# Patient Record
Sex: Male | Born: 1965
Health system: Southern US, Community
[De-identification: ages and names within clinical notes are randomized; demographics above are authoritative.]

## PROBLEM LIST (undated history)

## (undated) DIAGNOSIS — I5021 Acute systolic (congestive) heart failure: Secondary | ICD-10-CM

## (undated) DIAGNOSIS — D1779 Benign lipomatous neoplasm of other sites: Secondary | ICD-10-CM

## (undated) DIAGNOSIS — F419 Anxiety disorder, unspecified: Secondary | ICD-10-CM

## (undated) DIAGNOSIS — K259 Gastric ulcer, unspecified as acute or chronic, without hemorrhage or perforation: Secondary | ICD-10-CM

## (undated) DIAGNOSIS — M5430 Sciatica, unspecified side: Secondary | ICD-10-CM

## (undated) DIAGNOSIS — G4700 Insomnia, unspecified: Secondary | ICD-10-CM

## (undated) DIAGNOSIS — R809 Proteinuria, unspecified: Secondary | ICD-10-CM

## (undated) DIAGNOSIS — R0602 Shortness of breath: Secondary | ICD-10-CM

## (undated) DIAGNOSIS — I509 Heart failure, unspecified: Secondary | ICD-10-CM

## (undated) DIAGNOSIS — R42 Dizziness and giddiness: Secondary | ICD-10-CM

## (undated) DIAGNOSIS — I10 Essential (primary) hypertension: Secondary | ICD-10-CM

## (undated) DIAGNOSIS — K219 Gastro-esophageal reflux disease without esophagitis: Secondary | ICD-10-CM

## (undated) DIAGNOSIS — R2681 Unsteadiness on feet: Secondary | ICD-10-CM

## (undated) DIAGNOSIS — D72819 Decreased white blood cell count, unspecified: Secondary | ICD-10-CM

## (undated) DIAGNOSIS — I429 Cardiomyopathy, unspecified: Secondary | ICD-10-CM

## (undated) DIAGNOSIS — I639 Cerebral infarction, unspecified: Secondary | ICD-10-CM

## (undated) HISTORY — PX: OTHER SURGICAL HISTORY: SHX169

---

## 2003-07-26 ENCOUNTER — Emergency Department (HOSPITAL_COMMUNITY): Admission: EM | Admit: 2003-07-26 | Discharge: 2003-07-26 | Payer: Self-pay | Admitting: *Deleted

## 2003-08-03 ENCOUNTER — Encounter (HOSPITAL_COMMUNITY): Admission: RE | Admit: 2003-08-03 | Discharge: 2003-09-02 | Payer: Self-pay | Admitting: Preventative Medicine

## 2006-07-23 ENCOUNTER — Ambulatory Visit: Payer: Self-pay | Admitting: Family Medicine

## 2006-07-23 DIAGNOSIS — K219 Gastro-esophageal reflux disease without esophagitis: Secondary | ICD-10-CM

## 2006-07-23 DIAGNOSIS — I10 Essential (primary) hypertension: Secondary | ICD-10-CM | POA: Insufficient documentation

## 2006-08-20 ENCOUNTER — Ambulatory Visit: Payer: Self-pay | Admitting: Family Medicine

## 2006-08-20 DIAGNOSIS — R809 Proteinuria, unspecified: Secondary | ICD-10-CM | POA: Insufficient documentation

## 2006-08-20 LAB — CONVERTED CEMR LAB
Bilirubin Urine: NEGATIVE
Blood in Urine, dipstick: NEGATIVE
Glucose, Urine, Semiquant: NEGATIVE
Nitrite: NEGATIVE
WBC Urine, dipstick: NEGATIVE

## 2006-08-31 ENCOUNTER — Encounter (INDEPENDENT_AMBULATORY_CARE_PROVIDER_SITE_OTHER): Payer: Self-pay | Admitting: Family Medicine

## 2006-09-01 ENCOUNTER — Telehealth (INDEPENDENT_AMBULATORY_CARE_PROVIDER_SITE_OTHER): Payer: Self-pay | Admitting: *Deleted

## 2006-09-01 LAB — CONVERTED CEMR LAB
Albumin: 4.4 g/dL (ref 3.5–5.2)
Alkaline Phosphatase: 41 units/L (ref 39–117)
Basophils Relative: 0 % (ref 0–1)
CO2: 26 meq/L (ref 19–32)
Calcium: 9.5 mg/dL (ref 8.4–10.5)
Creatinine, Ser: 1.14 mg/dL (ref 0.40–1.50)
Eosinophils Absolute: 0.1 10*3/uL (ref 0.0–0.7)
Glucose, Bld: 86 mg/dL (ref 70–99)
Hemoglobin: 14.9 g/dL (ref 13.0–17.0)
MCHC: 34 g/dL (ref 30.0–36.0)
MCV: 92.2 fL (ref 78.0–100.0)
Monocytes Relative: 10 % (ref 3–11)
Neutro Abs: 1.8 10*3/uL (ref 1.7–7.7)
Neutrophils Relative %: 44 % (ref 43–77)
PSA: 0.72 ng/mL (ref 0.10–4.00)
Platelets: 225 10*3/uL (ref 150–400)
RDW: 12.8 % (ref 11.5–14.0)
TSH: 2.124 microintl units/mL (ref 0.350–5.50)
Total Bilirubin: 0.9 mg/dL (ref 0.3–1.2)
Triglycerides: 80 mg/dL (ref <150)
VLDL: 16 mg/dL (ref 0–40)
WBC: 4.2 10*3/uL (ref 4.0–10.5)

## 2006-09-02 ENCOUNTER — Encounter (INDEPENDENT_AMBULATORY_CARE_PROVIDER_SITE_OTHER): Payer: Self-pay | Admitting: Family Medicine

## 2006-09-11 ENCOUNTER — Ambulatory Visit: Payer: Self-pay | Admitting: Family Medicine

## 2006-09-11 LAB — CONVERTED CEMR LAB
HDL goal, serum: 40 mg/dL
LDL Goal: 130 mg/dL

## 2006-09-14 ENCOUNTER — Encounter (INDEPENDENT_AMBULATORY_CARE_PROVIDER_SITE_OTHER): Payer: Self-pay | Admitting: Family Medicine

## 2006-12-11 ENCOUNTER — Ambulatory Visit: Payer: Self-pay | Admitting: Family Medicine

## 2008-02-04 DIAGNOSIS — D1779 Benign lipomatous neoplasm of other sites: Secondary | ICD-10-CM

## 2008-02-04 HISTORY — DX: Benign lipomatous neoplasm of other sites: D17.79

## 2008-02-22 ENCOUNTER — Telehealth (INDEPENDENT_AMBULATORY_CARE_PROVIDER_SITE_OTHER): Payer: Self-pay | Admitting: *Deleted

## 2008-02-25 ENCOUNTER — Ambulatory Visit: Payer: Self-pay | Admitting: Family Medicine

## 2008-03-22 ENCOUNTER — Encounter (INDEPENDENT_AMBULATORY_CARE_PROVIDER_SITE_OTHER): Payer: Self-pay | Admitting: Family Medicine

## 2008-03-27 LAB — CONVERTED CEMR LAB
Alkaline Phosphatase: 50 units/L (ref 39–117)
BUN: 15 mg/dL (ref 6–23)
Basophils Relative: 0 % (ref 0–1)
Creatinine, Ser: 0.99 mg/dL (ref 0.40–1.50)
Glucose, Bld: 92 mg/dL (ref 70–99)
HDL: 47 mg/dL (ref >39)
Hemoglobin, Urine: NEGATIVE
LDL Cholesterol: 99 mg/dL (ref 0–99)
Leukocytes, UA: NEGATIVE
Lymphocytes Relative: 31 % (ref 12–46)
Lymphs Abs: 1.2 10*3/uL (ref 0.7–4.0)
MCHC: 33.2 g/dL (ref 30.0–36.0)
MCV: 95.6 fL (ref 78.0–100.0)
Monocytes Absolute: 0.4 10*3/uL (ref 0.1–1.0)
Monocytes Relative: 9 % (ref 3–12)
Nitrite: NEGATIVE
PSA: 1.37 ng/mL (ref 0.10–4.00)
Platelets: 263 10*3/uL (ref 150–400)
Protein, ur: NEGATIVE mg/dL
RDW: 13.4 % (ref 11.5–15.5)
Sodium: 139 meq/L (ref 135–145)
TSH: 0.817 microintl units/mL (ref 0.350–4.50)
Total Bilirubin: 0.9 mg/dL (ref 0.3–1.2)
Total CHOL/HDL Ratio: 3.3
Total Protein: 8.4 g/dL — ABNORMAL HIGH (ref 6.0–8.3)
Urobilinogen, UA: 0.2 (ref 0.0–1.0)
VLDL: 11 mg/dL (ref 0–40)

## 2008-03-28 ENCOUNTER — Ambulatory Visit: Payer: Self-pay | Admitting: Family Medicine

## 2008-03-28 LAB — CONVERTED CEMR LAB: LDL Goal: 160 mg/dL

## 2008-03-31 ENCOUNTER — Ambulatory Visit (HOSPITAL_COMMUNITY): Admission: RE | Admit: 2008-03-31 | Discharge: 2008-03-31 | Payer: Self-pay | Admitting: Family Medicine

## 2008-04-13 ENCOUNTER — Encounter (INDEPENDENT_AMBULATORY_CARE_PROVIDER_SITE_OTHER): Payer: Self-pay | Admitting: Family Medicine

## 2008-04-20 ENCOUNTER — Encounter: Payer: Self-pay | Admitting: Neurosurgery

## 2008-04-25 ENCOUNTER — Ambulatory Visit: Payer: Self-pay | Admitting: Family Medicine

## 2008-06-08 ENCOUNTER — Ambulatory Visit (HOSPITAL_COMMUNITY): Admission: RE | Admit: 2008-06-08 | Discharge: 2008-06-08 | Payer: Self-pay | Admitting: Interventional Radiology

## 2008-10-03 ENCOUNTER — Ambulatory Visit: Payer: Self-pay | Admitting: Family Medicine

## 2008-10-03 DIAGNOSIS — R7989 Other specified abnormal findings of blood chemistry: Secondary | ICD-10-CM | POA: Insufficient documentation

## 2008-10-03 DIAGNOSIS — D72819 Decreased white blood cell count, unspecified: Secondary | ICD-10-CM | POA: Insufficient documentation

## 2008-10-04 LAB — CONVERTED CEMR LAB
AST: 13 units/L (ref 0–37)
Albumin: 4.3 g/dL (ref 3.5–5.2)
Alkaline Phosphatase: 39 units/L (ref 39–117)
Basophils Relative: 0 % (ref 0–1)
Calcium: 9 mg/dL (ref 8.4–10.5)
Eosinophils Absolute: 0.1 10*3/uL (ref 0.0–0.7)
Eosinophils Relative: 2 % (ref 0–5)
Lymphocytes Relative: 43 % (ref 12–46)
Monocytes Absolute: 0.5 10*3/uL (ref 0.1–1.0)
Neutro Abs: 1.7 10*3/uL (ref 1.7–7.7)
Neutrophils Relative %: 42 % — ABNORMAL LOW (ref 43–77)
Platelets: 256 10*3/uL (ref 150–400)
Potassium: 4.1 meq/L (ref 3.5–5.3)
RDW: 12.6 % (ref 11.5–15.5)
Sodium: 139 meq/L (ref 135–145)
Total Bilirubin: 0.7 mg/dL (ref 0.3–1.2)
Total Protein: 7.7 g/dL (ref 6.0–8.3)
WBC: 4 10*3/uL (ref 4.0–10.5)

## 2008-10-13 ENCOUNTER — Encounter (INDEPENDENT_AMBULATORY_CARE_PROVIDER_SITE_OTHER): Payer: Self-pay | Admitting: Family Medicine

## 2009-03-18 ENCOUNTER — Emergency Department (HOSPITAL_COMMUNITY): Admission: EM | Admit: 2009-03-18 | Discharge: 2009-03-18 | Payer: Self-pay | Admitting: Emergency Medicine

## 2010-02-24 ENCOUNTER — Encounter: Payer: Self-pay | Admitting: Interventional Radiology

## 2010-04-24 LAB — BASIC METABOLIC PANEL
BUN: 13 mg/dL (ref 6–23)
Calcium: 10.3 mg/dL (ref 8.4–10.5)
GFR calc Af Amer: >60 mL/min (ref >60)
GFR calc non Af Amer: >60 mL/min (ref >60)
Glucose, Bld: 137 mg/dL — ABNORMAL HIGH (ref 70–99)
Potassium: 3.7 mEq/L (ref 3.5–5.1)

## 2010-04-24 LAB — CBC
MCHC: 34.3 g/dL (ref 30.0–36.0)
MCV: 93.5 fL (ref 78.0–100.0)
RBC: 4.97 MIL/uL (ref 4.22–5.81)
WBC: 10.7 10*3/uL — ABNORMAL HIGH (ref 4.0–10.5)

## 2010-04-24 LAB — DIFFERENTIAL
Basophils Absolute: 0 10*3/uL (ref 0.0–0.1)
Basophils Relative: 0 % (ref 0–1)
Eosinophils Absolute: 0 10*3/uL (ref 0.0–0.7)
Eosinophils Relative: 0 % (ref 0–5)
Lymphocytes Relative: 5 % — ABNORMAL LOW (ref 12–46)
Monocytes Absolute: 0.4 10*3/uL (ref 0.1–1.0)
Neutro Abs: 9.7 10*3/uL — ABNORMAL HIGH (ref 1.7–7.7)

## 2010-04-24 LAB — URINALYSIS, ROUTINE W REFLEX MICROSCOPIC
Bilirubin Urine: NEGATIVE
Ketones, ur: 15 mg/dL — AB
Nitrite: NEGATIVE
Protein, ur: 100 mg/dL — AB
Specific Gravity, Urine: >1.03 — ABNORMAL HIGH (ref 1.005–1.030)

## 2010-04-24 LAB — URINE MICROSCOPIC-ADD ON

## 2010-04-24 LAB — URINE CULTURE
Colony Count: NO GROWTH
Culture: NO GROWTH

## 2010-05-15 LAB — DIFFERENTIAL
Basophils Relative: 1 % (ref 0–1)
Eosinophils Absolute: 0 10*3/uL (ref 0.0–0.7)
Eosinophils Relative: 1 % (ref 0–5)
Neutro Abs: 1.9 10*3/uL (ref 1.7–7.7)
Neutrophils Relative %: 50 % (ref 43–77)

## 2010-05-15 LAB — BASIC METABOLIC PANEL
BUN: 12 mg/dL (ref 6–23)
Calcium: 10 mg/dL (ref 8.4–10.5)
Creatinine, Ser: 1.12 mg/dL (ref 0.4–1.5)
GFR calc Af Amer: >60 mL/min (ref >60)
GFR calc non Af Amer: >60 mL/min (ref >60)

## 2010-05-15 LAB — PROTIME-INR
INR: 1 (ref 0.00–1.49)
Prothrombin Time: 13.4 seconds (ref 11.6–15.2)

## 2010-05-15 LAB — CBC
HCT: 43.5 % (ref 39.0–52.0)
Platelets: 199 10*3/uL (ref 150–400)
RBC: 4.73 MIL/uL (ref 4.22–5.81)
RDW: 13.1 % (ref 11.5–15.5)
WBC: 3.8 10*3/uL — ABNORMAL LOW (ref 4.0–10.5)

## 2010-05-15 LAB — APTT: aPTT: 29 seconds (ref 24–37)

## 2010-06-18 NOTE — Consult Note (Signed)
Isaac Johnson                  ACCOUNT NO.:  [id number]   MEDICAL RECORD NO.:  [id number]          PATIENT TYPE:  OUT   LOCATION:  XRAY                         FACILITY:  [facility]   PHYSICIAN:  Sanjeev K. Deveshwar, M.D.DATE OF BIRTH:  11-02-1965   DATE OF CONSULTATION:  04/20/2008  DATE OF DISCHARGE:                                 CONSULTATION   CHIEF COMPLAINT:  Cerebral aneurysm.   HISTORY OF PRESENT ILLNESS:  This is a very pleasant 45 year old male  who was referred to Dr. Corliss Skains through the courtesy of Dr. Jordan Likes for  further evaluation of a cerebral aneurysm.  The patient has a history of  fatigue and gait instability times approximately 2 weeks.  He denies any  falls, but states that his balance has been poor although this does seem  to be improving.  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  The patient was referred to Dr. Jordan Likes  from Dr. Erby Pian.  Dr. Jordan Likes evaluated the patient and referred him to  Dr. Corliss Skains to discuss treatment options.   PAST MEDICAL HISTORY:  The patient has been very healthy.  He does have  hypertension which is treated with lisinopril and hydrochlorothiazide.  He has frequent indigestion and reports having a sensitive stomach,  otherwise he has been for the most part healthy.   PAST SURGICAL HISTORY:  The patient has had no major surgeries.  He has  never had general anesthesia.   ALLERGIES:  No known drug allergies.   CURRENT MEDICATIONS:  Lisinopril and hydrochlorothiazide 20/25 one  daily.   SOCIAL HISTORY:  The patient is married.  He has one daughter.  The  patient and his wife live in [place].  The patient has never smoked.  He  does not use alcohol.  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  There is some lifting involved but it is not  extremely strenuous.   FAMILY HISTORY:  His  mother is alive at age 23.  She has moyamoya  disease.  His father is alive at age 66.  He has no significant medical  illnesses.  The patient has an uncle on his mother's side of the family  who died from a ruptured aneurysm in his 42s.   IMPRESSION AND PLAN:  As noted, the patient presents today for further  discussions regarding a recently diagnosed cerebral aneurysm.  Aneurysms  were explained in detail along with the risks involved.  We also  discussed treatment options including continued monitoring versus open  craniotomy and clipping as well as endovascular treatment with coiling  and/or stenting.  The procedures were described in detail along with the  risks and benefits of each.   Dr. Corliss Skains reviewed the patient's recent MRI/MRA with the patient and  his wife.  He pointed out the areas of concern in the vertebral arteries  as well as the possible aneurysm.  Dr. Corliss Skains could not be certain  that this  did actually represent an aneurysm due to the limitations of  the study.  He also felt that the narrowing in the vertebral arteries  could actually represent artifact as well.  A cerebral angiogram has  been recommended for further evaluation.  The patient would like to  proceed with endovascular treatment if it is felt to be safe and  indicated at the time of the angiogram.  We will make these arrangements  and the patient will be scheduled sometime in April.  The patient and  his wife were given some written materials to study at home.  They were  encouraged to call if they had any further questions.  He was given a  prescription for Plavix which he will start 3 days prior to the  intervention.  He will also start aspirin 325 mg daily 3 days prior to  the intervention.  They were told to call in the interim with any  questions.   TIME SPENT:  Greater than 1 hour was spent on this consult.      Delton See, P.A.    ______________________________  Grandville Silos.  Corliss Skains, M.D.    DR/MEDQ  D:  04/20/2008  T:  04/21/2008  Job:  [id number]   cc:   Franchot Heidelberg, M.D.

## 2011-07-08 ENCOUNTER — Encounter (HOSPITAL_COMMUNITY): Payer: Self-pay | Admitting: Emergency Medicine

## 2011-07-08 ENCOUNTER — Emergency Department (HOSPITAL_COMMUNITY)
Admission: EM | Admit: 2011-07-08 | Discharge: 2011-07-09 | Disposition: A | Discharge status: Home / Self Care | Payer: BC Managed Care – PPO | Attending: Emergency Medicine | Admitting: Emergency Medicine

## 2011-07-08 DIAGNOSIS — M79609 Pain in unspecified limb: Secondary | ICD-10-CM | POA: Insufficient documentation

## 2011-07-08 DIAGNOSIS — M549 Dorsalgia, unspecified: Secondary | ICD-10-CM | POA: Insufficient documentation

## 2011-07-08 DIAGNOSIS — M5430 Sciatica, unspecified side: Secondary | ICD-10-CM

## 2011-07-08 DIAGNOSIS — I10 Essential (primary) hypertension: Secondary | ICD-10-CM | POA: Insufficient documentation

## 2011-07-08 HISTORY — DX: Essential (primary) hypertension: I10

## 2011-07-08 HISTORY — DX: Gastric ulcer, unspecified as acute or chronic, without hemorrhage or perforation: K25.9

## 2011-07-08 LAB — URINALYSIS, ROUTINE W REFLEX MICROSCOPIC
Bilirubin Urine: NEGATIVE
Ketones, ur: NEGATIVE mg/dL
Nitrite: NEGATIVE
Protein, ur: NEGATIVE mg/dL
Specific Gravity, Urine: <1.005 — ABNORMAL LOW (ref 1.005–1.030)
Urobilinogen, UA: 0.2 mg/dL (ref 0.0–1.0)
pH: 7 (ref 5.0–8.0)

## 2011-07-08 MED ORDER — PREDNISONE 20 MG PO TABS
60.0000 mg | ORAL_TABLET | Freq: Once | ORAL | Status: AC
  Administered 2011-07-08: 60 mg via ORAL
  Filled 2011-07-08: qty 3

## 2011-07-08 MED ORDER — IBUPROFEN 800 MG PO TABS
800.0000 mg | ORAL_TABLET | Freq: Once | ORAL | Status: AC
  Administered 2011-07-08: 800 mg via ORAL
  Filled 2011-07-08: qty 1

## 2011-07-08 MED ORDER — DIAZEPAM 5 MG PO TABS
5.0000 mg | ORAL_TABLET | Freq: Once | ORAL | Status: AC
  Administered 2011-07-08: 5 mg via ORAL
  Filled 2011-07-08: qty 1

## 2011-07-08 MED ORDER — IBUPROFEN 800 MG PO TABS: 800.0000 mg | ORAL_TABLET | Freq: Three times a day (TID) | ORAL | Status: AC

## 2011-07-08 MED ORDER — DIAZEPAM 5 MG PO TABS: 5.0000 mg | ORAL_TABLET | Freq: Four times a day (QID) | ORAL | Status: AC | PRN

## 2011-07-08 MED ORDER — OXYCODONE-ACETAMINOPHEN 5-325 MG PO TABS
2.0000 | ORAL_TABLET | Freq: Once | ORAL | Status: AC
  Administered 2011-07-08: 2 via ORAL
  Filled 2011-07-08: qty 2

## 2011-07-08 MED ORDER — OXYCODONE-ACETAMINOPHEN 5-325 MG PO TABS: 2.0000 | ORAL_TABLET | ORAL | Status: AC | PRN

## 2011-07-08 MED ORDER — METHYLPREDNISOLONE 4 MG PO KIT: PACK | ORAL | Status: AC

## 2011-07-08 NOTE — ED Provider Notes (Signed)
History  This chart was scribed for Isaac Octave, MD by Bennett Scrape. This patient was seen in room APA03/APA03 and the patient's care was started at 9:31PM.  CSN: [id number]  Arrival date & time 07/08/11  2103   First MD Initiated Contact with Patient 07/08/11 2131      Chief Complaint  Patient presents with  . Leg Pain  . Back Pain    The history is provided by the patient. No language interpreter was used.    Isaac Johnson is a 46 y.o. male who presents to the Emergency Department complaining of 2 days of gradual onset, gradually worsening, constant lower back pain that radiates sharp shooting pain down the left knee after lifting "something heavy". The pain is worse with movement and bending. He has tried warm showers with no improvement in his symptoms. He reports taking Aleve with mild improvement in his symptoms. He also states that he experienced two episodes of falling as he was trying to get out of bed earlier. He states that the left leg "just gave out". He reports that he experienced one prior episode last year and was diagnosed with sciatica by his PCP. He states that the pain is similar but not as severe due to the fact that last time, the pain radiated all the way down his left leg to his toes. He denies fever, emesis, loss of bowels or bladder and difficulty urinating as associated symptoms. He has a h/o HTN and stomach ulcers. He denies smoking and alcohol use.  Dr. Bradly Bienenstock is PCP.   Past Medical History  Diagnosis Date  . Hypertension   . Stomach ulcer     History reviewed. No pertinent past surgical history.  History reviewed. No pertinent family history.  History  Substance Use Topics  . Smoking status: Never Smoker   . Smokeless tobacco: Not on file  . Alcohol Use: No      Review of Systems  A complete 10 system review of systems was obtained and all systems are negative except as noted in the HPI and PMH.   Allergies  Review of patient's  allergies indicates no known allergies.  Home Medications     Triage Vitals: BP 174/98  Pulse 70  Temp(Src) 98.5 F (36.9 C) (Oral)  Resp 14  Ht 6' (1.829 m)  Wt 185 lb (83.915 kg)  BMI 25.09 kg/m2  SpO2 100%  Physical Exam  Nursing note and vitals reviewed. Constitutional: He is oriented to person, place, and time. He appears well-developed and well-nourished. No distress.  HENT:  Head: Normocephalic and atraumatic.  Eyes: Conjunctivae and EOM are normal.  Neck: Neck supple. No tracheal deviation present.  Cardiovascular: Normal rate.   Pulmonary/Chest: Effort normal. No respiratory distress.  Abdominal: Soft. He exhibits no distension.  Musculoskeletal: Normal range of motion.       Good DP and PT pulses, good dorsi and plantar flexion, good great toe flexion, +1 patellar reflex on the left, +2 on the right, left paraspinal lumbar tenderness  Neurological: He is alert and oriented to person, place, and time.  Skin: Skin is warm and dry.  Psychiatric: He has a normal mood and affect. His behavior is normal.    ED Course  Procedures (including critical care time)  DIAGNOSTIC STUDIES: Oxygen Saturation is 100% on room air, normal by my interpretation.    COORDINATION OF CARE: 9:39PM-Discussed discharge plan of antiinflammatories and steroids with pt and pt agreed to plan.   Labs Reviewed  URINALYSIS, ROUTINE W REFLEX MICROSCOPIC - Abnormal; Notable for the following:    Specific Gravity, Urine <1.005 (*)    All other components within normal limits   No results found.   No diagnosis found.    MDM  3 days of lower back pain radiating to the left leg. Previous history of sciatica. No weakness, numbness, tingling, bowel or bladder incontinence, fever, chills or vomiting.pain radiates down left lateral leg to the knee. Previous episodes have extended all the way down the leg.  5 out of 5 strength throughout, great toe dorisflexion intact, ankle plantar and  dorsiflexion intact.  +2 DP and PT pulses. +2 patellar reflex on R, +1 on L.  Normal gait. Negative post void residual.  No neuro deficits or other red flags.  Will treat for sciatica.    I personally performed the services described in this documentation, which was scribed in my presence.  The recorded information has been reviewed and considered.    Isaac Octave, MD 07/08/11 [phone]

## 2011-07-08 NOTE — ED Notes (Signed)
Patient stated he "lifted something" on Sunday and his back started hurting. Left leg pain started yesterday.

## 2011-07-08 NOTE — Discharge Instructions (Signed)

## 2011-07-08 NOTE — ED Notes (Signed)
Post void 0ml recheck by RN same

## 2012-06-23 ENCOUNTER — Encounter (HOSPITAL_COMMUNITY): Payer: Self-pay

## 2012-06-23 ENCOUNTER — Emergency Department (HOSPITAL_COMMUNITY)
Admission: EM | Admit: 2012-06-23 | Discharge: 2012-06-23 | Disposition: A | Discharge status: Home / Self Care | Payer: BC Managed Care – PPO | Attending: Emergency Medicine | Admitting: Emergency Medicine

## 2012-06-23 ENCOUNTER — Emergency Department (HOSPITAL_COMMUNITY): Payer: BC Managed Care – PPO

## 2012-06-23 DIAGNOSIS — Z862 Personal history of diseases of the blood and blood-forming organs and certain disorders involving the immune mechanism: Secondary | ICD-10-CM | POA: Insufficient documentation

## 2012-06-23 DIAGNOSIS — R109 Unspecified abdominal pain: Secondary | ICD-10-CM

## 2012-06-23 DIAGNOSIS — Z79899 Other long term (current) drug therapy: Secondary | ICD-10-CM | POA: Insufficient documentation

## 2012-06-23 DIAGNOSIS — R1084 Generalized abdominal pain: Secondary | ICD-10-CM | POA: Insufficient documentation

## 2012-06-23 DIAGNOSIS — I10 Essential (primary) hypertension: Secondary | ICD-10-CM | POA: Insufficient documentation

## 2012-06-23 DIAGNOSIS — Z8719 Personal history of other diseases of the digestive system: Secondary | ICD-10-CM | POA: Insufficient documentation

## 2012-06-23 DIAGNOSIS — Z8669 Personal history of other diseases of the nervous system and sense organs: Secondary | ICD-10-CM | POA: Insufficient documentation

## 2012-06-23 DIAGNOSIS — Z8739 Personal history of other diseases of the musculoskeletal system and connective tissue: Secondary | ICD-10-CM | POA: Insufficient documentation

## 2012-06-23 DIAGNOSIS — K219 Gastro-esophageal reflux disease without esophagitis: Secondary | ICD-10-CM | POA: Insufficient documentation

## 2012-06-23 DIAGNOSIS — Z8639 Personal history of other endocrine, nutritional and metabolic disease: Secondary | ICD-10-CM | POA: Insufficient documentation

## 2012-06-23 DIAGNOSIS — R112 Nausea with vomiting, unspecified: Secondary | ICD-10-CM | POA: Insufficient documentation

## 2012-06-23 DIAGNOSIS — Z86011 Personal history of benign neoplasm of the brain: Secondary | ICD-10-CM | POA: Insufficient documentation

## 2012-06-23 HISTORY — DX: Sciatica, unspecified side: M54.30

## 2012-06-23 HISTORY — DX: Decreased white blood cell count, unspecified: D72.819

## 2012-06-23 HISTORY — DX: Insomnia, unspecified: G47.00

## 2012-06-23 HISTORY — DX: Proteinuria, unspecified: R80.9

## 2012-06-23 HISTORY — DX: Benign lipomatous neoplasm of other sites: D17.79

## 2012-06-23 HISTORY — DX: Unsteadiness on feet: R26.81

## 2012-06-23 HISTORY — DX: Gastro-esophageal reflux disease without esophagitis: K21.9

## 2012-06-23 HISTORY — DX: Dizziness and giddiness: R42

## 2012-06-23 LAB — LIPASE, BLOOD: Lipase: 48 U/L (ref 11–59)

## 2012-06-23 LAB — CBC WITH DIFFERENTIAL/PLATELET
Basophils Absolute: 0 10*3/uL (ref 0.0–0.1)
Basophils Relative: 0 % (ref 0–1)
Eosinophils Absolute: 0 10*3/uL (ref 0.0–0.7)
HCT: 41.2 % (ref 39.0–52.0)
Hemoglobin: 14.3 g/dL (ref 13.0–17.0)
MCH: 31.4 pg (ref 26.0–34.0)
MCHC: 34.7 g/dL (ref 30.0–36.0)
Monocytes Absolute: 0.4 10*3/uL (ref 0.1–1.0)
Monocytes Relative: 10 % (ref 3–12)
RDW: 12.3 % (ref 11.5–15.5)

## 2012-06-23 LAB — COMPREHENSIVE METABOLIC PANEL
Albumin: 3.8 g/dL (ref 3.5–5.2)
BUN: 11 mg/dL (ref 6–23)
Calcium: 8.5 mg/dL (ref 8.4–10.5)
Creatinine, Ser: 1.02 mg/dL (ref 0.50–1.35)
Total Protein: 7.3 g/dL (ref 6.0–8.3)

## 2012-06-23 LAB — URINALYSIS, ROUTINE W REFLEX MICROSCOPIC
Ketones, ur: NEGATIVE mg/dL
Leukocytes, UA: NEGATIVE
Nitrite: NEGATIVE
Specific Gravity, Urine: 1.015 (ref 1.005–1.030)
pH: 7 (ref 5.0–8.0)

## 2012-06-23 LAB — URINE MICROSCOPIC-ADD ON

## 2012-06-23 MED ORDER — MORPHINE SULFATE 4 MG/ML IJ SOLN
4.0000 mg | INTRAMUSCULAR | Status: DC | PRN
  Administered 2012-06-23: 4 mg via INTRAVENOUS
  Filled 2012-06-23: qty 1

## 2012-06-23 MED ORDER — OMEPRAZOLE 20 MG PO CPDR: 20.0000 mg | DELAYED_RELEASE_CAPSULE | Freq: Every day | ORAL | Status: DC

## 2012-06-23 MED ORDER — IOHEXOL 300 MG/ML  SOLN
100.0000 mL | Freq: Once | INTRAMUSCULAR | Status: AC | PRN
  Administered 2012-06-23: 100 mL via INTRAVENOUS

## 2012-06-23 MED ORDER — PANTOPRAZOLE SODIUM 40 MG IV SOLR
40.0000 mg | Freq: Once | INTRAVENOUS | Status: AC
  Administered 2012-06-23: 40 mg via INTRAVENOUS
  Filled 2012-06-23: qty 40

## 2012-06-23 MED ORDER — SODIUM CHLORIDE 0.9 % IV SOLN
INTRAVENOUS | Status: DC
Start: 2012-06-23 — End: 2012-06-24
  Administered 2012-06-23: 19:00:00 via INTRAVENOUS

## 2012-06-23 MED ORDER — ONDANSETRON HCL 4 MG/2ML IJ SOLN
4.0000 mg | INTRAMUSCULAR | Status: DC | PRN
  Administered 2012-06-23: 4 mg via INTRAVENOUS
  Filled 2012-06-23: qty 2

## 2012-06-23 MED ORDER — FAMOTIDINE IN NACL 20-0.9 MG/50ML-% IV SOLN
20.0000 mg | Freq: Once | INTRAVENOUS | Status: AC
  Administered 2012-06-23: 20 mg via INTRAVENOUS
  Filled 2012-06-23: qty 50

## 2012-06-23 MED ORDER — IOHEXOL 300 MG/ML  SOLN
50.0000 mL | Freq: Once | INTRAMUSCULAR | Status: AC | PRN
  Administered 2012-06-23: 50 mL via ORAL

## 2012-06-23 NOTE — ED Notes (Signed)
Clint Guy RN providing patient's care at this time.

## 2012-06-23 NOTE — ED Provider Notes (Signed)
History     CSN: [id number]  Arrival date & time 06/23/12  1702   First MD Initiated Contact with Patient 06/23/12 1715      Chief Complaint  Patient presents with  . Abdominal Pain     HPI Pt was seen at 1725.   Per pt, c/o gradual onset and persistence of constant generalized abd "pain" for the past 5 days.  Has been associated with several intermittent episodes of N/V.  Describes the abd pain as "cramping." States the pain began after he "ate some pizza."  States he has not taken his Prilosec in the past 2 days because he ran out. Denies diarrhea, no fevers, no back pain, no rash, no CP/SOB, no black or blood in stools or emesis.       Past Medical History  Diagnosis Date  . Hypertension   . Stomach ulcer   . Gait instability   . Proteinuria   . GERD (gastroesophageal reflux disease)   . Insomnia   . Dizziness   . Leukopenia   . Sciatica     left  . Brain lipoma 2010    Dr. Dutch Quint    History reviewed. No pertinent past surgical history.    History  Substance Use Topics  . Smoking status: Never Smoker   . Smokeless tobacco: Not on file  . Alcohol Use: No      Review of Systems ROS: Statement: All systems negative except as marked or noted in the HPI; Constitutional: Negative for fever and chills. ; ; Eyes: Negative for eye pain, redness and discharge. ; ; ENMT: Negative for ear pain, hoarseness, nasal congestion, sinus pressure and sore throat. ; ; Cardiovascular: Negative for chest pain, palpitations, diaphoresis, dyspnea and peripheral edema. ; ; Respiratory: Negative for cough, wheezing and stridor. ; ; Gastrointestinal: +abd pain, N/V. Negative for diarrhea, blood in stool, hematemesis, jaundice and rectal bleeding. . ; ; Genitourinary: Negative for dysuria, flank pain and hematuria. ; ; Musculoskeletal: Negative for back pain and neck pain. Negative for swelling and trauma.; ; Skin: Negative for pruritus, rash, abrasions, blisters, bruising and skin lesion.; ;  Neuro: Negative for headache, lightheadedness and neck stiffness. Negative for weakness, altered level of consciousness , altered mental status, extremity weakness, paresthesias, involuntary movement, seizure and syncope.       Allergies  Review of patient's allergies indicates no known allergies.  Home Medications   Current Outpatient Rx  Name  Route  Sig  Dispense  Refill  . hydrochlorothiazide (HYDRODIURIL) 25 MG tablet   Oral   Take 25 mg by mouth daily.         [name] losartan (COZAAR) 50 MG tablet   Oral   Take 50 mg by mouth daily.         . naproxen sodium (ALEVE) 220 MG tablet   Oral   Take 220 mg by mouth 2 (two) times daily as needed. pain         . omeprazole (PRILOSEC) 20 MG capsule   Oral   Take 20 mg by mouth daily.         [name] omeprazole (PRILOSEC) 20 MG capsule   Oral   Take 1 capsule (20 mg total) by mouth daily.   15 capsule   0     BP 167/96  Pulse 65  Temp(Src) 98.5 F (36.9 C) (Oral)  Resp 18  Ht 6' (1.829 m)  Wt 168 lb 7 oz (76.403 kg)  BMI 22.84 kg/m2  SpO2  99%  Physical Exam 1730: Physical examination:  Nursing notes reviewed; Vital signs and O2 SAT reviewed;  Constitutional: Well developed, Well nourished, Well hydrated, In no acute distress; Head:  Normocephalic, atraumatic; Eyes: EOMI, PERRL, No scleral icterus; ENMT: Mouth and pharynx normal, Mucous membranes moist; Neck: Supple, Full range of motion, No lymphadenopathy; Cardiovascular: Regular rate and rhythm, No murmur, rub, or gallop; Respiratory: Breath sounds clear & equal bilaterally, No rales, rhonchi, wheezes.  Speaking full sentences with ease, Normal respiratory effort/excursion; Chest: Nontender, Movement normal; Abdomen: Soft, +mild diffuse tenderness to palp. No rebound or guarding. Nondistended, Normal bowel sounds; Genitourinary: No CVA tenderness; Extremities: Pulses normal, No tenderness, No edema, No calf edema or asymmetry.; Neuro: AA&Ox3, Major CN grossly intact.   Speech clear. No gross focal motor or sensory deficits in extremities.; Skin: Color normal, Warm, Dry.   ED Course  Procedures     MDM  MDM Reviewed: previous chart, nursing note and vitals Reviewed previous: labs Interpretation: labs, x-ray and CT scan   Results for orders placed during the hospital encounter of 06/23/12  CBC WITH DIFFERENTIAL      Result Value Range   WBC 3.7 (*) 4.0 - 10.5 K/uL   RBC 4.55  4.22 - 5.81 MIL/uL   Hemoglobin 14.3  13.0 - 17.0 g/dL   HCT 16.1  09.6 - 04.5 %   MCV 90.5  78.0 - 100.0 fL   MCH 31.4  26.0 - 34.0 pg   MCHC 34.7  30.0 - 36.0 g/dL   RDW 40.9  81.1 - 91.4 %   Platelets 196  150 - 400 K/uL   Neutrophils Relative % 54  43 - 77 %   Neutro Abs 2.0  1.7 - 7.7 K/uL   Lymphocytes Relative 36  12 - 46 %   Lymphs Abs 1.3  0.7 - 4.0 K/uL   Monocytes Relative 10  3 - 12 %   Monocytes Absolute 0.4  0.1 - 1.0 K/uL   Eosinophils Relative 0  0 - 5 %   Eosinophils Absolute 0.0  0.0 - 0.7 K/uL   Basophils Relative 0  0 - 1 %   Basophils Absolute 0.0  0.0 - 0.1 K/uL  COMPREHENSIVE METABOLIC PANEL      Result Value Range   Sodium 136  135 - 145 mEq/L   Potassium 3.5  3.5 - 5.1 mEq/L   Chloride 100  96 - 112 mEq/L   CO2 28  19 - 32 mEq/L   Glucose, Bld 95  70 - 99 mg/dL   BUN 11  6 - 23 mg/dL   Creatinine, Ser 7.82  0.50 - 1.35 mg/dL   Calcium 8.5  8.4 - 95.6 mg/dL   Total Protein 7.3  6.0 - 8.3 g/dL   Albumin 3.8  3.5 - 5.2 g/dL   AST 17  0 - 37 U/L   ALT 12  0 - 53 U/L   Alkaline Phosphatase 43  39 - 117 U/L   Total Bilirubin 1.3 (*) 0.3 - 1.2 mg/dL   GFR calc non Af Amer 85 (*) >90 mL/min   GFR calc Af Amer >90  >90 mL/min  LIPASE, BLOOD      Result Value Range   Lipase 48  11 - 59 U/L  URINALYSIS, ROUTINE W REFLEX MICROSCOPIC      Result Value Range   Color, Urine YELLOW  YELLOW   APPearance CLEAR  CLEAR   Specific Gravity, Urine 1.015  1.005 - 1.030  pH 7.0  5.0 - 8.0   Glucose, UA NEGATIVE  NEGATIVE mg/dL   Hgb urine  dipstick NEGATIVE  NEGATIVE   Bilirubin Urine NEGATIVE  NEGATIVE   Ketones, ur NEGATIVE  NEGATIVE mg/dL   Protein, ur 30 (*) NEGATIVE mg/dL   Urobilinogen, UA 0.2  0.0 - 1.0 mg/dL   Nitrite NEGATIVE  NEGATIVE   Leukocytes, UA NEGATIVE  NEGATIVE  URINE MICROSCOPIC-ADD ON      Result Value Range   WBC, UA 0-2  <3 WBC/hpf   Bacteria, UA RARE  RARE   Dg Chest 2 View 06/23/2012   *RADIOLOGY REPORT*  Clinical Data: Chest/abdominal pain, weakness  CHEST - 2 VIEW  Comparison: 06/02/2008  Findings: Lungs are essentially clear.  No focal consolidation.  No pleural effusion or pneumothorax.  Nodular opacities overlying the bilateral lower lungs likely reflect nipple shadows.  The heart is normal in size.  Mild degenerative changes of the visualized thoracolumbar spine.  IMPRESSION: No evidence of acute cardiopulmonary disease.   Original Report Authenticated By: Charline Bills, M.D.   Ct Abdomen Pelvis W Contrast 06/23/2012   *RADIOLOGY REPORT*  Clinical Data: Abdominal pain, nausea, vomiting, history hypertension, ulcer disease  CT ABDOMEN AND PELVIS WITH CONTRAST  Technique:  Multidetector CT imaging of the abdomen and pelvis was performed following the standard protocol during bolus administration of intravenous contrast. Sagittal and coronal MPR images reconstructed from axial data set.  Contrast: OMNIPAQUE IOHEXOL 300 MG/ML  SOLN Dilute oral contrast.  Comparison: 03/18/2009  Findings: Lung bases clear. Liver, spleen, pancreas, kidneys, and adrenal glands normal appearance. Tiny umbilical hernia containing fat. Normal appendix. Normal-appearing bladder ureters with mild prostatic enlargement 5.4 x 4.4 cm. Stomach and bowel loops normal appearance. No mass, adenopathy, free fluid inflammatory process. Bones unremarkable.  IMPRESSION: No acute intra-abdominal or intrapelvic abnormalities. Tiny umbilical hernia. Mild prostatic enlargement.   Original Report Authenticated By: Ulyses Southward, M.D.     Results for JEOFFREY, ELEAZER (MRN [id number]) as of 06/23/2012 21:30  Ref. Range 03/22/2008 20:48 06/02/2008 11:00 10/03/2008 23:51 03/18/2009 14:09 06/23/2012 19:52  WBC Latest Range: 4.0-10.5 K/uL 3.8 (L) 3.8 (L) 4.0 10.7 (H) 3.7 (L)    2120:  Pt has tol PO well while in the ED without N/V.  No stooling while in the ED.  Abd benign, VSS. Wants to go home now. Dx and testing d/w pt and family.  Questions answered.  Verb understanding, agreeable to d/c home with outpt f/u.          Laray Anger, DO 06/25/12 1827

## 2012-06-23 NOTE — ED Notes (Signed)
Patient able to retain po fluids 

## 2012-06-23 NOTE — ED Notes (Signed)
Complain of abdominal pain. Nausea and vomiting. States he ate pizza over the weekend and that is when the pain started

## 2012-06-24 LAB — URINE CULTURE

## 2013-05-05 ENCOUNTER — Encounter (HOSPITAL_COMMUNITY): Payer: Self-pay | Admitting: Emergency Medicine

## 2013-05-05 ENCOUNTER — Inpatient Hospital Stay (HOSPITAL_COMMUNITY)
Admission: EM | Admit: 2013-05-05 | Discharge: 2013-05-12 | DRG: 064 | Disposition: A | Discharge status: Rehabilitation Facility | Payer: BC Managed Care – PPO | Attending: Neurology | Admitting: Neurology

## 2013-05-05 ENCOUNTER — Emergency Department (HOSPITAL_COMMUNITY): Payer: BC Managed Care – PPO

## 2013-05-05 DIAGNOSIS — M6281 Muscle weakness (generalized): Secondary | ICD-10-CM | POA: Diagnosis present

## 2013-05-05 DIAGNOSIS — I639 Cerebral infarction, unspecified: Secondary | ICD-10-CM

## 2013-05-05 DIAGNOSIS — K219 Gastro-esophageal reflux disease without esophagitis: Secondary | ICD-10-CM

## 2013-05-05 DIAGNOSIS — R29810 Facial weakness: Secondary | ICD-10-CM | POA: Diagnosis present

## 2013-05-05 DIAGNOSIS — I63519 Cerebral infarction due to unspecified occlusion or stenosis of unspecified middle cerebral artery: Secondary | ICD-10-CM | POA: Diagnosis present

## 2013-05-05 DIAGNOSIS — J9600 Acute respiratory failure, unspecified whether with hypoxia or hypercapnia: Secondary | ICD-10-CM | POA: Diagnosis not present

## 2013-05-05 DIAGNOSIS — I6359 Cerebral infarction due to unspecified occlusion or stenosis of other cerebral artery: Principal | ICD-10-CM | POA: Diagnosis present

## 2013-05-05 DIAGNOSIS — E876 Hypokalemia: Secondary | ICD-10-CM | POA: Diagnosis present

## 2013-05-05 DIAGNOSIS — I10 Essential (primary) hypertension: Secondary | ICD-10-CM

## 2013-05-05 DIAGNOSIS — R471 Dysarthria and anarthria: Secondary | ICD-10-CM | POA: Diagnosis present

## 2013-05-05 DIAGNOSIS — G8190 Hemiplegia, unspecified affecting unspecified side: Secondary | ICD-10-CM | POA: Diagnosis present

## 2013-05-05 DIAGNOSIS — Z9282 Status post administration of tPA (rtPA) in a different facility within the last 24 hours prior to admission to current facility: Secondary | ICD-10-CM

## 2013-05-05 DIAGNOSIS — I428 Other cardiomyopathies: Secondary | ICD-10-CM | POA: Diagnosis present

## 2013-05-05 DIAGNOSIS — I509 Heart failure, unspecified: Secondary | ICD-10-CM

## 2013-05-05 DIAGNOSIS — I672 Cerebral atherosclerosis: Secondary | ICD-10-CM | POA: Diagnosis present

## 2013-05-05 DIAGNOSIS — I498 Other specified cardiac arrhythmias: Secondary | ICD-10-CM | POA: Diagnosis not present

## 2013-05-05 DIAGNOSIS — Z823 Family history of stroke: Secondary | ICD-10-CM

## 2013-05-05 DIAGNOSIS — I6329 Cerebral infarction due to unspecified occlusion or stenosis of other precerebral arteries: Principal | ICD-10-CM | POA: Diagnosis present

## 2013-05-05 DIAGNOSIS — F411 Generalized anxiety disorder: Secondary | ICD-10-CM | POA: Diagnosis present

## 2013-05-05 DIAGNOSIS — I519 Heart disease, unspecified: Secondary | ICD-10-CM

## 2013-05-05 DIAGNOSIS — Y849 Medical procedure, unspecified as the cause of abnormal reaction of the patient, or of later complication, without mention of misadventure at the time of the procedure: Secondary | ICD-10-CM | POA: Diagnosis not present

## 2013-05-05 DIAGNOSIS — S301XXA Contusion of abdominal wall, initial encounter: Secondary | ICD-10-CM | POA: Diagnosis not present

## 2013-05-05 DIAGNOSIS — R259 Unspecified abnormal involuntary movements: Secondary | ICD-10-CM | POA: Diagnosis present

## 2013-05-05 DIAGNOSIS — I5022 Chronic systolic (congestive) heart failure: Secondary | ICD-10-CM

## 2013-05-05 HISTORY — DX: Heart failure, unspecified: I50.9

## 2013-05-05 NOTE — ED Notes (Signed)
Pt had gotten off of work at 2230, was driving home when had onset of right arm weakness and facial droop.  Pt is unable to use right arm and has slurred speech.

## 2013-05-05 NOTE — ED Provider Notes (Signed)
CSN: [id number]     Arrival date & time 05/05/13  2346 History  This chart was scribed for Isaac Speak, MD by Elby Beck, ED Scribe. This patient was seen in room APA16A/APA16A and the patient's care was started at 11:46 PM.   Chief Complaint  Patient presents with  . Code Stroke    The history is provided by the patient. No language interpreter was used.    HPI Comments: Isaac Johnson is a 48 y.o. Male with a history of HTN brought by EMS to the Emergency Department complaining of a sudden onset of right-sided facial droop onset about 1 hour ago, around 10:30 PM, after pt got off work. Pt reports associated slurred speech and right sided weakness. He states that he is not on any anticoagulants.   Past Medical History  Diagnosis Date  . Hypertension   . Stomach ulcer   . Gait instability   . Proteinuria   . GERD (gastroesophageal reflux disease)   . Insomnia   . Dizziness   . Leukopenia   . Sciatica     left  . Brain lipoma 2010    Dr. Trenton Gammon  . CHF (congestive heart failure)    History reviewed. No pertinent past surgical history. No family history on file. History  Substance Use Topics  . Smoking status: Never Smoker   . Smokeless tobacco: Not on file  . Alcohol Use: No    Review of Systems A complete 10 system review of systems was obtained and all systems are negative except as noted in the HPI and PMH.   Allergies  Review of patient's allergies indicates no known allergies.  Home Medications   Current Outpatient Rx  Name  Route  Sig  Dispense  Refill  . hydrochlorothiazide (HYDRODIURIL) 25 MG tablet   Oral   Take 25 mg by mouth daily.         [name] losartan (COZAAR) 50 MG tablet   Oral   Take 50 mg by mouth daily.         . naproxen sodium (ALEVE) 220 MG tablet   Oral   Take 220 mg by mouth 2 (two) times daily as needed. pain         . omeprazole (PRILOSEC) 20 MG capsule   Oral   Take 20 mg by mouth daily.         [name] omeprazole (PRILOSEC) 20  MG capsule   Oral   Take 1 capsule (20 mg total) by mouth daily.   15 capsule   0    Triage Vitals: BP 176/122  Pulse 111  Temp(Src) 97.6 F (36.4 C) (Oral)  Resp 20  Ht 6' (1.829 m)  Wt 175 lb (79.379 kg)  BMI 23.73 kg/m2  SpO2 100%  Physical Exam  Nursing note and vitals reviewed. Constitutional: He is oriented to person, place, and time. He appears well-developed and well-nourished. No distress.  HENT:  Head: Normocephalic and atraumatic.  Eyes: EOM are normal.  Neck: Neck supple. No tracheal deviation present.  Cardiovascular: Normal rate.   Pulmonary/Chest: Effort normal. No respiratory distress.  Musculoskeletal: Normal range of motion.  Neurological: He is alert and oriented to person, place, and time.  There is a right-sided facial droop noted along with slurred speech. There is a right-sided hemi-plegia present.  Skin: Skin is warm and dry.  Psychiatric: He has a normal mood and affect. His behavior is normal.    ED Course  Procedures (including critical care time)  DIAGNOSTIC STUDIES: Oxygen Saturation is 100% on RA, normal by my interpretation.    COORDINATION OF CARE: 11:55 AM- Pt advised of plan for treatment and pt agrees.  Medications  alteplase (ACTIVASE) 1 mg/mL injection (not administered)  labetalol (NORMODYNE,TRANDATE) injection 10 mg (not administered)   Labs Review Labs Reviewed  COMPREHENSIVE METABOLIC PANEL - Abnormal; Notable for the following:    Potassium 3.6 (*)    Glucose, Bld 133 (*)    Creatinine, Ser 1.64 (*)    Total Bilirubin 1.8 (*)    GFR calc non Af Amer 48 (*)    GFR calc Af Amer 55 (*)    All other components within normal limits  PROTIME-INR - Abnormal; Notable for the following:    Prothrombin Time 15.3 (*)    All other components within normal limits  CBC WITH DIFFERENTIAL  APTT   Imaging Review Ct Head Wo Contrast  05/06/2013   CLINICAL DATA:  Code stroke, right arm weakness.  EXAM: CT HEAD WITHOUT CONTRAST   TECHNIQUE: Contiguous axial images were obtained from the base of the skull through the vertex without intravenous contrast.  COMPARISON:  SP ANGIO/CAR/CERV BI dated 06/08/2008; MR MRA NECK WO/W CM dated 03/31/2008; MR HEAD WO/W CM dated 03/31/2008  FINDINGS: No acute large vascular territory infarct. No hemorrhage. No midline shift or mass effect.  Colpocephaly, with partial agenesis of the corpus callosum. No hydrocephalus, apparent absent ventricular septum.  Fatty mass within the supra cerebellar cistern again seen consistent with lipoma. Cerebellar tonsils are at but not below the foramen magnum. No abnormal extra-axial fluid collections.  Paranasal sinuses and mastoid air cells appear well-aerated. No skull fracture. Ocular globes and orbital contents are unremarkable.  IMPRESSION: No acute large vascular territory infarct or hemorrhage. Please note, for evaluation of acute ischemia, MRI with diffusion-weighted sequences would be more sensitive.  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  Findings discussed with and reconfirmed by Dr.Lydiah Pong on 05/06/2013 12:00 AM.   Electronically Signed   By: Elon Alas   On: 05/06/2013 00:09     EKG Interpretation None      MDM   Final diagnoses:  None    Patient is a 48 year old male brought for evaluation of strokelike symptoms. He got off work at 10:30 this evening and was driving home when he experienced difficulty speaking, facial numbness, and weakness of the right arm and leg. He was able to call 911 who brought him here for evaluation. He states that he was normal today at work. He arrived here well within the window for thrombolytics as a code stroke.  He was immediately evaluated by myself upon arrival. Labs were drawn and he was sent immediately for a CT scan of the head. This was unremarkable and showed no contraindications for TPA. I discussed the case with Dr. Leonel Ramsay from neurology at [facility]. His  recommendations were to initiate TPA once his blood pressure was at an acceptable level. He did require 10 mg of IV labetalol to obtain this and TPA was initiated. He was then transferred to [facility] for further evaluation by neurology. An attempt was made for teleneurology, however I was unable to get in touch with them and the decision for TPA was made between myself and Dr. Leonel Ramsay.  CRITICAL CARE Performed by: Isaac Johnson Total critical care time: 60 minutes Critical care time was exclusive of separately billable procedures and treating other patients. Critical care was necessary to treat  or prevent imminent or life-threatening deterioration. Critical care was time spent personally by me on the following activities: development of treatment plan with patient and/or surrogate as well as nursing, discussions with consultants, evaluation of patient's response to treatment, examination of patient, obtaining history from patient or surrogate, ordering and performing treatments and interventions, ordering and review of laboratory studies, ordering and review of radiographic studies, pulse oximetry and re-evaluation of patient's condition.   I personally performed the services described in this documentation, which was scribed in my presence. The recorded information has been reviewed and is accurate.     Isaac Speak, MD 05/06/13 [phone]

## 2013-05-06 ENCOUNTER — Encounter (HOSPITAL_COMMUNITY): Payer: Self-pay | Admitting: Certified Registered"

## 2013-05-06 ENCOUNTER — Inpatient Hospital Stay (HOSPITAL_COMMUNITY): Payer: BC Managed Care – PPO

## 2013-05-06 DIAGNOSIS — I63519 Cerebral infarction due to unspecified occlusion or stenosis of unspecified middle cerebral artery: Secondary | ICD-10-CM | POA: Diagnosis present

## 2013-05-06 DIAGNOSIS — I6350 Cerebral infarction due to unspecified occlusion or stenosis of unspecified cerebral artery: Secondary | ICD-10-CM

## 2013-05-06 DIAGNOSIS — I517 Cardiomegaly: Secondary | ICD-10-CM

## 2013-05-06 LAB — COMPREHENSIVE METABOLIC PANEL
ALT: 43 U/L (ref 0–53)
AST: 33 U/L (ref 0–37)
Albumin: 3.7 g/dL (ref 3.5–5.2)
Alkaline Phosphatase: 46 U/L (ref 39–117)
BUN: 20 mg/dL (ref 6–23)
CALCIUM: 9.4 mg/dL (ref 8.4–10.5)
CO2: 22 meq/L (ref 19–32)
CREATININE: 1.64 mg/dL — AB (ref 0.50–1.35)
Chloride: 102 mEq/L (ref 96–112)
GFR, EST AFRICAN AMERICAN: 55 mL/min — AB (ref >90)
GFR, EST NON AFRICAN AMERICAN: 48 mL/min — AB (ref >90)
Glucose, Bld: 133 mg/dL — ABNORMAL HIGH (ref 70–99)
Potassium: 3.6 mEq/L — ABNORMAL LOW (ref 3.7–5.3)
Sodium: 140 mEq/L (ref 137–147)
Total Bilirubin: 1.8 mg/dL — ABNORMAL HIGH (ref 0.3–1.2)
Total Protein: 7.1 g/dL (ref 6.0–8.3)

## 2013-05-06 LAB — CBC WITH DIFFERENTIAL/PLATELET
BASOS ABS: 0 10*3/uL (ref 0.0–0.1)
Basophils Relative: 0 % (ref 0–1)
EOS PCT: 0 % (ref 0–5)
Eosinophils Absolute: 0 10*3/uL (ref 0.0–0.7)
HCT: 43.9 % (ref 39.0–52.0)
Hemoglobin: 15.2 g/dL (ref 13.0–17.0)
Lymphocytes Relative: 33 % (ref 12–46)
Lymphs Abs: 1.8 10*3/uL (ref 0.7–4.0)
MCH: 32.2 pg (ref 26.0–34.0)
MCHC: 34.6 g/dL (ref 30.0–36.0)
MCV: 93 fL (ref 78.0–100.0)
MONO ABS: 0.4 10*3/uL (ref 0.1–1.0)
Monocytes Relative: 8 % (ref 3–12)
Neutro Abs: 3.2 10*3/uL (ref 1.7–7.7)
Neutrophils Relative %: 59 % (ref 43–77)
Platelets: 197 10*3/uL (ref 150–400)
RBC: 4.72 MIL/uL (ref 4.22–5.81)
RDW: 13.2 % (ref 11.5–15.5)
WBC: 5.5 10*3/uL (ref 4.0–10.5)

## 2013-05-06 LAB — MRSA PCR SCREENING: MRSA by PCR: NEGATIVE

## 2013-05-06 LAB — HEMOGLOBIN A1C
Hgb A1c MFr Bld: 5.8 % — ABNORMAL HIGH (ref <5.7)
MEAN PLASMA GLUCOSE: 120 mg/dL — AB (ref <117)

## 2013-05-06 LAB — LIPID PANEL
Cholesterol: 124 mg/dL (ref 0–200)
HDL: 37 mg/dL — AB (ref >39)
LDL Cholesterol: 74 mg/dL (ref 0–99)
Total CHOL/HDL Ratio: 3.4 RATIO
Triglycerides: 63 mg/dL (ref <150)
VLDL: 13 mg/dL (ref 0–40)

## 2013-05-06 LAB — PROTIME-INR
INR: 1.24 (ref 0.00–1.49)
PROTHROMBIN TIME: 15.3 s — AB (ref 11.6–15.2)

## 2013-05-06 LAB — BASIC METABOLIC PANEL
BUN: 18 mg/dL (ref 6–23)
CO2: 19 meq/L (ref 19–32)
Calcium: 8.6 mg/dL (ref 8.4–10.5)
Chloride: 106 mEq/L (ref 96–112)
Creatinine, Ser: 1.29 mg/dL (ref 0.50–1.35)
GFR calc Af Amer: 74 mL/min — ABNORMAL LOW (ref >90)
GFR calc non Af Amer: 64 mL/min — ABNORMAL LOW (ref >90)
Glucose, Bld: 77 mg/dL (ref 70–99)
POTASSIUM: 3.9 meq/L (ref 3.7–5.3)
SODIUM: 142 meq/L (ref 137–147)

## 2013-05-06 LAB — APTT: aPTT: 27 seconds (ref 24–37)

## 2013-05-06 MED ORDER — PANTOPRAZOLE SODIUM 40 MG PO TBEC: 40.0000 mg | DELAYED_RELEASE_TABLET | Freq: Every day | ORAL | Status: DC

## 2013-05-06 MED ORDER — STROKE: EARLY STAGES OF RECOVERY BOOK
Freq: Once | Status: AC
  Administered 2013-05-06: 03:00:00
  Filled 2013-05-06: qty 1

## 2013-05-06 MED ORDER — LABETALOL HCL 5 MG/ML IV SOLN
10.0000 mg | INTRAVENOUS | Status: DC | PRN
  Administered 2013-05-06 – 2013-05-10 (×11): 10 mg via INTRAVENOUS
  Filled 2013-05-06 (×11): qty 4

## 2013-05-06 MED ORDER — FENTANYL CITRATE 0.05 MG/ML IJ SOLN
INTRAMUSCULAR | Status: AC
  Filled 2013-05-06: qty 2

## 2013-05-06 MED ORDER — ONDANSETRON HCL 4 MG/2ML IJ SOLN
INTRAMUSCULAR | Status: AC
  Filled 2013-05-06: qty 2

## 2013-05-06 MED ORDER — NITROGLYCERIN 1 MG/10 ML FOR IR/CATH LAB
INTRA_ARTERIAL | Status: AC
  Administered 2013-05-07 (×6): 25 ug
  Filled 2013-05-06: qty 10

## 2013-05-06 MED ORDER — LABETALOL HCL 5 MG/ML IV SOLN
INTRAVENOUS | Status: AC
  Filled 2013-05-06: qty 4

## 2013-05-06 MED ORDER — IOHEXOL 300 MG/ML  SOLN
150.0000 mL | Freq: Once | INTRAMUSCULAR | Status: AC | PRN
  Administered 2013-05-06: 180 mL via INTRA_ARTERIAL

## 2013-05-06 MED ORDER — ONDANSETRON HCL 4 MG/2ML IJ SOLN
4.0000 mg | Freq: Four times a day (QID) | INTRAMUSCULAR | Status: DC | PRN
Start: 2013-05-06 — End: 2013-05-12
  Administered 2013-05-06 – 2013-05-10 (×3): 4 mg via INTRAVENOUS
  Filled 2013-05-06 (×3): qty 2

## 2013-05-06 MED ORDER — ACETAMINOPHEN 325 MG PO TABS: 650.0000 mg | ORAL_TABLET | ORAL | Status: DC | PRN

## 2013-05-06 MED ORDER — ACETAMINOPHEN 650 MG RE SUPP: 650.0000 mg | RECTAL | Status: DC | PRN

## 2013-05-06 MED ORDER — ENSURE COMPLETE PO LIQD
237.0000 mL | Freq: Two times a day (BID) | ORAL | Status: DC
  Administered 2013-05-08 – 2013-05-12 (×7): 237 mL via ORAL

## 2013-05-06 MED ORDER — PANTOPRAZOLE SODIUM 40 MG IV SOLR
40.0000 mg | Freq: Every day | INTRAVENOUS | Status: DC
  Filled 2013-05-06: qty 40

## 2013-05-06 MED ORDER — ALTEPLASE 100 MG IV SOLR
INTRAVENOUS | Status: AC
  Administered 2013-05-06: 72 mg
  Filled 2013-05-06: qty 1

## 2013-05-06 MED ORDER — HYDRALAZINE HCL 20 MG/ML IJ SOLN
INTRAMUSCULAR | Status: AC
  Filled 2013-05-06: qty 1

## 2013-05-06 MED ORDER — LABETALOL HCL 5 MG/ML IV SOLN
10.0000 mg | Freq: Once | INTRAVENOUS | Status: AC
  Administered 2013-05-06: 10 mg via INTRAVENOUS
  Filled 2013-05-06: qty 4

## 2013-05-06 MED ORDER — MIDAZOLAM HCL 2 MG/2ML IJ SOLN
INTRAMUSCULAR | Status: AC
Start: 2013-05-06 — End: 2013-05-07
  Filled 2013-05-06: qty 2

## 2013-05-06 MED ORDER — FENTANYL CITRATE 0.05 MG/ML IJ SOLN
INTRAMUSCULAR | Status: AC | PRN
  Administered 2013-05-06: 25 ug via INTRAVENOUS

## 2013-05-06 MED ORDER — SODIUM CHLORIDE 0.9 % IV SOLN
INTRAVENOUS | Status: DC
  Administered 2013-05-06: 1000 mL via INTRAVENOUS

## 2013-05-06 MED ORDER — MIDAZOLAM HCL 2 MG/2ML IJ SOLN
INTRAMUSCULAR | Status: AC | PRN
  Administered 2013-05-06: 1 mg via INTRAVENOUS

## 2013-05-06 MED ORDER — SODIUM CHLORIDE 0.9 % IV BOLUS (SEPSIS)
500.0000 mL | Freq: Once | INTRAVENOUS | Status: AC
  Administered 2013-05-06: 500 mL via INTRAVENOUS

## 2013-05-06 MED ORDER — LORAZEPAM 1 MG PO TABS
1.0000 mg | ORAL_TABLET | Freq: Four times a day (QID) | ORAL | Status: DC | PRN
  Administered 2013-05-08 – 2013-05-12 (×8): 1 mg via ORAL
  Filled 2013-05-06 (×8): qty 1

## 2013-05-06 MED ORDER — LABETALOL HCL 5 MG/ML IV SOLN
5.0000 mg | Freq: Once | INTRAVENOUS | Status: AC
  Administered 2013-05-06: 5 mg via INTRAVENOUS

## 2013-05-06 MED FILL — Alteplase For Inj 100 MG: INTRAVENOUS | Qty: 72 | Status: AC

## 2013-05-06 NOTE — Progress Notes (Signed)
Spoke with Burnetta Sabin, NP and Dr. Leonie Man about pt getting ASA by midnight tonight d/t admission at [facility] being at 1146 on 4/2.  Tpa given at 0104 this am.  Dr. Leonie Man stated to have imaging done tonight and call for aspirin before midnight tonight.  Will pass along to night shift RN.  MRI scheduled at 2030.  Will continue to monitor pt.

## 2013-05-06 NOTE — Progress Notes (Signed)
VASCULAR LAB PRELIMINARY  PRELIMINARY  PRELIMINARY  PRELIMINARY  Carotid duplex  completed.    Preliminary report:  Bilateral:  1-39% ICA stenosis.  Vertebral artery flow is antegrade.      Yvett Rossel, RVT 05/06/2013, 12:53 PM
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
## 2013-05-06 NOTE — Progress Notes (Signed)
TPA finished at 0204. Line flushed with 250cc NS. Will continue to monitor patient closely.

## 2013-05-06 NOTE — Progress Notes (Signed)
  Echocardiogram 2D Echocardiogram has been performed.  [place], [facility] 05/06/2013, 10:54 AM

## 2013-05-06 NOTE — Progress Notes (Signed)
PT Cancellation Note  Patient Details Name: Isaac Johnson MRN: [id number] DOB: [birth year]   Cancelled Treatment:    Reason Eval/Treat Not Completed: Patient not medically ready.  Order for start 4/4.  Will f/u tomorrow.     Marifer Hurd, Thornton Papas 05/06/2013, 7:02 AM
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
## 2013-05-06 NOTE — Progress Notes (Signed)
Patient improved to the point that he had no residual deficits other than mild dysarthria earlier today. Then approximately 22:10 shortly after he had sudden onset return of a dense right hemiparesis. At this point, he is not a candidate for intravenous tpa due to symptoms yesterday, but with an imaging proven large vessel occlusion and sudden return of symptoms, I do think that an attempt at intra-arterial therapy would be warrented.   I have discussed this with Dr. Estanislado Pandy and he will be taken for angiogram.   Roland Rack, MD [facility] [phone]  If 7pm- 7am, please page neurology on call as listed in [place].

## 2013-05-06 NOTE — Progress Notes (Signed)
Utilization review completed. Letroy Vazguez, RN, BSN. 

## 2013-05-06 NOTE — Progress Notes (Signed)
INITIAL NUTRITION ASSESSMENT  DOCUMENTATION CODES Per approved criteria  -Not Applicable   INTERVENTION: Ensure Complete po BID, each supplement provides 350 kcal and 13 grams of protein  NUTRITION DIAGNOSIS: Unintentional weight loss related to decreased appetite as evidenced by per pt report of 3% weight loss x 1 month.   Goal: Pt to meet >/= 90% of their estimated nutrition needs   Monitor:  Diet tolerance, supplement acceptance, PO intake, weight trend  Reason for Assessment: Pt identified as at nutrition risk on the Malnutrition Screen Tool  48 y.o. male  Admitting Dx: <principal problem not specified>  ASSESSMENT: Admitted to [facility] d/t right-sided weakness, given tPA and transferred to [facility].  Pt feeling better and reports that he had started to lose weight over the last month due to decreased appetite. Pt states that instead of taking a meal to work (sandwich, chips, and drink) pt has only been eating a snack at work (chips and a drink). Pt unsure why is appetite is decreased. Pt's diet advanced this am but has not had a meal yet.  Pt has lost a small amount of weight PTA and does have some mild muscle wasting present in clavicles and temples, however this does not currently meet criteria for malnutrition.   Nutrition Focused Physical Exam:  Subcutaneous Fat:  Orbital Region: WNL Upper Arm Region: WNL Thoracic and Lumbar Region: WNL  Muscle:  Temple Region: mild wasting Clavicle Bone Region: mild wasting Clavicle and Acromion Bone Region: WNL Scapular Bone Region: WNL Dorsal Hand: WNL Patellar Region: WNL Anterior Thigh Region: WNL Posterior Calf Region: WNL  Edema: not present   Height: Ht Readings from Last 1 Encounters:  05/06/13 6' (1.829 m)    Weight: Wt Readings from Last 1 Encounters:  05/06/13 175 lb (79.379 kg)    Ideal Body Weight: 80.9 kg   % Ideal Body Weight: 98%  Wt Readings from Last 10 Encounters:  05/06/13 175 lb (79.379 kg)   06/23/12 168 lb 7 oz (76.403 kg)  07/08/11 185 lb (83.915 kg)  10/03/08 183 lb (83.008 kg)  04/25/08 183 lb (83.008 kg)  03/28/08 185 lb (83.915 kg)  02/25/08 185 lb (83.915 kg)  12/11/06 176 lb (79.833 kg)  09/11/06 183 lb (83.008 kg)  08/20/06 183 lb (83.008 kg)    Usual Body Weight: 180 lb   % Usual Body Weight: 97%  BMI:  Body mass index is 23.73 kg/(m^2).  Estimated Nutritional Needs: Kcal: 2000-2200 Protein: 90-100 grams Fluid: > 2 L/day  Skin: WNL  Diet Order: Dysphagia 3 with Thin Liquids  EDUCATION NEEDS: -No education needs identified at this time   Intake/Output Summary (Last 24 hours) at 05/06/13 1118 Last data filed at 05/06/13 0900  Gross per 24 hour  Intake    700 ml  Output    925 ml  Net   -225 ml    Last BM: PTA   Labs:   Recent Labs Lab 05/05/13 2352 05/06/13 0520  NA 140 142  K 3.6* 3.9  CL 102 106  CO2 22 19  BUN 20 18  CREATININE 1.64* 1.29  CALCIUM 9.4 8.6  GLUCOSE 133* 77    CBG (last 3)  No results found for this basename: GLUCAP,  in the last 72 hours  Scheduled Meds: . pantoprazole (PROTONIX) IV  40 mg Intravenous QHS    Continuous Infusions: . sodium chloride 100 mL/hr at 05/06/13 0900    Past Medical History  Diagnosis Date  . Hypertension   .  Stomach ulcer   . Gait instability   . Proteinuria   . GERD (gastroesophageal reflux disease)   . Insomnia   . Dizziness   . Leukopenia   . Sciatica     left  . Brain lipoma 2010    Dr. Trenton Gammon  . CHF (congestive heart failure)     History reviewed. No pertinent past surgical history.  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

## 2013-05-06 NOTE — Sedation Documentation (Signed)
Access obtained.

## 2013-05-06 NOTE — Progress Notes (Signed)
Pt started feeling nauseous and coughing after sitting up to eat dinner.  Pt stated he does this at home and it is d/t his reflux.  Called Dr. Nicole Kindred to make him aware.  4mg  Zofran ordered q6hr prn.  1mg  Ativan ordered q6hr prn.  Will continue to monitor pt.  Neurologically the same.

## 2013-05-07 ENCOUNTER — Inpatient Hospital Stay (HOSPITAL_COMMUNITY): Payer: BC Managed Care – PPO

## 2013-05-07 ENCOUNTER — Inpatient Hospital Stay: Admit: 2013-05-07 | Payer: Self-pay | Admitting: Interventional Radiology

## 2013-05-07 ENCOUNTER — Encounter (HOSPITAL_COMMUNITY)
Admission: EM | Disposition: A | Discharge status: Rehabilitation Facility | Payer: Self-pay | Source: Home / Self Care | Attending: Neurology

## 2013-05-07 ENCOUNTER — Encounter (HOSPITAL_COMMUNITY): Payer: BC Managed Care – PPO | Admitting: Certified Registered Nurse Anesthetist

## 2013-05-07 ENCOUNTER — Inpatient Hospital Stay (HOSPITAL_COMMUNITY): Payer: BC Managed Care – PPO | Admitting: Certified Registered Nurse Anesthetist

## 2013-05-07 DIAGNOSIS — Z48812 Encounter for surgical aftercare following surgery on the circulatory system: Secondary | ICD-10-CM

## 2013-05-07 HISTORY — PX: RADIOLOGY WITH ANESTHESIA: SHX6223

## 2013-05-07 LAB — POCT I-STAT 3, ART BLOOD GAS (G3+)
Acid-base deficit: 3 mmol/L — ABNORMAL HIGH (ref 0.0–2.0)
Bicarbonate: 20.3 mEq/L (ref 20.0–24.0)
O2 Saturation: 97 %
PH ART: 7.441 (ref 7.350–7.450)
TCO2: 21 mmol/L (ref 0–100)
pCO2 arterial: 29.7 mmHg — ABNORMAL LOW (ref 35.0–45.0)
pO2, Arterial: 87 mmHg (ref 80.0–100.0)

## 2013-05-07 LAB — CBC WITH DIFFERENTIAL/PLATELET
Basophils Absolute: 0 10*3/uL (ref 0.0–0.1)
Basophils Relative: 0 % (ref 0–1)
EOS PCT: 0 % (ref 0–5)
Eosinophils Absolute: 0 10*3/uL (ref 0.0–0.7)
HCT: 40.6 % (ref 39.0–52.0)
Hemoglobin: 13.5 g/dL (ref 13.0–17.0)
LYMPHS PCT: 29 % (ref 12–46)
Lymphs Abs: 1.3 10*3/uL (ref 0.7–4.0)
MCH: 31.8 pg (ref 26.0–34.0)
MCHC: 33.3 g/dL (ref 30.0–36.0)
MCV: 95.5 fL (ref 78.0–100.0)
Monocytes Absolute: 0.4 10*3/uL (ref 0.1–1.0)
Monocytes Relative: 10 % (ref 3–12)
NEUTROS ABS: 2.8 10*3/uL (ref 1.7–7.7)
NEUTROS PCT: 61 % (ref 43–77)
PLATELETS: 169 10*3/uL (ref 150–400)
RBC: 4.25 MIL/uL (ref 4.22–5.81)
RDW: 13.9 % (ref 11.5–15.5)
WBC: 4.6 10*3/uL (ref 4.0–10.5)

## 2013-05-07 LAB — BASIC METABOLIC PANEL
BUN: 14 mg/dL (ref 6–23)
CHLORIDE: 108 meq/L (ref 96–112)
CO2: 16 meq/L — AB (ref 19–32)
Calcium: 7.7 mg/dL — ABNORMAL LOW (ref 8.4–10.5)
Creatinine, Ser: 1.23 mg/dL (ref 0.50–1.35)
GFR calc Af Amer: 78 mL/min — ABNORMAL LOW (ref >90)
GFR calc non Af Amer: 67 mL/min — ABNORMAL LOW (ref >90)
GLUCOSE: 77 mg/dL (ref 70–99)
POTASSIUM: 4.6 meq/L (ref 3.7–5.3)
SODIUM: 144 meq/L (ref 137–147)

## 2013-05-07 LAB — POCT ACTIVATED CLOTTING TIME: ACTIVATED CLOTTING TIME: 149 s

## 2013-05-07 SURGERY — RADIOLOGY WITH ANESTHESIA
Anesthesia: General

## 2013-05-07 MED ORDER — ACETAMINOPHEN 500 MG PO TABS: 1000.0000 mg | ORAL_TABLET | Freq: Four times a day (QID) | ORAL | Status: DC | PRN

## 2013-05-07 MED ORDER — ONDANSETRON HCL 4 MG/2ML IJ SOLN: 4.0000 mg | Freq: Once | INTRAMUSCULAR | Status: DC | PRN

## 2013-05-07 MED ORDER — NITROGLYCERIN 1 MG/10 ML FOR IR/CATH LAB
INTRA_ARTERIAL | Status: AC
  Filled 2013-05-07: qty 10

## 2013-05-07 MED ORDER — DEXTROSE 5 % IV SOLN
10.0000 mg | INTRAVENOUS | Status: DC | PRN
  Administered 2013-05-07: 50 ug/min via INTRAVENOUS

## 2013-05-07 MED ORDER — ASPIRIN EC 325 MG PO TBEC
DELAYED_RELEASE_TABLET | ORAL | Status: AC
  Filled 2013-05-07: qty 2

## 2013-05-07 MED ORDER — ACETAMINOPHEN 650 MG RE SUPP: 650.0000 mg | Freq: Four times a day (QID) | RECTAL | Status: DC | PRN

## 2013-05-07 MED ORDER — ASPIRIN EC 325 MG PO TBEC: 650.0000 mg | DELAYED_RELEASE_TABLET | Freq: Once | ORAL | Status: DC

## 2013-05-07 MED ORDER — HEPARIN SODIUM (PORCINE) 1000 UNIT/ML IJ SOLN
INTRAMUSCULAR | Status: DC | PRN
  Administered 2013-05-07: 2000 [IU] via INTRAVENOUS

## 2013-05-07 MED ORDER — EPTIFIBATIDE 2 MG/ML IV SOLN
INTRAVENOUS | Status: AC
  Administered 2013-05-07: 1 mg
  Administered 2013-05-07: 3 mg
  Administered 2013-05-07: 1 mg
  Filled 2013-05-07: qty 10

## 2013-05-07 MED ORDER — CLOPIDOGREL BISULFATE 75 MG PO TABS
75.0000 mg | ORAL_TABLET | Freq: Every day | ORAL | Status: DC
  Administered 2013-05-07 – 2013-05-12 (×6): 75 mg via ORAL
  Filled 2013-05-07 (×7): qty 1

## 2013-05-07 MED ORDER — ONDANSETRON HCL 4 MG/2ML IJ SOLN
4.0000 mg | Freq: Four times a day (QID) | INTRAMUSCULAR | Status: DC | PRN
  Administered 2013-05-08: 4 mg via INTRAVENOUS

## 2013-05-07 MED ORDER — CLOPIDOGREL BISULFATE 75 MG PO TABS: 300.0000 mg | ORAL_TABLET | Freq: Once | ORAL | Status: DC

## 2013-05-07 MED ORDER — ROCURONIUM BROMIDE 100 MG/10ML IV SOLN
INTRAVENOUS | Status: DC | PRN
  Administered 2013-05-07 (×5): 20 mg via INTRAVENOUS
  Administered 2013-05-07: 50 mg via INTRAVENOUS

## 2013-05-07 MED ORDER — CLOPIDOGREL BISULFATE 75 MG PO TABS
ORAL_TABLET | ORAL | Status: AC
  Filled 2013-05-07: qty 4

## 2013-05-07 MED ORDER — CHLORHEXIDINE GLUCONATE 0.12 % MT SOLN
15.0000 mL | Freq: Two times a day (BID) | OROMUCOSAL | Status: DC
  Administered 2013-05-07 – 2013-05-08 (×3): 15 mL via OROMUCOSAL
  Filled 2013-05-07 (×3): qty 15

## 2013-05-07 MED ORDER — SODIUM CHLORIDE 0.9 % IV SOLN
INTRAVENOUS | Status: DC
  Administered 2013-05-07 – 2013-05-09 (×4): via INTRAVENOUS

## 2013-05-07 MED ORDER — ASPIRIN 325 MG PO TABS
325.0000 mg | ORAL_TABLET | Freq: Every day | ORAL | Status: DC
  Administered 2013-05-07 – 2013-05-12 (×5): 325 mg via ORAL
  Filled 2013-05-07 (×7): qty 1

## 2013-05-07 MED ORDER — NICARDIPINE HCL IN NACL 20-0.86 MG/200ML-% IV SOLN
5.0000 mg/h | INTRAVENOUS | Status: DC
Start: 2013-05-07 — End: 2013-05-09
  Administered 2013-05-07: 5 mg/h via INTRAVENOUS
  Filled 2013-05-07: qty 200

## 2013-05-07 MED ORDER — PROPOFOL 10 MG/ML IV EMUL
5.0000 ug/kg/min | INTRAVENOUS | Status: DC
  Administered 2013-05-07: 50 ug/kg/min via INTRAVENOUS
  Filled 2013-05-07: qty 100

## 2013-05-07 MED ORDER — SODIUM CHLORIDE 0.9 % IV BOLUS (SEPSIS)
200.0000 mL | Freq: Once | INTRAVENOUS | Status: AC
  Administered 2013-05-07: 200 mL via INTRAVENOUS

## 2013-05-07 MED ORDER — SUCCINYLCHOLINE CHLORIDE 20 MG/ML IJ SOLN
INTRAMUSCULAR | Status: DC | PRN
  Administered 2013-05-07: 120 mg via INTRAVENOUS

## 2013-05-07 MED ORDER — BIOTENE DRY MOUTH MT LIQD
15.0000 mL | Freq: Four times a day (QID) | OROMUCOSAL | Status: DC
Start: 2013-05-07 — End: 2013-05-08
  Administered 2013-05-07 – 2013-05-08 (×6): 15 mL via OROMUCOSAL

## 2013-05-07 MED ORDER — LIDOCAINE HCL (CARDIAC) 20 MG/ML IV SOLN
INTRAVENOUS | Status: DC | PRN
  Administered 2013-05-07: 40 mg via INTRAVENOUS

## 2013-05-07 MED ORDER — HYDROMORPHONE HCL PF 1 MG/ML IJ SOLN: 0.2500 mg | INTRAMUSCULAR | Status: DC | PRN

## 2013-05-07 MED ORDER — FENTANYL CITRATE 0.05 MG/ML IJ SOLN
INTRAMUSCULAR | Status: DC | PRN
  Administered 2013-05-07 (×2): 50 ug via INTRAVENOUS
  Administered 2013-05-07: 100 ug via INTRAVENOUS

## 2013-05-07 MED ORDER — ACETAMINOPHEN 650 MG RE SUPP
650.0000 mg | RECTAL | Status: DC | PRN
  Administered 2013-05-07: 650 mg via RECTAL
  Filled 2013-05-07: qty 1

## 2013-05-07 MED ORDER — PROPOFOL 10 MG/ML IV BOLUS
INTRAVENOUS | Status: DC | PRN
  Administered 2013-05-07: 50 mg via INTRAVENOUS
  Administered 2013-05-07: 150 mg via INTRAVENOUS
  Administered 2013-05-07: 50 mg via INTRAVENOUS

## 2013-05-07 MED ORDER — ARTIFICIAL TEARS OP OINT
TOPICAL_OINTMENT | OPHTHALMIC | Status: DC | PRN
  Administered 2013-05-07: 1 via OPHTHALMIC

## 2013-05-07 NOTE — Progress Notes (Signed)
Day of Surgery  Subjective: CVA L MCA revascularization with clot retrieval and stent placement in IR with Dr Estanislado Pandy On vent Moving Left  Objective: Vital signs in last 24 hours: Temp:  [96.8 F (36 C)-98.4 F (36.9 C)] 98.1 F (36.7 C) (04/04 0800) Pulse Rate:  [26-102] 89 (04/04 0900) Resp:  [0-34] 11 (04/04 0900) BP: (82-160)/(11-125) 126/75 mmHg (04/04 0900) SpO2:  [91 %-100 %] 100 % (04/04 0900) Arterial Line BP: (128-154)/(69-102) 128/69 mmHg (04/04 0900) FiO2 (%):  [40 %] 40 % (04/04 0845)    Intake/Output from previous day: 04/03 0701 - 04/04 0700 In: 2442.8 [I.V.:2442.8] Out: 2380 [Urine:2380] Intake/Output this shift: Total I/O In: 103.6 [I.V.:103.6] Out: 625 [Urine:625]  PE:  Afeb; vss Moving Left to command No movement on Rt Does nod yes/no appropriately Answers all questions with nods On vent For extubation today per RN Rt/Lt groin sheaths intact No bleeding/ no hematoma 2+ pulses B feet Labs stable  Lab Results:   Recent Labs  05/05/13 2352 05/07/13 0635  WBC 5.5 4.6  HGB 15.2 13.5  HCT 43.9 40.6  PLT 197 169   BMET  Recent Labs  05/06/13 0520 05/07/13 0635  NA 142 144  K 3.9 4.6  CL 106 108  CO2 19 16*  GLUCOSE 77 77  BUN 18 14  CREATININE 1.29 1.23  CALCIUM 8.6 7.7*   PT/INR  Recent Labs  05/05/13 2352  LABPROT 15.3*  INR 1.24   ABG  Recent Labs  05/07/13 0602  PHART 7.441  HCO3 20.3    Studies/Results: Dg Chest 2 View  05/07/2013   CLINICAL DATA:  Recent stroke  EXAM: CHEST  2 VIEW  COMPARISON:  01/20/2013  FINDINGS: Cardiac shadow is enlarged. Vascular congestion and mild pulmonary edema is noted bilaterally. No focal confluent infiltrate is seen. No sizable effusion is noted.  IMPRESSION: Vascular congestion with mild pulmonary edema.   Electronically Signed   By: Inez Catalina M.D.   On: 05/07/2013 07:10   Ct Head Wo Contrast  05/07/2013   CLINICAL DATA:  Post endovascular stent placement.  EXAM: CT HEAD  WITHOUT CONTRAST  TECHNIQUE: Contiguous axial images were obtained from the base of the skull through the vertex without intravenous contrast.  COMPARISON:  Prior catheter directed angiogram performed earlier on the same day and MRI head from 05/06/2013  FINDINGS: Metallic stent is now seen within the left M1 segment. The stent extends from the supra clinoid aspect of the left internal carotid artery to the level of the MCA bifurcation.  Previously identified ischemic infarcts involving the left basal ganglia and left MCA territory are grossly stable. No new acute large vessel territory infarct. There is no intracranial hemorrhage. No mass lesion or midline shift.  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  No extra-axial fluid collection. No hydrocephalus. Calvarium is intact. Orbital soft tissues are normal. Paranasal sinuses and mastoid air cells are clear.  IMPRESSION: 1. Interval placement of endovascular stent within the left M1 segment. 2. Stable size and distribution of scattered ischemic infarcts involving the left basal ganglia and left MCA territory, not significantly changed relative to prior MRI from 05/06/2012. 3. No acute intracranial hemorrhage or new large vessel territory infarct identified.   Electronically Signed   By: Jeannine Boga M.D.   On: 05/07/2013 06:18   Ct Head Wo Contrast  05/06/2013   CLINICAL DATA:  Code stroke, right arm weakness.  EXAM: CT HEAD WITHOUT CONTRAST  TECHNIQUE: Contiguous axial images were obtained from the base of the skull through the vertex without intravenous contrast.  COMPARISON:  SP ANGIO/CAR/CERV BI dated 06/08/2008; MR MRA NECK WO/W CM dated 03/31/2008; MR HEAD WO/W CM dated 03/31/2008  FINDINGS: No acute large vascular territory infarct. No hemorrhage. No midline shift or mass effect.  Colpocephaly, with partial agenesis of the corpus  callosum. No hydrocephalus, apparent absent ventricular septum.  Fatty mass within the supra cerebellar cistern again seen consistent with lipoma. Cerebellar tonsils are at but not below the foramen magnum. No abnormal extra-axial fluid collections.  Paranasal sinuses and mastoid air cells appear well-aerated. No skull fracture. Ocular globes and orbital contents are unremarkable.  IMPRESSION: No acute large vascular territory infarct or hemorrhage. Please note, for evaluation of acute ischemia, MRI with diffusion-weighted sequences would be more sensitive.  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  Findings discussed with and reconfirmed by [facility] DELO on 05/06/2013 12:00 AM.   Electronically Signed   By: Elon Alas   On: 05/06/2013 00:09   Mr Brain Wo Contrast  05/06/2013   CLINICAL DATA:  Stroke, 24 hr status post tPA. Right-sided weakness.  EXAM: MRI HEAD WITHOUT CONTRAST  MRA HEAD WITHOUT CONTRAST  TECHNIQUE: Multiplanar, multiecho pulse sequences of the brain and surrounding structures were obtained without intravenous contrast. Angiographic images of the head were obtained using MRA technique without contrast.  COMPARISON:  Prior CT from 05/05/2013 and MRI from 03/31/2008.  FINDINGS: MRI HEAD FINDINGS  The CSF containing spaces are within normal limits for patient age. No focal parenchymal signal abnormality is identified. 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 No other mass lesion identified. There is no midline shift, or extra-axial fluid collection. Ventricles are normal in size without evidence of hydrocephalus. The absence of the septum pellucidum noted.  There is abnormal restricted diffusion involving the left basal ganglia, most prevalent within the posterior aspect of the putamen. There is some involvement of the periventricular white matter anterior to the atrium of the left lateral  ventricle. Scattered foci of restricted diffusion also seen within the insular cortex on the left bowel as well as the left temporoparietal region (series 3, image 19, 20). Additional small focus of restricted diffusion seen within the left centrum semi ovale of the left frontoparietal region (series 3, image 25). No evidence of hemorrhagic conversion. Tiny foci of hypointense signal intensity seen within the left putamen on GRE sequence likely represent associated petechial hemorrhages.  The cervicomedullary junction is normal. Pituitary gland is within normal limits. 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 Pituitary stalk is midline. The globes and optic nerves demonstrate a normal appearance with normal signal intensity. The  The bone marrow signal intensity is normal. Calvarium is intact. Visualized upper cervical spine is within normal limits.  Scalp soft tissues are unremarkable.  Paranasal sinuses are clear.  No mastoid effusion.  MRA HEAD FINDINGS  The visualized distal cervical, petrous, cavernous, and supra clinoid segments of the internal carotid arteries are widely patent bilaterally with antegrade flow. The left A1 segment is widely patent. The right A1 segment is hypoplastic. Anterior communicating artery is normal. Anterior cerebral arteries are well opacified bilaterally.  There is focal proximal occlusion of the left M1 segment (series 6, image 71). No opacification of the left MCA territory artery branches seen distally.  The right M1 segment is well opacified with widely patent antegrade flow. Distal right MCA territory branches are within normal limits.  The left vertebral artery is dominant. Vertebral arteries are well opacified with widely patent antegrade flow. Posterior inferior cerebral arteries are within normal limits. Vertebrobasilar junction and basilar artery are normal. Posterior cerebral arteries are well opacified. Superior cerebellar and anterior inferior cerebellar  arteries are normal.  No intracranial aneurysm identified.  IMPRESSION: MRI BRAIN:  1. 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 No evidence of hemorrhagic conversion. 2. 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 MRA BRAIN:  1. Complete occlusion of the proximal left M1 segment. 2. No other proximal branch occlusion or high-grade flow-limiting stenosis identified within the intracranial circulation. 3. No intracranial aneurysm.  Critical Value/emergent results were called by telephone at the time of interpretation on 05/06/2013 at 11:00 PM to Dr. Roland Rack , who verbally acknowledged these results.   Electronically Signed   By: Jeannine Boga M.D.   On: 05/06/2013 23:13   Dg Chest Port 1 View  05/07/2013   CLINICAL DATA:  Check endotracheal tube placement  EXAM: PORTABLE CHEST - 1 VIEW  COMPARISON:  05/06/2013  FINDINGS: An endotracheal tube is now seen 5.1 cm above the carina. A nasogastric catheter is noted although the tip is coned off than the inferior aspect of the film. Proximal side hole lies within the distal esophagus. Patchy atelectatic changes are noted  IMPRESSION: Status post endotracheal tube and nasogastric catheter placement. Patchy atelectatic changes are noted bilaterally.   Electronically Signed   By: Inez Catalina M.D.   On: 05/07/2013 08:53   Dg Abd Portable 1v  05/07/2013   CLINICAL DATA:  Orogastric tube insertion  EXAM: PORTABLE ABDOMEN - 1 VIEW  COMPARISON:  None.  FINDINGS: OG tube tip is in the proximal stomach. Side port at the GE junction. Normal bowel gas pattern. Dense left lower lobe collapse/consolidation noted obscuring the left hemidiaphragm.  IMPRESSION: OG tube proximal stomach.  This could be advanced 7 cm.  Dense left base consolidation/collapse   Electronically Signed   By: Daryll Brod M.D.   On: 05/07/2013 08:51   Mr Jodene Nam Head/brain Wo Cm  05/06/2013   CLINICAL DATA:  Stroke, 24  hr status post tPA. Right-sided weakness.  EXAM: MRI HEAD WITHOUT CONTRAST  MRA HEAD WITHOUT CONTRAST  TECHNIQUE: Multiplanar, multiecho pulse sequences of the brain and surrounding structures were obtained without intravenous contrast. Angiographic images of the head were obtained using MRA technique without contrast.  COMPARISON:  Prior CT from 05/05/2013 and MRI from 03/31/2008.  FINDINGS: MRI HEAD FINDINGS  The CSF containing spaces are within normal limits for patient age. No focal parenchymal signal abnormality is identified. 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 No other mass lesion identified. There is no midline shift, or extra-axial fluid collection. Ventricles are normal in size without evidence of hydrocephalus. The absence of the septum pellucidum noted.  There is abnormal restricted diffusion involving the left basal ganglia, most prevalent within the posterior aspect of the putamen. There is some involvement of the periventricular white matter anterior to the atrium of the left lateral ventricle. Scattered foci of restricted diffusion also seen within the insular cortex on the left bowel as well as the left temporoparietal region (series 3, image 19, 20). Additional small focus of restricted diffusion seen within the left  centrum semi ovale of the left frontoparietal region (series 3, image 25). No evidence of hemorrhagic conversion. Tiny foci of hypointense signal intensity seen within the left putamen on GRE sequence likely represent associated petechial hemorrhages.  The cervicomedullary junction is normal. Pituitary gland is within normal limits. 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 Pituitary stalk is midline. The globes and optic nerves demonstrate a normal appearance with normal signal intensity. The  The bone marrow signal intensity is normal. Calvarium is intact. Visualized upper cervical spine is within  normal limits.  Scalp soft tissues are unremarkable.  Paranasal sinuses are clear.  No mastoid effusion.  MRA HEAD FINDINGS  The visualized distal cervical, petrous, cavernous, and supra clinoid segments of the internal carotid arteries are widely patent bilaterally with antegrade flow. The left A1 segment is widely patent. The right A1 segment is hypoplastic. Anterior communicating artery is normal. Anterior cerebral arteries are well opacified bilaterally.  There is focal proximal occlusion of the left M1 segment (series 6, image 71). No opacification of the left MCA territory artery branches seen distally.  The right M1 segment is well opacified with widely patent antegrade flow. Distal right MCA territory branches are within normal limits.  The left vertebral artery is dominant. Vertebral arteries are well opacified with widely patent antegrade flow. Posterior inferior cerebral arteries are within normal limits. Vertebrobasilar junction and basilar artery are normal. Posterior cerebral arteries are well opacified. Superior cerebellar and anterior inferior cerebellar arteries are normal.  No intracranial aneurysm identified.  IMPRESSION: MRI BRAIN:  1. 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 No evidence of hemorrhagic conversion. 2. 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 MRA BRAIN:  1. Complete occlusion of the proximal left M1 segment. 2. No other proximal branch occlusion or high-grade flow-limiting stenosis identified within the intracranial circulation. 3. No intracranial aneurysm.  Critical Value/emergent results were called by telephone at the time of interpretation on 05/06/2013 at 11:00 PM to Dr. Roland Rack , who verbally acknowledged these results.   Electronically Signed   By: Jeannine Boga M.D.   On: 05/06/2013 23:13    Anti-infectives: Anti-infectives   None      Assessment/Plan: s/p  Procedure(s): RADIOLOGY WITH ANESTHESIA (N/A)  CVA L MCA revascularization 4/4 am Clot retrieval and stent placement On vent; for extub today Report to Dr Estanislado Pandy   LOS: 2 days    Ranee Peasley A 05/07/2013

## 2013-05-07 NOTE — Anesthesia Preprocedure Evaluation (Addendum)
Anesthesia Evaluation  Patient identified by MRN, date of birth, ID band Patient confused    Reviewed: Allergy & Precautions, H&P , NPO status , Patient's Chart, lab work & pertinent test results  Airway Mallampati: II TM Distance: >3 FB   Mouth opening: Limited Mouth Opening  Dental  (+) Teeth Intact, Poor Dentition   Pulmonary  breath sounds clear to auscultation        Cardiovascular hypertension, Pt. on medications +CHF Rhythm:Regular Rate:Normal  Echo: EF 25%   Neuro/Psych Acute right sided weakness; CODE STROKE  Neuromuscular disease CVA    GI/Hepatic PUD, GERD-  ,  Endo/Other    Renal/GU      Musculoskeletal   Abdominal   Peds  Hematology   Anesthesia Other Findings   Reproductive/Obstetrics                         Anesthesia Physical Anesthesia Plan  ASA: IV and emergent  Anesthesia Plan: General   Post-op Pain Management:    Induction: Intravenous  Airway Management Planned: Oral ETT  Additional Equipment: Arterial line  Intra-op Plan:   Post-operative Plan:   Informed Consent: I have reviewed the patients History and Physical, chart, labs and discussed the procedure including the risks, benefits and alternatives for the proposed anesthesia with the patient or authorized representative who has indicated his/her understanding and acceptance.     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

## 2013-05-07 NOTE — Progress Notes (Signed)
Discussed MRI results and change in Neuro status with Dr. Leonel Ramsay. Advised to hold Protonix and that Aspirin would not be ordered prior to patient going to Interventional Radiology.

## 2013-05-07 NOTE — Progress Notes (Addendum)
Right 9 fr arterial sheath removed at 1140 using exoseal device by [facility]. Manual pressure applied for 10 minutes. Right groin site level 1. Sheath intact. 4 french left femoral sheath removed at 1145 using manual pressure by Melchor Amour. Left groin site level 0. Sheath intact. Pedal pulses 2+ bilaterally. Possible pseudoaneurysm in Rt groin.  Dr. Estanislado Pandy aware and has ordered an u/s of the site.  Lt groin with no hematoma.  Sites reviewed by Noah Delaine and Melchor Amour with Viona Gilmore, RN.

## 2013-05-07 NOTE — Progress Notes (Signed)
PT Cancellation Note  Patient Details Name: Isaac Johnson MRN: [id number] DOB: 1965-04-17   Cancelled Treatment:    Reason Eval/Treat Not Completed: Patient not medically ready.  Patient on bedrest following angiogram and revascularization.  MD:  Please write activity orders when appropriate for patient.  PT will be initiated at that time.  Thank you!   Despina Pole 05/07/2013, 8:32 AM Carita Pian. Sanjuana Kava, [place] Pager [phone]

## 2013-05-07 NOTE — Sedation Documentation (Signed)
Care turned over to anesthesia 

## 2013-05-07 NOTE — Progress Notes (Signed)
VASCULAR LAB PRELIMINARY  PRELIMINARY  PRELIMINARY  PRELIMINARY  Right lower extremity ultrasound to rule out pseudoaneurysm completed.    Preliminary report:  There is no obvious evidence of pseudoaneurysm or AV fistula noted in the right lower extremity.  Isaac Johnson, RVT 05/07/2013, 5:22 PM

## 2013-05-07 NOTE — Procedures (Signed)
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

## 2013-05-07 NOTE — Progress Notes (Signed)
Pt called out complaining of numbness in his left leg from the sand bag. Performed the q30 minute check early and a large hematoma was noted at the left groin site measuring ~3 cm. Pressure was applied immediately beginning at 1255. Pedal pulses remain unchanged at 2+. After 20 minutes, the site was notably softer and no definite borders were palpated for a hematoma.

## 2013-05-07 NOTE — Sedation Documentation (Signed)
Recannulization of MCA
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
## 2013-05-07 NOTE — Progress Notes (Signed)
OT Cancellation Note  Patient Details Name: WILLIA LAMPERT MRN: [id number] DOB: 1965/03/12   Cancelled Treatment:    Reason Eval/Treat Not Completed: Patient not medically ready - pt currently on bedrest.  Will initiate OT eval once activity level increased.   Darlina Rumpf [place], OTR/L [phone] 05/07/2013, 8:42 AM

## 2013-05-07 NOTE — Evaluation (Signed)
SLP Cancellation Note  Patient Details Name: Isaac Johnson MRN: [id number] DOB: 06/15/1965   Cancelled treatment:       Reason Eval/Treat Not Completed: Medical issues which prohibited therapy (pt currently on vent, will follow for readiness for evaluation)   Claudie Fisherman, [place] [facility] SLP [phone]

## 2013-05-07 NOTE — Procedures (Signed)
**Note De-Identified Teodoro Jeffreys Obfuscation** Extubation Procedure Note  Patient Details:   Name: JUANDAVID DALLMAN DOB: 01-12-1966 MRN: [id number]   Airway Documentation:     Evaluation  O2 sats: stable throughout Complications: No apparent complications Patient did tolerate procedure well. Bilateral Breath Sounds: Clear;Diminished Suctioning: Airway Yes +LEAK, NO STRIDOR NOTED Cecile Gillispie, Penni Bombard 05/07/2013, 4:50 PM

## 2013-05-07 NOTE — Sedation Documentation (Signed)
Family updated as to patient's status.per DR D

## 2013-05-07 NOTE — Transfer of Care (Signed)
Immediate Anesthesia Transfer of Care Note  Patient: Isaac Johnson  Procedure(s) Performed: Procedure(s): RADIOLOGY WITH ANESTHESIA (N/A)  Patient Location: PACU and NICU  Anesthesia Type:General  Level of Consciousness: sedated, unresponsive and Patient remains intubated per anesthesia plan  Airway & Oxygen Therapy: Patient remains intubated per anesthesia plan and Patient placed on Ventilator (see vital sign flow sheet for setting)  Post-op Assessment: Report given to PACU RN and Post -op Vital signs reviewed and stable  Post vital signs: Reviewed and stable  Complications: No apparent anesthesia complications

## 2013-05-07 NOTE — Anesthesia Procedure Notes (Signed)
Procedure Name: Intubation Date/Time: 05/07/2013 12:48 AM Performed by: Hollie Salk Z Pre-anesthesia Checklist: Patient identified, Patient being monitored, Emergency Drugs available, Timeout performed and Suction available Patient Re-evaluated:Patient Re-evaluated prior to inductionOxygen Delivery Method: Circle system utilized Preoxygenation: Pre-oxygenation with 100% oxygen Intubation Type: IV induction and Cricoid Pressure applied Ventilation: Mask ventilation without difficulty Laryngoscope Size: Mac and 3 Grade View: Grade II Tube type: Subglottic suction tube Tube size: 8.0 mm Number of attempts: 2 Airway Equipment and Method: Stylet Placement Confirmation: ETT inserted through vocal cords under direct vision,  breath sounds checked- equal and bilateral and positive ETCO2 Secured at: 8 cm Tube secured with: Tape Dental Injury: Teeth and Oropharynx as per pre-operative assessment  Comments: Poor dentition

## 2013-05-07 NOTE — Anesthesia Postprocedure Evaluation (Signed)
  Anesthesia Post-op Note  Patient: Isaac Johnson  Procedure(s) Performed: Procedure(s): RADIOLOGY WITH ANESTHESIA (N/A)  Patient Location: NICU  Anesthesia Type:General  Level of Consciousness: sedated and unresponsive  Airway and Oxygen Therapy: placed on ventilator per anesthesia plan  Post-op Pain: none  Post-op Assessment: Post-op Vital signs reviewed, Patient's Cardiovascular Status Stable, Respiratory Function Stable, Patent Airway and Pain level controlled  Post-op Vital Signs: stable  Complications: No apparent anesthesia complications

## 2013-05-07 NOTE — Progress Notes (Signed)
Chaplain gave support to pt and pt's wife and friend.  Pt stated that he has begun to have some feeling in his right arm and leg, but it is still challenging to move r arm and leg.  Chaplain offered empathic listening and prayed with all in the room.   05/07/13 1900  Clinical Encounter Type  Visited With Patient and family together  Visit Type Spiritual support  Referral From Nurse  Spiritual Encounters  Spiritual Needs Prayer;Emotional  Stress Factors  Patient Stress Factors Health changes  Family Stress Factors Health changes    Estelle June, chaplain [phone]

## 2013-05-07 NOTE — Sedation Documentation (Signed)
MD at bedside.with Pts family d/w poc

## 2013-05-08 ENCOUNTER — Inpatient Hospital Stay (HOSPITAL_COMMUNITY): Payer: BC Managed Care – PPO

## 2013-05-08 MED ORDER — CARVEDILOL 3.125 MG PO TABS
3.1250 mg | ORAL_TABLET | Freq: Two times a day (BID) | ORAL | Status: DC
  Administered 2013-05-08 – 2013-05-12 (×9): 3.125 mg via ORAL
  Filled 2013-05-08 (×11): qty 1

## 2013-05-08 MED ORDER — PANTOPRAZOLE SODIUM 40 MG PO TBEC
80.0000 mg | DELAYED_RELEASE_TABLET | Freq: Every day | ORAL | Status: DC
  Administered 2013-05-08 – 2013-05-11 (×4): 80 mg via ORAL
  Filled 2013-05-08 (×4): qty 2

## 2013-05-08 MED ORDER — ASPIRIN 300 MG RE SUPP
300.0000 mg | Freq: Once | RECTAL | Status: AC
  Administered 2013-05-08: 300 mg via RECTAL

## 2013-05-08 MED ORDER — ALUM & MAG HYDROXIDE-SIMETH 200-200-20 MG/5ML PO SUSP
15.0000 mL | Freq: Four times a day (QID) | ORAL | Status: DC | PRN
  Administered 2013-05-08 (×2): 15 mL via ORAL
  Filled 2013-05-08 (×2): qty 30
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
## 2013-05-08 NOTE — Progress Notes (Cosign Needed)
1 Day Post-Op  Subjective: CVA L MCA revascularization 4/4 early am Developed hematoma Lt groin yesterday- reduced with pressure Extubated yesterday Better today  Objective: Vital signs in last 24 hours: Temp:  [97.5 F (36.4 C)-98.8 F (37.1 C)] 98.5 F (36.9 C) (04/05 0412) Pulse Rate:  [41-100] 75 (04/05 0800) Resp:  [12-32] 14 (04/05 0800) BP: (115-160)/(62-117) 120/66 mmHg (04/05 0800) SpO2:  [97 %-100 %] 100 % (04/05 0800) Arterial Line BP: (118-124)/(58-67) 122/67 mmHg (04/04 1130)    Intake/Output from previous day: 04/04 0701 - 04/05 0700 In: 2112.2 [I.V.:2112.2] Out: 5525 [Urine:5525] Intake/Output this shift:    PE:  Afeb; vss A/O; speech clear appropiate- answers appropriate Rt face droop; Rt smile droop Left with good movement; good strength Rt hand no movement Rt leg - moves knee to command- moves leg but unable to distintly move foot B pulses 2+ Rt groin NT no hematoma; [place] neg for pseudoaneurysm Lt groin NT; no hematoma Labs stable  Lab Results:   Recent Labs  05/05/13 2352 05/07/13 0635  WBC 5.5 4.6  HGB 15.2 13.5  HCT 43.9 40.6  PLT 197 169   BMET  Recent Labs  05/06/13 0520 05/07/13 0635  NA 142 144  K 3.9 4.6  CL 106 108  CO2 19 16*  GLUCOSE 77 77  BUN 18 14  CREATININE 1.29 1.23  CALCIUM 8.6 7.7*   PT/INR  Recent Labs  05/05/13 2352  LABPROT 15.3*  INR 1.24   ABG  Recent Labs  05/07/13 0602  PHART 7.441  HCO3 20.3    Studies/Results: Dg Chest 2 View  05/07/2013   CLINICAL DATA:  Recent stroke  EXAM: CHEST  2 VIEW  COMPARISON:  01/20/2013  FINDINGS: Cardiac shadow is enlarged. Vascular congestion and mild pulmonary edema is noted bilaterally. No focal confluent infiltrate is seen. No sizable effusion is noted.  IMPRESSION: Vascular congestion with mild pulmonary edema.   Electronically Signed   By: Alcide Clever M.D.   On: 05/07/2013 07:10   Ct Head Wo Contrast  05/07/2013   CLINICAL DATA:  Post endovascular stent  placement.  EXAM: CT HEAD WITHOUT CONTRAST  TECHNIQUE: Contiguous axial images were obtained from the base of the skull through the vertex without intravenous contrast.  COMPARISON:  Prior catheter directed angiogram performed earlier on the same day and MRI head from 05/06/2013  FINDINGS: Metallic stent is now seen within the left M1 segment. The stent extends from the supra clinoid aspect of the left internal carotid artery to the level of the MCA bifurcation.  Previously identified ischemic infarcts involving the left basal ganglia and left MCA territory are grossly stable. No new acute large vessel territory infarct. There is no intracranial hemorrhage. No mass lesion or midline shift.  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  No extra-axial fluid collection. No hydrocephalus. Calvarium is intact. Orbital soft tissues are normal. Paranasal sinuses and mastoid air cells are clear.  IMPRESSION: 1. Interval placement of endovascular stent within the left M1 segment. 2. Stable size and distribution of scattered ischemic infarcts involving the left basal ganglia and left MCA territory, not significantly changed relative to prior MRI from 05/06/2012. 3. No acute intracranial hemorrhage or new large vessel territory infarct identified.   Electronically Signed   By: Rise Mu M.D.   On: 05/07/2013 06:18   Mr Brain Wo Contrast  05/06/2013  CLINICAL DATA:  Stroke, 24 hr status post tPA. Right-sided weakness.  EXAM: MRI HEAD WITHOUT CONTRAST  MRA HEAD WITHOUT CONTRAST  TECHNIQUE: Multiplanar, multiecho pulse sequences of the brain and surrounding structures were obtained without intravenous contrast. Angiographic images of the head were obtained using MRA technique without contrast.  COMPARISON:  Prior CT from 05/05/2013 and MRI from 03/31/2008.  FINDINGS: MRI HEAD FINDINGS  The CSF  containing spaces are within normal limits for patient age. No focal parenchymal signal abnormality is identified. 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 No other mass lesion identified. There is no midline shift, or extra-axial fluid collection. Ventricles are normal in size without evidence of hydrocephalus. The absence of the septum pellucidum noted.  There is abnormal restricted diffusion involving the left basal ganglia, most prevalent within the posterior aspect of the putamen. There is some involvement of the periventricular white matter anterior to the atrium of the left lateral ventricle. Scattered foci of restricted diffusion also seen within the insular cortex on the left bowel as well as the left temporoparietal region (series 3, image 19, 20). Additional small focus of restricted diffusion seen within the left centrum semi ovale of the left frontoparietal region (series 3, image 25). No evidence of hemorrhagic conversion. Tiny foci of hypointense signal intensity seen within the left putamen on GRE sequence likely represent associated petechial hemorrhages.  The cervicomedullary junction is normal. Pituitary gland is within normal limits. 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 Pituitary stalk is midline. The globes and optic nerves demonstrate a normal appearance with normal signal intensity. The  The bone marrow signal intensity is normal. Calvarium is intact. Visualized upper cervical spine is within normal limits.  Scalp soft tissues are unremarkable.  Paranasal sinuses are clear.  No mastoid effusion.  MRA HEAD FINDINGS  The visualized distal cervical, petrous, cavernous, and supra clinoid segments of the internal carotid arteries are widely patent bilaterally with antegrade flow. The left A1 segment is widely patent. The right A1 segment is hypoplastic. Anterior communicating artery is normal. Anterior cerebral  arteries are well opacified bilaterally.  There is focal proximal occlusion of the left M1 segment (series 6, image 71). No opacification of the left MCA territory artery branches seen distally.  The right M1 segment is well opacified with widely patent antegrade flow. Distal right MCA territory branches are within normal limits.  The left vertebral artery is dominant. Vertebral arteries are well opacified with widely patent antegrade flow. Posterior inferior cerebral arteries are within normal limits. Vertebrobasilar junction and basilar artery are normal. Posterior cerebral arteries are well opacified. Superior cerebellar and anterior inferior cerebellar arteries are normal.  No intracranial aneurysm identified.  IMPRESSION: MRI BRAIN:  1. 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 No evidence of hemorrhagic conversion. 2. 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 MRA BRAIN:  1. Complete occlusion of the proximal left M1 segment. 2. No other proximal branch occlusion or high-grade flow-limiting stenosis identified within the intracranial circulation. 3. No intracranial aneurysm.  Critical Value/emergent results were called by telephone at the time of interpretation on 05/06/2013 at 11:00 PM to Dr. Roland Rack , who verbally acknowledged these results.   Electronically Signed   By: Jeannine Boga M.D.   On: 05/06/2013 23:13   Dg Chest Port 1 View  05/07/2013   CLINICAL DATA:  Check endotracheal tube placement  EXAM:  PORTABLE CHEST - 1 VIEW  COMPARISON:  05/06/2013  FINDINGS: An endotracheal tube is now seen 5.1 cm above the carina. A nasogastric catheter is noted although the tip is coned off than the inferior aspect of the film. Proximal side hole lies within the distal esophagus. Patchy atelectatic changes are noted  IMPRESSION: Status post endotracheal tube and nasogastric catheter placement. Patchy atelectatic changes are  noted bilaterally.   Electronically Signed   By: Inez Catalina M.D.   On: 05/07/2013 08:53   Dg Abd Portable 1v  05/07/2013   CLINICAL DATA:  Orogastric tube insertion  EXAM: PORTABLE ABDOMEN - 1 VIEW  COMPARISON:  None.  FINDINGS: OG tube tip is in the proximal stomach. Side port at the GE junction. Normal bowel gas pattern. Dense left lower lobe collapse/consolidation noted obscuring the left hemidiaphragm.  IMPRESSION: OG tube proximal stomach.  This could be advanced 7 cm.  Dense left base consolidation/collapse   Electronically Signed   By: Daryll Brod M.D.   On: 05/07/2013 08:51   Mr Jodene Nam Head/brain Wo Cm  05/06/2013   CLINICAL DATA:  Stroke, 24 hr status post tPA. Right-sided weakness.  EXAM: MRI HEAD WITHOUT CONTRAST  MRA HEAD WITHOUT CONTRAST  TECHNIQUE: Multiplanar, multiecho pulse sequences of the brain and surrounding structures were obtained without intravenous contrast. Angiographic images of the head were obtained using MRA technique without contrast.  COMPARISON:  Prior CT from 05/05/2013 and MRI from 03/31/2008.  FINDINGS: MRI HEAD FINDINGS  The CSF containing spaces are within normal limits for patient age. No focal parenchymal signal abnormality is identified. 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 No other mass lesion identified. There is no midline shift, or extra-axial fluid collection. Ventricles are normal in size without evidence of hydrocephalus. The absence of the septum pellucidum noted.  There is abnormal restricted diffusion involving the left basal ganglia, most prevalent within the posterior aspect of the putamen. There is some involvement of the periventricular white matter anterior to the atrium of the left lateral ventricle. Scattered foci of restricted diffusion also seen within the insular cortex on the left bowel as well as the left temporoparietal region (series 3, image 19, 20). Additional small focus  of restricted diffusion seen within the left centrum semi ovale of the left frontoparietal region (series 3, image 25). No evidence of hemorrhagic conversion. Tiny foci of hypointense signal intensity seen within the left putamen on GRE sequence likely represent associated petechial hemorrhages.  The cervicomedullary junction is normal. Pituitary gland is within normal limits. 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 Pituitary stalk is midline. The globes and optic nerves demonstrate a normal appearance with normal signal intensity. The  The bone marrow signal intensity is normal. Calvarium is intact. Visualized upper cervical spine is within normal limits.  Scalp soft tissues are unremarkable.  Paranasal sinuses are clear.  No mastoid effusion.  MRA HEAD FINDINGS  The visualized distal cervical, petrous, cavernous, and supra clinoid segments of the internal carotid arteries are widely patent bilaterally with antegrade flow. The left A1 segment is widely patent. The right A1 segment is hypoplastic. Anterior communicating artery is normal. Anterior cerebral arteries are well opacified bilaterally.  There is focal proximal occlusion of the left M1 segment (series 6, image 71). No opacification of the left MCA territory artery branches seen distally.  The right M1 segment is well opacified with widely patent antegrade flow. Distal right MCA territory branches  are within normal limits.  The left vertebral artery is dominant. Vertebral arteries are well opacified with widely patent antegrade flow. Posterior inferior cerebral arteries are within normal limits. Vertebrobasilar junction and basilar artery are normal. Posterior cerebral arteries are well opacified. Superior cerebellar and anterior inferior cerebellar arteries are normal.  No intracranial aneurysm identified.  IMPRESSION: MRI BRAIN:  1. 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 No evidence  of hemorrhagic conversion. 2. 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 MRA BRAIN:  1. Complete occlusion of the proximal left M1 segment. 2. No other proximal branch occlusion or high-grade flow-limiting stenosis identified within the intracranial circulation. 3. No intracranial aneurysm.  Critical Value/emergent results were called by telephone at the time of interpretation on 05/06/2013 at 11:00 PM to Dr. Roland Rack , who verbally acknowledged these results.   Electronically Signed   By: Jeannine Boga M.D.   On: 05/06/2013 23:13    Anti-infectives: Anti-infectives   None      Assessment/Plan: s/p Procedure(s): RADIOLOGY WITH ANESTHESIA (N/A)  CVA L MCA revascularization in IR 4/4 Will follow Continue ASA/Plavix after swallow study Will report to Dr Estanislado Pandy   LOS: 3 days    Griffin Dewilde A 05/08/2013
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
## 2013-05-08 NOTE — Progress Notes (Signed)
OT Cancellation Note  Patient Details Name: Isaac Johnson MRN: [id number] DOB: Sep 27, 1965   Cancelled Treatment:    Reason Eval/Treat Not Completed: Patient not medically ready - pt remains on bedrest.  Will reattempt tomorrow.  Darlina Rumpf [place], OTR/L [phone] 05/08/2013, 9:55 AM

## 2013-05-08 NOTE — Progress Notes (Signed)
PT Cancellation Note  Patient Details Name: JANIS CUFFE MRN: [id number] DOB: 30-Jul-1965   Cancelled Treatment:    Reason Eval/Treat Not Completed: Patient not medically ready. Patient remains on bedrest per orders.  Will return at later time for PT eval as appropriate.  MD:  Please write activity orders when appropriate for patient.  Thank you.   Despina Pole 05/08/2013, 8:06 AM Carita Pian. Sanjuana Kava, [place] Pager [phone] \

## 2013-05-09 ENCOUNTER — Encounter (HOSPITAL_COMMUNITY): Payer: Self-pay | Admitting: Interventional Radiology

## 2013-05-09 DIAGNOSIS — I509 Heart failure, unspecified: Secondary | ICD-10-CM

## 2013-05-09 DIAGNOSIS — I5022 Chronic systolic (congestive) heart failure: Secondary | ICD-10-CM

## 2013-05-09 LAB — BASIC METABOLIC PANEL
BUN: 9 mg/dL (ref 6–23)
CHLORIDE: 105 meq/L (ref 96–112)
CO2: 22 meq/L (ref 19–32)
Calcium: 7.4 mg/dL — ABNORMAL LOW (ref 8.4–10.5)
Creatinine, Ser: 1.05 mg/dL (ref 0.50–1.35)
GFR calc Af Amer: >90 mL/min (ref >90)
GFR calc non Af Amer: 82 mL/min — ABNORMAL LOW (ref >90)
GLUCOSE: 85 mg/dL (ref 70–99)
POTASSIUM: 3.3 meq/L — AB (ref 3.7–5.3)
SODIUM: 141 meq/L (ref 137–147)

## 2013-05-09 MED ORDER — FUROSEMIDE 20 MG PO TABS
20.0000 mg | ORAL_TABLET | Freq: Every day | ORAL | Status: DC
  Administered 2013-05-09 – 2013-05-12 (×4): 20 mg via ORAL
  Filled 2013-05-09 (×4): qty 1

## 2013-05-09 MED ORDER — SPIRONOLACTONE 12.5 MG HALF TABLET
12.5000 mg | ORAL_TABLET | Freq: Every day | ORAL | Status: DC
  Administered 2013-05-09 – 2013-05-12 (×4): 12.5 mg via ORAL
  Filled 2013-05-09 (×4): qty 1

## 2013-05-09 MED ORDER — LOSARTAN POTASSIUM 25 MG PO TABS
25.0000 mg | ORAL_TABLET | Freq: Every day | ORAL | Status: DC
  Administered 2013-05-09 – 2013-05-12 (×4): 25 mg via ORAL
  Filled 2013-05-09 (×4): qty 1

## 2013-05-09 MED ORDER — POTASSIUM CHLORIDE 20 MEQ/15ML (10%) PO LIQD
40.0000 meq | Freq: Once | ORAL | Status: AC
  Administered 2013-05-09: 40 meq via ORAL
  Filled 2013-05-09: qty 30
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
## 2013-05-09 NOTE — Progress Notes (Signed)
Report called and met RN at bedside.  Patient transferred to 4N22 from 7Z83 without complication. His family and belongings traveled with him.

## 2013-05-09 NOTE — Progress Notes (Signed)
Patient arrived via wheelchair to 4N.  3MW Rn and this RN transferred patient to the bed from the wheelchair and he is a 2+ person assist.  Patient oriented to room and 4N policies, given something to drink and family was updated in room.  Will monitor patient. Kizzie Bane, RN

## 2013-05-09 NOTE — Progress Notes (Signed)
OT Cancellation Note  Patient Details Name: Isaac Johnson MRN: [id number] DOB: 11-26-1965   Cancelled Treatment:    Reason Eval/Treat Not Completed: Patient not medically ready - pt remains on bedrest.  Will initiate OT eval once activity levels increased by MD  Darlina Rumpf [place], OTR/L [phone] 05/09/2013, 10:11 AM

## 2013-05-09 NOTE — Evaluation (Signed)
Physical Therapy Evaluation Patient Details Name: Isaac Johnson MRN: [id number] DOB: 26-Apr-1965 Today's Date: 05/09/2013   History of Present Illness  WOODFIN KISS is a 48 y.o. male with a history of hypertension who presented with right sided weakness 05/05/2013. [name] He was taken to [facility] emergency department where he was seen to have severe right-sided weakness. TPA was initiated at [facility] and he was transferred to [facility] for further management. MRI/MRAI: Scattered acute ischemic infarcts involving the left basal ganglia and left MCA territory; Complete occlusion of the proximal left M1 segment. Underwent L MCA revascularization 4/4 early am  Clinical Impression  Patient presents with problems listed below.  Will benefit from acute PT to maximize functional mobility prior to discharge.  Patient was independent/working pta.  Currently has deficits with mobility, ADL's, cognition.  Recommend Inpatient Rehab consult for comprehensive therapies to facilitate return to highest functional level possible.    Follow Up Recommendations CIR    Equipment Recommendations  Wheelchair (measurements PT);Wheelchair cushion (measurements PT)    Recommendations for Other Services Rehab consult     Precautions / Restrictions Precautions Precautions: Fall Precaution Comments: Rt hemiparesis Restrictions Weight Bearing Restrictions: No      Mobility  Bed Mobility Overal bed mobility: Needs Assistance;+ 2 for safety/equipment Bed Mobility: Rolling;Sidelying to Sit Rolling: Min assist;Mod assist (Min A to right; Mod A to left) Sidelying to sit: Mod assist;+2 for safety/equipment       General bed mobility comments: Verbal and tactile cues for technique.  Patient able to roll to Rt side with use of rail with LUE.  Assist to bring hips over.  When rolling to left, verbal cues to use Lt hand to bring RUE across body to roll.  Assisted patient to flex RLE and use to assist with rolling.  Patient having  difficulty engaging Rt trunk muscles to assist with rolling - required mod assist to move to Lt side.  Transfers Overall transfer level: Needs assistance Equipment used: 2 person hand held assist Transfers: Sit to/from [facility] Sit to Stand: Mod assist;+2 physical assistance Stand pivot transfers: Max assist;+2 physical assistance       General transfer comment: Patient with decreased control of RLE in stance - provided assist at Rt knee.  Patient leaning to right with trunk rotated to right.  Cues to stand upright.  Required max assist to attempt to pivot to chair.  Difficulty pivoting with LLE - unable to support weight on RLE.  Ambulation/Gait                Stairs            Wheelchair Mobility    Modified Rankin (Stroke Patients Only) Modified Rankin (Stroke Patients Only) Pre-Morbid Rankin Score: No symptoms Modified Rankin: Severe disability     Balance Overall balance assessment: Needs assistance Sitting-balance support: Single extremity supported;Feet supported Sitting balance-Leahy Scale: Fair Sitting balance - Comments: sitting EOC while reaching with Lt. UE   Standing balance support: Single extremity supported Standing balance-Leahy Scale: Poor                               Pertinent Vitals/Pain     Home Living Family/patient expects to be discharged to:: Inpatient rehab Living Arrangements: Spouse/significant other;Children (66 yo daughter) Available Help at Discharge: Family;Available PRN/intermittently (Wife works second shift) Type of Home: House Home Access: Stairs to enter Entrance Stairs-Rails: None Entrance [employer]  of Steps: 3 Home Layout: One level Home Equipment: None      Prior Function Level of Independence: Independent         Comments: 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       Hand Dominance   Dominant Hand: Right    Extremity/Trunk Assessment   Upper Extremity  Assessment: Defer to OT evaluation RUE Deficits / Details: Pt with trace movement elbow flexion, shoulder extension.  0/5 all other movements   RUE Sensation: decreased light touch     Lower Extremity Assessment: RLE deficits/detail RLE Deficits / Details: Strength 2+/5 at hip and knee; trace PF; no DF    Cervical / Trunk Assessment: Other exceptions  Communication   Communication: Expressive difficulties (dysarthria)  Cognition Arousal/Alertness: Awake/alert Behavior During Therapy: Flat affect Overall Cognitive Status: Impaired/Different from baseline Area of Impairment: Problem solving   Current Attention Level: Selective         Problem Solving: Slow processing;Difficulty sequencing;Requires verbal cues      General Comments General comments (skin integrity, edema, etc.): Pt fatigued with OT states he did not sleep last pm    Exercises        Assessment/Plan    PT Assessment Patient needs continued PT services  PT Diagnosis Difficulty walking;Hemiplegia dominant side;Altered mental status   PT Problem List Decreased strength;Decreased activity tolerance;Decreased balance;Decreased mobility;Decreased cognition;Decreased coordination;Decreased knowledge of use of DME;Decreased safety awareness;Cardiopulmonary status limiting activity;Impaired tone;Impaired sensation  PT Treatment Interventions DME instruction;Gait training;Functional mobility training;Balance training;Neuromuscular re-education;Cognitive remediation;Patient/family education   PT Goals (Current goals can be found in the Care Plan section) Acute Rehab PT Goals Patient Stated Goal: To get better PT Goal Formulation: With patient Time For Goal Achievement: 05/23/13 Potential to Achieve Goals: Good    Frequency Min 4X/week   Barriers to discharge Decreased caregiver support Wife works 2nd shift    Co-evaluation               End of [place] During Treatment: Gait  belt Activity Tolerance: Patient limited by fatigue Patient left: in chair;with call bell/phone within reach Nurse Communication: Mobility status         Time: 1146-1202 PT Time Calculation (min): 16 min   Charges:   PT Evaluation $Initial PT Evaluation Tier I: 1 Procedure PT Treatments $Therapeutic Activity: 8-22 mins   PT G Codes:          Despina Pole 05/09/2013, 2:01 PM Carita Pian. Sanjuana Kava, [place] Pager [phone]

## 2013-05-09 NOTE — Consult Note (Signed)
Physical Medicine and Rehabilitation Consult  Reason for Consult: Right sided weakness Referring Physician: Dr. Pearlean Brownie   HPI: Isaac Johnson is a [name] with history of CHF, stomach ulcers, sciatica LLE,  brain lipoma; who was admitted to [facility] on 05/06/13 with right side weakness. He was treated with tPA and transferred to [facility] for further treatment. CT head negative for acute changes.   2D echo with EF 25% with diffuse hypokinesis and grade 2 diastolic dysfunction. Carotid dopplers without significant ICA stenosis. Patient with improvement after tPA .  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 Corliss Skains.  He was evaluated by Dr. Shirlee Latch who who recommends heart cath prior to discharge to rule out CAD as cause of CM and expressed concerns that CVA could be cardioembolic due to low EF.  Medications adjusted and . He was started on ASA and plavix for secondary stroke prevention given stent in place. He has had high levels of anxiety with SOB (hyperventilating?) requiring ativan. CTA of chest negative for PE but with evidence of nodal prominence of questionable etiology.  Therapy evaluations completed and CIR recommended by rehab team.    Review of Systems  HENT: Negative for hearing loss.   Eyes: Negative for blurred vision and double vision.  Respiratory: Positive for shortness of breath (with activity).   Cardiovascular: Negative for chest pain and palpitations.  Gastrointestinal: Positive for heartburn. Negative for nausea and vomiting.  Genitourinary: Negative for urgency and  frequency.  Musculoskeletal:       LLE pain with occasional instability.  Neurological: Positive for sensory change, speech change, focal weakness and weakness. Negative for headaches.  Psychiatric/Behavioral: Positive for depression. The patient is nervous/anxious.     Past Medical History  Diagnosis Date  . Hypertension   . Stomach ulcer   . Gait instability   . Proteinuria   . GERD (gastroesophageal reflux disease)   . Insomnia   . Dizziness   . Leukopenia   . Sciatica     left  . Brain lipoma 2010    Dr. Dutch Quint  . CHF (congestive heart failure)     Past Surgical History  Procedure Laterality Date  . Radiology with anesthesia N/A 05/07/2013    Procedure: RADIOLOGY WITH ANESTHESIA;  Surgeon: Oneal Grout, MD;  Location: [facility] OR;  Service: Radiology;  Laterality: N/A;    Family History  Problem Relation Age of Onset  . Stroke Mother 82  . Gout Father     Social History:   Married. Wife works. Has a 71 year old daughter at home. Works as a Designer, industrial/product,  He reports that he has never smoked. He does not have any smokeless tobacco history on file. He reports that he does not drink alcohol or use illicit drugs.  Allergies: No Known Allergies  Medications Prior to Admission  Medication Sig Dispense Refill  . carvedilol (COREG) 3.125 MG tablet Take 3.125 mg by mouth 2 (two) times daily with a meal.      . furosemide (LASIX) 20 MG tablet Take 20 mg by mouth daily.      [name]  ibuprofen (ADVIL,MOTRIN) 200 MG tablet Take 200 mg by mouth daily as needed (pain).      [name] losartan (COZAAR) 50 MG tablet Take 50 mg by mouth daily.      [name] omeprazole (PRILOSEC) 20 MG capsule Take 20 mg by mouth daily.        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   Functional Status:  Mobility: Bed Mobility Overal bed mobility: Needs Assistance;+ 2 for safety/equipment Bed Mobility: Rolling;Sidelying to Sit Rolling: Min assist;Mod assist (Min A to right; Mod A to left) Sidelying to sit: Mod assist;+2 for safety/equipment General bed mobility comments: Verbal and tactile cues for technique.  Patient able to roll to Rt side with use of rail with LUE.  Assist to bring hips over.  When rolling to left, verbal cues to use Lt hand to bring RUE across body to roll.  Assisted patient to flex RLE and use to assist with rolling.  Patient having difficulty engaging Rt trunk muscles to assist with rolling - required mod assist to move to Lt side. Transfers Overall transfer level: Needs assistance Equipment used: 2 person hand held assist Transfers: Sit to/from [facility] Sit to Stand: Mod assist;+2 physical assistance Stand pivot transfers: Max assist;+2 physical assistance General transfer comment: Patient with decreased control of RLE in stance - provided assist at Rt knee.  Patient leaning to right with trunk rotated to right.  Cues to stand upright.  Required max assist to attempt to pivot to chair.  Difficulty pivoting with LLE - unable to support weight on RLE.      ADL: ADL Overall ADL's : Needs assistance/impaired Eating/Feeding: Supervision/ safety;Set up;Sitting Grooming: Oral care;Wash/dry face;Wash/dry hands;Brushing hair;Supervision/safety;Sitting Grooming Details (indicate cue type and reason): using Lt. UE (is Rt. hand dominant) Upper Body Bathing: Moderate assistance;Sitting Lower Body Bathing: Moderate assistance;Sit to/from stand Upper Body  Dressing : Moderate assistance;Sitting Lower Body Dressing: Maximal assistance;Sit to/from stand Toilet Transfer: Maximal assistance;Stand-pivot;BSC Toileting- Clothing Manipulation and Hygiene: Total assistance;Sit to/from stand Functional mobility during ADLs: Moderate assistance (sit to stand) General ADL Comments: Pt able to recognize and use grooming objects appropriately.  Requires assist due to Rt. hemiplegia and impaired balance.   Cognition: Cognition Overall Cognitive Status: Impaired/Different from baseline Arousal/Alertness: Awake/alert Orientation Level: Oriented X4 Attention: Alternating Alternating Attention: Impaired Alternating Attention Impairment: Verbal complex;Functional basic;Functional complex Memory: Impaired Memory Impairment: Decreased recall of new information Awareness: Appears intact Problem Solving: Impaired Problem Solving Impairment: Verbal complex;Functional complex Executive Function: Writer: Impaired Organizing Impairment: Verbal complex Safety/Judgment: Appears intact Cognition Arousal/Alertness: Awake/alert Behavior During Therapy: Flat affect Overall Cognitive Status: Impaired/Different from baseline Area of Impairment: Problem solving Current Attention Level: Selective Problem Solving: Slow processing;Difficulty sequencing;Requires verbal cues  Blood pressure 149/100, pulse 100, temperature 97.9 F (36.6 C), temperature source Oral, resp. rate 18, height 6' (1.829 m), weight 77.3 kg (170 lb 6.7 oz), SpO2 100.00%. Physical Exam  Nursing note and vitals reviewed. Constitutional: He is oriented to person, place, and time. He appears well-developed and well-nourished.  HENT:  Head: Normocephalic and atraumatic.  Poor dentition.  Eyes: Conjunctivae are normal. Pupils are equal, round, and reactive to light.  Neck: Normal range of motion. Neck supple.  Cardiovascular: Normal rate and regular rhythm.   No murmur  heard. Respiratory: Effort normal and breath sounds normal. No respiratory distress. He has no wheezes.  GI: Soft. Bowel sounds are normal. He exhibits no distension. There is no tenderness.  Musculoskeletal: He exhibits no edema and no tenderness.  Neurological: He is alert and oriented to person, place, and time.  Poor awareness of posture with tendency to slump to the left. Speech soft but clear. Ocasionally hyperventilating due to anxiety. Able to follow basic commands without difficulty. Right hemiparesis UE>LE. Mild sensory deficits on right side. Pt with right central 7 and tongue deviation. Speech dysarthric. Sensation 1/2 right arm,leg but can sense pain. RUE with trace pecs but otherwise 0/5 RUE. RLE tr 1 at HF,KE and KE/KF, 0/5 at ankle. DTR's 1+. No resting tone.  Skin: Skin is warm and dry.  Psychiatric:  Flat, ?depressed    Results for orders placed during the hospital encounter of 05/05/13 (from the past 24 hour(s))  BASIC METABOLIC PANEL     Status: Abnormal   Collection Time    05/10/13  2:49 AM      Result Value Ref Range   Sodium 139  137 - 147 mEq/L   Potassium 3.2 (*) 3.7 - 5.3 mEq/L   Chloride 105  96 - 112 mEq/L   CO2 17 (*) 19 - 32 mEq/L   Glucose, Bld 167 (*) 70 - 99 mg/dL   BUN 12  6 - 23 mg/dL   Creatinine, Ser 1.09  0.50 - 1.35 mg/dL   Calcium 7.3 (*) 8.4 - 10.5 mg/dL   GFR calc non Af Amer 78 (*) >90 mL/min   GFR calc Af Amer >90  >90 mL/min  MAGNESIUM     Status: None   Collection Time    05/10/13  2:49 AM      Result Value Ref Range   Magnesium 1.8  1.5 - 2.5 mg/dL  BLOOD GAS, ARTERIAL     Status: Abnormal   Collection Time    05/10/13  3:01 AM      Result Value Ref Range   FIO2 0.32     O2 Content 3.0     Delivery systems NO CHARGE     pH, Arterial 7.416  7.350 - 7.450   pCO2 arterial 32.4 (*) 35.0 - 45.0 mmHg   pO2, Arterial 165.0 (*) 80.0 - 100.0 mmHg   Bicarbonate 20.4  20.0 - 24.0 mEq/L   TCO2 21.4  0 - 100 mmol/L   Acid-base deficit 3.4  (*) 0.0 - 2.0 mmol/L   O2 Saturation 99.2     Patient temperature 98.6     Collection site RIGHT RADIAL     Drawn by [id number]     Sample type ARTERIAL DRAW     Allens test (pass/fail) PASS  PASS   Ct Angio Chest Pe W/cm &/or Wo Cm  05/10/2013   CLINICAL DATA:  Decreased O2 saturation.  Tachycardia.  EXAM: CT ANGIOGRAPHY CHEST WITH CONTRAST  TECHNIQUE: Multidetector CT imaging of the chest was performed using the standard protocol during bolus administration of intravenous contrast. Multiplanar CT image reconstructions and MIPs were obtained to evaluate the vascular anatomy.  CONTRAST:  171mL OMNIPAQUE IOHEXOL 350 MG/ML SOLN  COMPARISON:  Chest radiograph performed 05/07/2013  FINDINGS: There is no evidence of pulmonary embolus.  Small bilateral pleural effusions are  noted. Mild interstitial prominence raises concern for mild interstitial edema. Mild left basilar atelectasis is seen. There is no evidence of pneumothorax. No masses are identified; no abnormal focal contrast enhancement is seen.  The heart is enlarged. Prominent bilateral hilar nodes are seen, measuring up to 1.7 cm on the right and 1.2 cm on the left. These are of uncertain significance. Subcarinal and azygoesophageal recess nodes measure up to 1.4 cm in short axis. The mediastinum is otherwise unremarkable in appearance. Trace pericardial fluid remains within normal limits. The great vessels are grossly unremarkable in appearance. No axillary lymphadenopathy is seen. The visualized portions of the thyroid gland are unremarkable in appearance.  The visualized portions of the liver and spleen are unremarkable.  No acute osseous abnormalities are seen.  Review of the MIP images confirms the above findings.  IMPRESSION: 1. No evidence of pulmonary embolus. 2. Small bilateral pleural effusions noted. Mild interstitial prominence raises concern for mild interstitial edema. 3. Mild left basilar atelectasis seen. 4. Cardiomegaly noted. 5. Prominent  mediastinal and bilateral hilar nodes, of uncertain significance. These measure up to 1.4 cm at the subcarinal region and 1.7 cm at the right hilum. Would correlate for evidence of underlying systemic process.   Electronically Signed   By: Garald Balding M.D.   On: 05/10/2013 03:31   Mr Jodene Nam Head Wo Contrast  05/08/2013   CLINICAL DATA:  Cerebral ischemia, status post endovascular treatment. Right-sided weakness.  EXAM: MRI HEAD WITHOUT CONTRAST  MRA HEAD WITHOUT CONTRAST  TECHNIQUE: Multiplanar, multiecho pulse sequences of the brain and surrounding structures were obtained without intravenous contrast. Angiographic images of the head were obtained using MRA technique without contrast.  COMPARISON:  CT HEAD W/O CM dated 05/07/2013; IR ANGIO INTRA EXTRACRAN SEL INTERNAL CAROTID UNI L MOD SED dated 05/06/2013; CT HEAD W/O CM dated 05/05/2013; MR HEAD W/O CM dated 05/06/2013; SP ANGIO/CAR/CERV BI dated 06/08/2008  FINDINGS: MRI HEAD FINDINGS  Slight progression of left MCA territory infarction, now involving more of the basal ganglia, including the caudate, putamen, and inferior globus pallidus as compared to the pretreatment MR 05/06/2013. Also new scattered areas of involvement of the left frontal cortex and subcortical white matter, left posterior temporal cortex, and left insular region. Gradient sequence demonstrates no significant hemorrhagic transformation. No midline shift.  Unchanged collicular plate lipoma. Negative orbits, sinuses, and mastoids.  MRA HEAD FINDINGS  There is gross patency of both internal carotid arteries. The left is dominant. Both anterior cerebrals fill from the left due to an atretic right A1 ACA. Fusiform prominence left ICA terminus appears similar to prior angiographic films. Basilar artery widely patent with both vertebrals contributing. No right MCA stenosis or occlusion. No intracranial aneurysm. Both posterior cerebral arteries are widely patent.  There is no visible flow related  enhancement of the left MCA proximally or distally. The left MCA proximally appears to have re-occluded.  IMPRESSION: Slight progression of intracranial multifocal left MCA territory infarcts, as described above. No hemorrhage or midline shift.  No appreciable flow related enhancement in the left MCA proximally or distally, suspect re-occlusion of the left MCA ; see discussion above.   Electronically Signed   By: Rolla Flatten M.D.   On: 05/08/2013 10:11   Mr Brain Wo Contrast  05/08/2013   CLINICAL DATA:  Cerebral ischemia, status post endovascular treatment. Right-sided weakness.  EXAM: MRI HEAD WITHOUT CONTRAST  MRA HEAD WITHOUT CONTRAST  TECHNIQUE: Multiplanar, multiecho pulse sequences of the brain and surrounding structures were obtained  without intravenous contrast. Angiographic images of the head were obtained using MRA technique without contrast.  COMPARISON:  CT HEAD W/O CM dated 05/07/2013; IR ANGIO INTRA EXTRACRAN SEL INTERNAL CAROTID UNI L MOD SED dated 05/06/2013; CT HEAD W/O CM dated 05/05/2013; MR HEAD W/O CM dated 05/06/2013; SP ANGIO/CAR/CERV BI dated 06/08/2008  FINDINGS: MRI HEAD FINDINGS  Slight progression of left MCA territory infarction, now involving more of the basal ganglia, including the caudate, putamen, and inferior globus pallidus as compared to the pretreatment MR 05/06/2013. Also new scattered areas of involvement of the left frontal cortex and subcortical white matter, left posterior temporal cortex, and left insular region. Gradient sequence demonstrates no significant hemorrhagic transformation. No midline shift.  Unchanged collicular plate lipoma. Negative orbits, sinuses, and mastoids.  MRA HEAD FINDINGS  There is gross patency of both internal carotid arteries. The left is dominant. Both anterior cerebrals fill from the left due to an atretic right A1 ACA. Fusiform prominence left ICA terminus appears similar to prior angiographic films. Basilar artery widely patent with both vertebrals  contributing. No right MCA stenosis or occlusion. No intracranial aneurysm. Both posterior cerebral arteries are widely patent.  There is no visible flow related enhancement of the left MCA proximally or distally. The left MCA proximally appears to have re-occluded.  IMPRESSION: Slight progression of intracranial multifocal left MCA territory infarcts, as described above. No hemorrhage or midline shift.  No appreciable flow related enhancement in the left MCA proximally or distally, suspect re-occlusion of the left MCA ; see discussion above.   Electronically Signed   By: Davonna Belling M.D.   On: 05/08/2013 10:11    Assessment/Plan: Diagnosis: multiple infarcts in the left MCA distribution 1. Does the need for close, 24 hr/day medical supervision in concert with the patient's rehab needs make it unreasonable for this patient to be served in a less intensive setting? Yes 2. Co-Morbidities requiring supervision/potential complications: htn, gerd 3. Due to bladder management, bowel management, safety, skin/wound care, disease management, medication administration, pain management and patient education, does the patient require 24 hr/day rehab nursing? Yes 4. Does the patient require coordinated care of a physician, rehab nurse, PT (1-2 hrs/day, 5 days/week), OT (1-2 hrs/day, 5 days/week) and SLP (1-2 hrs/day, 5 days/week) to address physical and functional deficits in the context of the above medical diagnosis(es)? Yes Addressing deficits in the following areas: balance, endurance, locomotion, strength, transferring, bowel/bladder control, bathing, dressing, feeding, grooming, toileting, cognition, speech, language, swallowing and psychosocial support 5. Can the patient actively participate in an intensive therapy program of at least 3 hrs of therapy per day at least 5 days per week? Yes 6. The potential for patient to make measurable gains while on inpatient rehab is excellent 7. Anticipated functional  outcomes upon discharge from inpatient rehab are supervision and min assist  with PT, supervision and min assist with OT, supervision with SLP. 8. Estimated rehab length of stay to reach the above functional goals is: 20-24 days 9. Does the patient have adequate social supports to accommodate these discharge functional goals? Yes 10. Anticipated D/C setting: Home 11. Anticipated post D/C treatments: HH therapy and Outpatient therapy 12. Overall Rehab/Functional Prognosis: excellent  RECOMMENDATIONS: This patient's condition is appropriate for continued rehabilitative care in the following setting: CIR Patient has agreed to participate in recommended program. Yes Note that insurance prior authorization may be required for reimbursement for recommended care.  Comment: Rehab Admissions Coordinator to follow up.  Thanks,  Ranelle Oyster, MD,  [facility]     05/10/2013

## 2013-05-09 NOTE — Progress Notes (Signed)
SLP Cancellation Note  Patient Details Name: Isaac Johnson MRN: [id number] DOB: 14-May-1965   Cancelled treatment:        Late entry.  Attempted to see pt. For follow-up assessment of diet tolerance and speech/language/cognitive tx.  Pt. Was being transferred to [facility].  Will return 4/7.   Quinn Axe T 05/09/2013, 5:04 PM

## 2013-05-09 NOTE — Evaluation (Signed)
Occupational Therapy Evaluation Patient Details Name: Isaac Johnson MRN: [id number] DOB: Mar 20, 1965 Today's Date: 05/09/2013    History of Present Illness Isaac Johnson is a 48 y.o. male with a history of hypertension who presented with right sided weakness 05/05/2013. [name] He was taken to [facility] emergency department where he was seen to have severe right-sided weakness. TPA was initiated at [facility] and he was transferred to [facility] for further management. MRI/MRAI: Scattered acute ischemic infarcts involving the left basal ganglia and left MCA territory; Complete occlusion of the proximal left M1 segment. Underwent L MCA revascularization 4/4 early am   Clinical Impression   Pt admitted with above. He demonstrates the below listed deficits and will benefit from continued OT to maximize safety and independence with BADLs.  Pt presents to OT with Rt hemiplegia, flat affect, slow processing - but pt fatigued during OT eval.  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  He needs extensive rehab, Recommend CIR to allow him to maximize independence with BADLs.     Follow Up Recommendations  CIR;Supervision/Assistance - 24 hour    Equipment Recommendations  3 in 1 bedside comode;Tub/shower bench    Recommendations for Other Services Rehab consult     Precautions / Restrictions Precautions Precautions: Fall Precaution Comments: Rt hemiparesis Restrictions Weight Bearing Restrictions: No      Mobility Bed Mobility Overal bed mobility: Needs Assistance;+ 2 for safety/equipment Bed Mobility: Rolling;Sidelying to Sit Rolling: Min assist;Mod assist (Min A to right; Mod A to left) Sidelying to sit: Mod assist;+2 for safety/equipment          Transfers Overall transfer level: Needs assistance   Transfers: Sit to/from Stand Sit to Stand: Mod assist         General transfer comment: Required  faciliation at Rt knee and hip for extension - pt pitches to Rt.      Balance Overall balance assessment: Needs assistance Sitting-balance support: Feet supported;No upper extremity supported Sitting balance-Leahy Scale: Good Sitting balance - Comments: sitting EOC while reaching with Lt. UE   Standing balance support: Single extremity supported Standing balance-Leahy Scale: Poor                              ADL Overall ADL's : Needs assistance/impaired Eating/Feeding: Supervision/ safety;Set up;Sitting   Grooming: Oral care;Wash/dry face;Wash/dry hands;Brushing hair;Supervision/safety;Sitting Grooming Details (indicate cue type and reason): using Lt. UE (is Rt. hand dominant) Upper Body Bathing: Moderate assistance;Sitting   Lower Body Bathing: Moderate assistance;Sit to/from stand   Upper Body Dressing : Moderate assistance;Sitting   Lower Body Dressing: Maximal assistance;Sit to/from stand   Toilet Transfer: Maximal assistance;Stand-pivot;BSC   Toileting- Clothing Manipulation and Hygiene: Total assistance;Sit to/from stand       Functional mobility during ADLs: Moderate assistance (sit to stand) General ADL Comments: Pt able to recognize and use grooming objects appropriately.  Requires assist due to Rt. hemiplegia and impaired balance.      Vision Eye Alignment: Within Functional Limits Alignment/Gaze Preference: Within Defined Limits Ocular Range of Motion: Within Functional Limits Tracking/Visual Pursuits: Decreased smoothness of vertical tracking (on Rt.)         Additional Comments: pt. consistently moved eyes off target as it moved into lower Rt quadrant and had to initiate a saccade to relocate it.    Perception Perception Perception Tested?: Yes  Praxis Praxis Praxis tested?: Within functional limits    Pertinent Vitals/Pain Pt denies pain.  BP 149/101     Hand Dominance Right   Extremity/Trunk Assessment Upper Extremity  Assessment Upper Extremity Assessment: Defer to OT evaluation RUE Deficits / Details: Pt with trace movement elbow flexion, shoulder extension.  0/5 all other movements RUE Sensation: decreased light touch RUE Coordination: decreased fine motor;decreased gross motor   Lower Extremity Assessment Lower Extremity Assessment: RLE deficits/detail RLE Deficits / Details: Strength 2+/5 at hip and knee; trace PF; no DF RLE Sensation: decreased light touch RLE Coordination: decreased gross motor   Cervical / Trunk Assessment Cervical / Trunk Assessment: Other exceptions Cervical / Trunk Exceptions: Decreased trunk strength Rt side    Communication Communication Communication: Expressive difficulties (dysarthria)   Cognition Arousal/Alertness: Awake/alert Behavior During Therapy: Flat affect Overall Cognitive Status: Impaired/Different from baseline Area of Impairment: Problem solving   Current Attention Level: Selective         Problem Solving: Slow processing;Difficulty sequencing;Requires verbal cues     General Comments       Exercises       Shoulder Instructions      Home Living Family/patient expects to be discharged to:: Inpatient rehab Living Arrangements: Spouse/significant other;Children (90 yo daughter) Available Help at Discharge: Family;Available PRN/intermittently (Wife works second shift) Type of Home: House Home Access: Stairs to enter Technical brewer of Steps: 3 Entrance Stairs-Rails: None Home Layout: One level     Bathroom Shower/Tub: Corporate investment banker: [place]: None      Lives With: Spouse;Daughter    Prior Functioning/Environment Level of Independence: Independent        Comments: 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      OT Diagnosis: Generalized weakness;Cognitive deficits;Disturbance of vision;Hemiplegia dominant side   OT Problem List: Decreased strength;Decreased range  of motion;Decreased activity tolerance;Impaired balance (sitting and/or standing);Impaired vision/perception;Decreased coordination;Decreased cognition;Decreased safety awareness;Decreased knowledge of use of DME or AE;Impaired UE functional use   OT Treatment/Interventions: Self-care/ADL training;Neuromuscular education;DME and/or AE instruction;Splinting;Therapeutic activities;Cognitive remediation/compensation;Visual/perceptual remediation/compensation;Patient/family education;Balance training    OT Goals(Current goals can be found in the care plan section) Acute Rehab OT Goals Patient Stated Goal: To get better OT Goal Formulation: With patient Time For Goal Achievement: 05/23/13 Potential to Achieve Goals: Good ADL Goals Pt Will Perform Upper Body Bathing: with min assist;sitting Pt Will Perform Lower Body Bathing: with min assist;sit to/from stand Pt Will Perform Upper Body Dressing: with min assist;sitting Pt Will Perform Lower Body Dressing: with min assist;sit to/from stand Pt Will Transfer to Toilet: with min assist;stand pivot transfer;bedside commode Pt Will Perform Toileting - Clothing Manipulation and hygiene: with min assist;sit to/from stand Pt/caregiver will Perform Home Exercise Program: Right Upper extremity;Independently (self ROM/PROM) Additional ADL Goal #1: Pt will use Rt. UE as a stabilizer/support Rt. UE 50% of time during ADL activity  OT Frequency: Min 3X/week   Barriers to D/C:    unsure how much assist family can provide at discharge.        Co-evaluation              End of Session Nurse Communication: Mobility status  Activity Tolerance: Patient limited by fatigue Patient left: in chair;with call bell/phone within reach   Time: 1242-1301 OT Time Calculation (min): 19 min Charges:  OT General Charges $OT Visit: 1 Procedure OT Evaluation $Initial OT Evaluation Tier I: 1 Procedure OT Treatments $Therapeutic Activity: 8-22  mins G-Codes:     Lucille Passy M 19-May-2013, 1:57 PM

## 2013-05-09 NOTE — Progress Notes (Signed)
2 Days Post-Op  Subjective: CVA L MCA revasc 4/4 Up in chair Just finished with PT  Objective: Vital signs in last 24 hours: Temp:  [98.3 F (36.8 C)-99.7 F (37.6 C)] 98.3 F (36.8 C) (04/06 0800) Pulse Rate:  [29-117] 78 (04/06 1100) Resp:  [3-31] 15 (04/06 1100) BP: (112-167)/(53-125) 112/53 mmHg (04/06 1100) SpO2:  [93 %-100 %] 96 % (04/06 1100)    Intake/Output from previous day: 04/05 0701 - 04/06 0700 In: 2260 [P.O.:340; I.V.:1920] Out: 1375 [Urine:1375] Intake/Output this shift: Total I/O In: 320 [I.V.:320] Out: 350 [Urine:350]  PE:  Afeb; vss Rt facial droop still apparent Rt arm/hand unable to move on own Rt leg he can bend knee; unable to push/pull Lt extrs working well with good strength and movement Pleasant   Lab Results:   Recent Labs  05/07/13 0635  WBC 4.6  HGB 13.5  HCT 40.6  PLT 169   BMET  Recent Labs  05/07/13 0635 05/09/13 0305  NA 144 141  K 4.6 3.3*  CL 108 105  CO2 16* 22  GLUCOSE 77 85  BUN 14 9  CREATININE 1.23 1.05  CALCIUM 7.7* 7.4*   PT/INR No results found for this basename: LABPROT, INR,  in the last 72 hours ABG  Recent Labs  05/07/13 0602  PHART 7.441  HCO3 20.3    Studies/Results: Mr [facility] Contrast  05/08/2013   CLINICAL DATA:  Cerebral ischemia, status post endovascular treatment. Right-sided weakness.  EXAM: MRI HEAD WITHOUT CONTRAST  MRA HEAD WITHOUT CONTRAST  TECHNIQUE: Multiplanar, multiecho pulse sequences of the brain and surrounding structures were obtained without intravenous contrast. Angiographic images of the head were obtained using MRA technique without contrast.  COMPARISON:  CT HEAD W/O CM dated 05/07/2013; IR ANGIO INTRA EXTRACRAN SEL INTERNAL CAROTID UNI L MOD SED dated 05/06/2013; CT HEAD W/O CM dated 05/05/2013; MR HEAD W/O CM dated 05/06/2013; SP ANGIO/CAR/CERV BI dated 06/08/2008  FINDINGS: MRI HEAD FINDINGS  Slight progression of left MCA territory infarction, now involving more of the basal  ganglia, including the caudate, putamen, and inferior globus pallidus as compared to the pretreatment MR 05/06/2013. Also new scattered areas of involvement of the left frontal cortex and subcortical white matter, left posterior temporal cortex, and left insular region. Gradient sequence demonstrates no significant hemorrhagic transformation. No midline shift.  Unchanged collicular plate lipoma. Negative orbits, sinuses, and mastoids.  MRA HEAD FINDINGS  There is gross patency of both internal carotid arteries. The left is dominant. Both anterior cerebrals fill from the left due to an atretic right A1 ACA. Fusiform prominence left ICA terminus appears similar to prior angiographic films. Basilar artery widely patent with both vertebrals contributing. No right MCA stenosis or occlusion. No intracranial aneurysm. Both posterior cerebral arteries are widely patent.  There is no visible flow related enhancement of the left MCA proximally or distally. The left MCA proximally appears to have re-occluded.  IMPRESSION: Slight progression of intracranial multifocal left MCA territory infarcts, as described above. No hemorrhage or midline shift.  No appreciable flow related enhancement in the left MCA proximally or distally, suspect re-occlusion of the left MCA ; see discussion above.   Electronically Signed   By: Rolla Flatten M.D.   On: 05/08/2013 10:11   Mr Brain Wo Contrast  05/08/2013   CLINICAL DATA:  Cerebral ischemia, status post endovascular treatment. Right-sided weakness.  EXAM: MRI HEAD WITHOUT CONTRAST  MRA HEAD WITHOUT CONTRAST  TECHNIQUE: Multiplanar, multiecho pulse sequences of the  brain and surrounding structures were obtained without intravenous contrast. Angiographic images of the head were obtained using MRA technique without contrast.  COMPARISON:  CT HEAD W/O CM dated 05/07/2013; IR ANGIO INTRA EXTRACRAN SEL INTERNAL CAROTID UNI L MOD SED dated 05/06/2013; CT HEAD W/O CM dated 05/05/2013; MR HEAD W/O CM  dated 05/06/2013; SP ANGIO/CAR/CERV BI dated 06/08/2008  FINDINGS: MRI HEAD FINDINGS  Slight progression of left MCA territory infarction, now involving more of the basal ganglia, including the caudate, putamen, and inferior globus pallidus as compared to the pretreatment MR 05/06/2013. Also new scattered areas of involvement of the left frontal cortex and subcortical white matter, left posterior temporal cortex, and left insular region. Gradient sequence demonstrates no significant hemorrhagic transformation. No midline shift.  Unchanged collicular plate lipoma. Negative orbits, sinuses, and mastoids.  MRA HEAD FINDINGS  There is gross patency of both internal carotid arteries. The left is dominant. Both anterior cerebrals fill from the left due to an atretic right A1 ACA. Fusiform prominence left ICA terminus appears similar to prior angiographic films. Basilar artery widely patent with both vertebrals contributing. No right MCA stenosis or occlusion. No intracranial aneurysm. Both posterior cerebral arteries are widely patent.  There is no visible flow related enhancement of the left MCA proximally or distally. The left MCA proximally appears to have re-occluded.  IMPRESSION: Slight progression of intracranial multifocal left MCA territory infarcts, as described above. No hemorrhage or midline shift.  No appreciable flow related enhancement in the left MCA proximally or distally, suspect re-occlusion of the left MCA ; see discussion above.   Electronically Signed   By: Rolla Flatten M.D.   On: 05/08/2013 10:11    Anti-infectives: Anti-infectives   None      Assessment/Plan: s/p Procedure(s): RADIOLOGY WITH ANESTHESIA (N/A)  CVA; L MCA revasc 4/4 Better daily For Rehab soon   LOS: 4 days    Madi Bonfiglio A 05/09/2013

## 2013-05-09 NOTE — Progress Notes (Signed)
Rehab Admissions Coordinator Note:  Patient was screened by Cleatrice Burke for appropriateness for an Inpatient Acute Rehab Consult per PT and OT recommendations.  At this time, we are recommending Inpatient Rehab consult. I will contact MD to obtain order.   Cleatrice Burke 05/09/2013, 2:25 PM  I can be reached at [phone].
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
## 2013-05-10 ENCOUNTER — Encounter (HOSPITAL_COMMUNITY): Payer: Self-pay | Admitting: Radiology

## 2013-05-10 ENCOUNTER — Inpatient Hospital Stay (HOSPITAL_COMMUNITY): Payer: BC Managed Care – PPO

## 2013-05-10 DIAGNOSIS — I6340 Cerebral infarction due to embolism of unspecified cerebral artery: Secondary | ICD-10-CM

## 2013-05-10 DIAGNOSIS — I10 Essential (primary) hypertension: Secondary | ICD-10-CM

## 2013-05-10 LAB — BLOOD GAS, ARTERIAL
Acid-base deficit: 3.4 mmol/L — ABNORMAL HIGH (ref 0.0–2.0)
BICARBONATE: 20.4 meq/L (ref 20.0–24.0)
Drawn by: 35135
FIO2: 0.32 %
O2 Content: 3 L/min
O2 Saturation: 99.2 %
PATIENT TEMPERATURE: 98.6
PCO2 ART: 32.4 mmHg — AB (ref 35.0–45.0)
PH ART: 7.416 (ref 7.350–7.450)
PO2 ART: 165 mmHg — AB (ref 80.0–100.0)
TCO2: 21.4 mmol/L (ref 0–100)

## 2013-05-10 LAB — BASIC METABOLIC PANEL
BUN: 12 mg/dL (ref 6–23)
BUN: 13 mg/dL (ref 6–23)
CHLORIDE: 108 meq/L (ref 96–112)
CO2: 17 meq/L — AB (ref 19–32)
CO2: 19 mEq/L (ref 19–32)
CREATININE: 1.09 mg/dL (ref 0.50–1.35)
Calcium: 7.3 mg/dL — ABNORMAL LOW (ref 8.4–10.5)
Calcium: 8.3 mg/dL — ABNORMAL LOW (ref 8.4–10.5)
Chloride: 105 mEq/L (ref 96–112)
Creatinine, Ser: 1.25 mg/dL (ref 0.50–1.35)
GFR calc Af Amer: >90 mL/min (ref >90)
GFR, EST AFRICAN AMERICAN: 77 mL/min — AB (ref >90)
GFR, EST NON AFRICAN AMERICAN: 78 mL/min — AB (ref >90)
GFR, EST NON AFRICAN AMERICAN: 66 mL/min — AB (ref >90)
GLUCOSE: 167 mg/dL — AB (ref 70–99)
GLUCOSE: 126 mg/dL — AB (ref 70–99)
Potassium: 3.2 mEq/L — ABNORMAL LOW (ref 3.7–5.3)
Potassium: 3.9 mEq/L (ref 3.7–5.3)
SODIUM: 143 meq/L (ref 137–147)
Sodium: 139 mEq/L (ref 137–147)

## 2013-05-10 LAB — CALCIUM, IONIZED: Calcium, Ion: 1.17 mmol/L (ref 1.12–1.23)

## 2013-05-10 LAB — ANA: Anti Nuclear Antibody(ANA): NEGATIVE

## 2013-05-10 LAB — MAGNESIUM: Magnesium: 1.8 mg/dL (ref 1.5–2.5)

## 2013-05-10 LAB — HIV ANTIBODY (ROUTINE TESTING W REFLEX): HIV: NONREACTIVE

## 2013-05-10 MED ORDER — ZOLPIDEM TARTRATE 5 MG PO TABS: 5.0000 mg | ORAL_TABLET | Freq: Once | ORAL | Status: DC

## 2013-05-10 MED ORDER — SODIUM CHLORIDE 0.9 % IV SOLN
1.0000 g | Freq: Once | INTRAVENOUS | Status: AC
  Administered 2013-05-10: 1 g via INTRAVENOUS
  Filled 2013-05-10 (×2): qty 10

## 2013-05-10 MED ORDER — ZOLPIDEM TARTRATE 5 MG PO TABS
5.0000 mg | ORAL_TABLET | Freq: Every evening | ORAL | Status: DC | PRN
  Administered 2013-05-11: 5 mg via ORAL
  Filled 2013-05-10: qty 1

## 2013-05-10 MED ORDER — BISACODYL 10 MG RE SUPP
10.0000 mg | Freq: Every day | RECTAL | Status: DC | PRN
  Administered 2013-05-10: 10 mg via RECTAL
  Filled 2013-05-10: qty 1

## 2013-05-10 MED ORDER — LORAZEPAM 0.5 MG PO TABS
0.5000 mg | ORAL_TABLET | Freq: Once | ORAL | Status: AC
  Administered 2013-05-10: 0.5 mg via ORAL
  Filled 2013-05-10: qty 1

## 2013-05-10 MED ORDER — POLYETHYLENE GLYCOL 3350 17 G PO PACK
17.0000 g | PACK | Freq: Every day | ORAL | Status: DC
  Filled 2013-05-10 (×2): qty 1

## 2013-05-10 MED ORDER — IOHEXOL 350 MG/ML SOLN
100.0000 mL | Freq: Once | INTRAVENOUS | Status: AC | PRN
Start: 2013-05-10 — End: 2013-05-10
  Administered 2013-05-10: 100 mL via INTRAVENOUS

## 2013-05-10 NOTE — Progress Notes (Signed)
Occupational Therapy Treatment Patient Details Name: Isaac Johnson MRN: [id number] DOB: 1965/04/02 Today's Date: 05/10/2013    History of present illness Isaac Johnson is a 48 y.o. male with a history of hypertension who presented with right sided weakness 05/05/2013. [name] He was taken to [facility] emergency department where he was seen to have severe right-sided weakness. TPA was initiated at [facility] and he was transferred to [facility] for further management. MRI/MRAI: Scattered acute ischemic infarcts involving the left basal ganglia and left MCA territory; Complete occlusion of the proximal left M1 segment. Underwent L MCA revascularization 4/4 early am   OT comments  Pt performed ADLs during session. Progressing towards goals.   Follow Up Recommendations  CIR;Supervision/Assistance - 24 hour    Equipment Recommendations  3 in 1 bedside comode;Tub/shower bench    Recommendations for Other Services Rehab consult    Precautions / Restrictions Precautions Precautions: Fall Precaution Comments: Rt hemiparesis Restrictions Weight Bearing Restrictions: No       Mobility Bed Mobility Overal bed mobility: Needs Assistance Bed Mobility: Supine to Sit     Supine to sit: Mod assist     General bed mobility comments: Assistance with trunk as pt leaning back upon sitting and helped with LE.   Transfers Overall transfer level: Needs assistance   Transfers: Sit to/from [employer] Transfers Sit to Stand: Mod assist;+2 safety/equipment   Squat pivot transfers: Max assist;+2 physical assistance     General transfer comment: Cues for technique. Decreased control of RLE when standing.    Balance                                   ADL Overall ADL's : Needs assistance/impaired     Grooming: Wash/dry face;Set up;Supervision/safety;Sitting (applied lotion)           Upper Body Dressing : Set up;Supervision/safety;Sitting  (gown)    Lower Body Dressing: Minimal  assistance;Sitting/lateral leans (socks)   Toilet Transfer: Squat-pivot;Maximal assistance;+2 for physical assistance;Moderate assistance (sit to stand);+2 for safety/equipment (from bed to chair)            Functional mobility during ADLs: Moderate assistance;Maximal assistance;+2 for physical assistance;+2 for safety/equipment (+2 Total/Max A-squat pivot; +2 Total A/Mod A-sit to stand) General ADL Comments: Educated on dressing technique and pt practiced UB/LB dressing. OT assisted in holding Rt leg over knee. Pt also reached for lotion bottle while sitting and leaned on RUE for weightbearing.  Educated wife on PROM of right digits and also educated on supporting RUE with pillow.        Vision                     Perception     Praxis      Cognition   Behavior During Therapy: Flat affect Overall Cognitive Status: Impaired/Different from baseline Area of Impairment: Problem solving              Problem Solving: Slow processing      Extremity/Trunk Assessment               Exercises     Shoulder Instructions       General Comments      Pertinent Vitals/ Pain       No apparent distress.   Home Living  Prior Functioning/Environment              Frequency Min 3X/week     Progress Toward Goals  OT Goals(current goals can now be found in the care plan section)  Progress towards OT goals: Progressing toward goals  Acute Rehab OT Goals Patient Stated Goal: not stated OT Goal Formulation: With patient Time For Goal Achievement: 05/23/13 Potential to Achieve Goals: Good ADL Goals Pt Will Perform Upper Body Bathing: with min assist;sitting Pt Will Perform Lower Body Bathing: with min assist;sit to/from stand Pt Will Perform Upper Body Dressing: with min assist;sitting Pt Will Perform Lower Body Dressing: with min assist;sit to/from stand Pt Will Transfer to Toilet: with min  assist;stand pivot transfer;bedside commode Pt Will Perform Toileting - Clothing Manipulation and hygiene: with min assist;sit to/from stand Pt/caregiver will Perform Home Exercise Program: Right Upper extremity;Independently (self ROM/PROM) Additional ADL Goal #1: Pt will use Rt. UE as a stabilizer/support Rt. UE 50% of time during ADL activity  Plan Discharge plan remains appropriate    Co-evaluation                 End of Session Equipment Utilized During Treatment: Gait belt;Oxygen   Activity Tolerance Patient limited by fatigue   Patient Left in chair;with call bell/phone within reach;with chair alarm set   Nurse Communication Mobility status        Time: 1501-1520 OT Time Calculation (min): 19 min  Charges: OT General Charges $OT Visit: 1 Procedure OT Treatments $Self Care/Home Management : 8-22 mins  Benito Mccreedy OTR/L [phone] 05/10/2013, 4:48 PM

## 2013-05-10 NOTE — Progress Notes (Signed)
Patient Name: Isaac Johnson Date of Encounter: 05/10/2013     Active Problems:   Stroke    SUBJECTIVE  He does not have any chest pain but does have mild SOB. He reports that the SOB is from the anxiety attacks that he has been having. No orthopnea or PND.   CURRENT MEDS . aspirin  325 mg Oral Q breakfast  . carvedilol  3.125 mg Oral BID WC  . clopidogrel  75 mg Oral Q breakfast  . feeding supplement (ENSURE COMPLETE)  237 mL Oral BID BM  . furosemide  20 mg Oral Daily  . losartan  25 mg Oral Daily  . pantoprazole  80 mg Oral QHS  . spironolactone  12.5 mg Oral Daily    OBJECTIVE  Filed Vitals:   05/10/13 0239 05/10/13 0300 05/10/13 0538 05/10/13 0934  BP: 107/78 129/96 149/100 142/99  Pulse: 82 88 100 96  Temp:  98.8 F (37.1 C) 97.9 F (36.6 C) 98.1 F (36.7 C)  TempSrc:  Oral Oral Oral  Resp: 22 20 18 20   Height:      Weight:      SpO2: 100% 100% 100% 100%    Intake/Output Summary (Last 24 hours) at 05/10/13 1027 Last data filed at 05/10/13 0200  Gross per 24 hour  Intake    560 ml  Output    825 ml  Net   -265 ml   Filed Weights   05/06/13 0004 05/09/13 1433  Weight: 175 lb (79.379 kg) 170 lb 6.7 oz (77.3 kg)    PHYSICAL EXAM  General: Pleasant, NAD. Frail appearing Neuro: Alert and oriented X 3. Moves all extremities spontaneously. Psych: Normal affect. blunted HEENT:  Normal  Neck: Supple without bruits or JVD. Lungs:  Resp regular and unlabored, CTA. Heart: RRR no s3, s4, or murmurs. Gallop present Abdomen: Soft, non-tender, non-distended, BS + x 4.  Extremities: No clubbing, cyanosis or edema. DP/PT/Radials 2+ and equal bilaterally. Abnormal muscle tone  Accessory Clinical Findings  Basic Metabolic Panel  Recent Labs  05/09/13 0305 05/10/13 0249 05/10/13 0710  NA 141 139 143  K 3.3* 3.2* 3.9  CL 105 105 108  CO2 22 17* 19  GLUCOSE 85 167* 126*  BUN 9 12 13   CREATININE 1.05 1.09 1.25  CALCIUM 7.4* 7.3* 8.3*  MG  --  1.8  --      TELE  Frequent PVCs and ventricular trigeminy  Radiology/Studies  Dg Chest 2 View  05/07/2013   CLINICAL DATA:  Recent stroke  EXAM: CHEST  2 VIEW  COMPARISON:  01/20/2013  FINDINGS: Cardiac shadow is enlarged. Vascular congestion and mild pulmonary edema is noted bilaterally. No focal confluent infiltrate is seen. No sizable effusion is noted.  IMPRESSION: Vascular congestion with mild pulmonary edema.   Electronically Signed   By: Inez Catalina M.D.   On: 05/07/2013 07:10   Ct Head Wo Contrast  05/07/2013   CLINICAL DATA:  Post endovascular stent placement.  EXAM: CT HEAD WITHOUT CONTRAST  TECHNIQUE: Contiguous axial images were obtained from the base of the skull through the vertex without intravenous contrast.  COMPARISON:  Prior catheter directed angiogram performed earlier on the same day and MRI head from 05/06/2013  FINDINGS: Metallic stent is now seen within the left M1 segment. The stent extends from the supra clinoid aspect of the left internal carotid artery to the level of the MCA bifurcation.  Previously identified ischemic infarcts involving the left basal ganglia and left  MCA territory are grossly stable. No new acute large vessel territory infarct. There is no intracranial hemorrhage. No mass lesion or midline shift.  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  No extra-axial fluid collection. No hydrocephalus. Calvarium is intact. Orbital soft tissues are normal. Paranasal sinuses and mastoid air cells are clear.  IMPRESSION: 1. Interval placement of endovascular stent within the left M1 segment. 2. Stable size and distribution of scattered ischemic infarcts involving the left basal ganglia and left MCA territory, not significantly changed relative to prior MRI from 05/06/2012. 3. No acute intracranial hemorrhage or new large vessel territory infarct identified.    Electronically Signed   By: Jeannine Boga M.D.   On: 05/07/2013 06:18   Ct Head Wo Contrast  05/06/2013   CLINICAL DATA:  Code stroke, right arm weakness.  EXAM: CT HEAD WITHOUT CONTRAST  TECHNIQUE: Contiguous axial images were obtained from the base of the skull through the vertex without intravenous contrast.  COMPARISON:  SP ANGIO/CAR/CERV BI dated 06/08/2008; MR MRA NECK WO/W CM dated 03/31/2008; MR HEAD WO/W CM dated 03/31/2008  FINDINGS: No acute large vascular territory infarct. No hemorrhage. No midline shift or mass effect.  Colpocephaly, with partial agenesis of the corpus callosum. No hydrocephalus, apparent absent ventricular septum.  Fatty mass within the supra cerebellar cistern again seen consistent with lipoma. Cerebellar tonsils are at but not below the foramen magnum. No abnormal extra-axial fluid collections.  Paranasal sinuses and mastoid air cells appear well-aerated. No skull fracture. Ocular globes and orbital contents are unremarkable.  IMPRESSION: No acute large vascular territory infarct or hemorrhage. Please note, for evaluation of acute ischemia, MRI with diffusion-weighted sequences would be more sensitive.  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  Findings discussed with and reconfirmed by Dr.DOUGLAS DELO on 05/06/2013 12:00 AM.   Electronically Signed   By: Elon Alas   On: 05/06/2013 00:09   Ct Angio Chest Pe W/cm &/or Wo Cm  05/10/2013   CLINICAL DATA:  Decreased O2 saturation.  Tachycardia.  EXAM: CT ANGIOGRAPHY CHEST WITH CONTRAST  TECHNIQUE: Multidetector CT imaging of the chest was performed using the standard protocol during bolus administration of intravenous contrast. Multiplanar CT image reconstructions and MIPs were obtained to evaluate the vascular anatomy.  CONTRAST:  19mL OMNIPAQUE IOHEXOL 350 MG/ML SOLN  COMPARISON:  Chest radiograph performed 05/07/2013  FINDINGS: There is no evidence of pulmonary embolus.  Small bilateral  pleural effusions are noted. Mild interstitial prominence raises concern for mild interstitial edema. Mild left basilar atelectasis is seen. There is no evidence of pneumothorax. No masses are identified; no abnormal focal contrast enhancement is seen.  The heart is enlarged. Prominent bilateral hilar nodes are seen, measuring up to 1.7 cm on the right and 1.2 cm on the left. These are of uncertain significance. Subcarinal and azygoesophageal recess nodes measure up to 1.4 cm in short axis. The mediastinum is otherwise unremarkable in appearance. Trace pericardial fluid remains within normal limits. The great vessels are grossly unremarkable in appearance. No axillary lymphadenopathy is seen. The visualized portions of the thyroid gland are unremarkable in appearance.  The visualized portions of the liver and spleen are unremarkable.  No acute osseous abnormalities are seen.  Review of the MIP images confirms the above findings.  IMPRESSION: 1. No evidence of pulmonary embolus. 2. Small bilateral pleural effusions noted. Mild interstitial prominence  raises concern for mild interstitial edema. 3. Mild left basilar atelectasis seen. 4. Cardiomegaly noted. 5. Prominent mediastinal and bilateral hilar nodes, of uncertain significance. These measure up to 1.4 cm at the subcarinal region and 1.7 cm at the right hilum. Would correlate for evidence of underlying systemic process.   Electronically Signed   By: Garald Balding M.D.   On: 05/10/2013 03:31   Mr Jodene Nam Head Wo Contrast  05/08/2013   CLINICAL DATA:  Cerebral ischemia, status post endovascular treatment. Right-sided weakness.  EXAM: MRI HEAD WITHOUT CONTRAST  MRA HEAD WITHOUT CONTRAST  TECHNIQUE: Multiplanar, multiecho pulse sequences of the brain and surrounding structures were obtained without intravenous contrast. Angiographic images of the head were obtained using MRA technique without contrast.  COMPARISON:  CT HEAD W/O CM dated 05/07/2013; IR ANGIO INTRA  EXTRACRAN SEL INTERNAL CAROTID UNI L MOD SED dated 05/06/2013; CT HEAD W/O CM dated 05/05/2013; MR HEAD W/O CM dated 05/06/2013; SP ANGIO/CAR/CERV BI dated 06/08/2008  FINDINGS: MRI HEAD FINDINGS  Slight progression of left MCA territory infarction, now involving more of the basal ganglia, including the caudate, putamen, and inferior globus pallidus as compared to the pretreatment MR 05/06/2013. Also new scattered areas of involvement of the left frontal cortex and subcortical white matter, left posterior temporal cortex, and left insular region. Gradient sequence demonstrates no significant hemorrhagic transformation. No midline shift.  Unchanged collicular plate lipoma. Negative orbits, sinuses, and mastoids.  MRA HEAD FINDINGS  There is gross patency of both internal carotid arteries. The left is dominant. Both anterior cerebrals fill from the left due to an atretic right A1 ACA. Fusiform prominence left ICA terminus appears similar to prior angiographic films. Basilar artery widely patent with both vertebrals contributing. No right MCA stenosis or occlusion. No intracranial aneurysm. Both posterior cerebral arteries are widely patent.  There is no visible flow related enhancement of the left MCA proximally or distally. The left MCA proximally appears to have re-occluded.  IMPRESSION: Slight progression of intracranial multifocal left MCA territory infarcts, as described above. No hemorrhage or midline shift.  No appreciable flow related enhancement in the left MCA proximally or distally, suspect re-occlusion of the left MCA ; see discussion above.   Electronically Signed   By: Rolla Flatten M.D.   On: 05/08/2013 10:11   Mr Brain Wo Contrast  05/08/2013   CLINICAL DATA:  Cerebral ischemia, status post endovascular treatment. Right-sided weakness.  EXAM: MRI HEAD WITHOUT CONTRAST  MRA HEAD WITHOUT CONTRAST  TECHNIQUE: Multiplanar, multiecho pulse sequences of the brain and surrounding structures were obtained without  intravenous contrast. Angiographic images of the head were obtained using MRA technique without contrast.  COMPARISON:  CT HEAD W/O CM dated 05/07/2013; IR ANGIO INTRA EXTRACRAN SEL INTERNAL CAROTID UNI L MOD SED dated 05/06/2013; CT HEAD W/O CM dated 05/05/2013; MR HEAD W/O CM dated 05/06/2013; SP ANGIO/CAR/CERV BI dated 06/08/2008  FINDINGS: MRI HEAD FINDINGS  Slight progression of left MCA territory infarction, now involving more of the basal ganglia, including the caudate, putamen, and inferior globus pallidus as compared to the pretreatment MR 05/06/2013. Also new scattered areas of involvement of the left frontal cortex and subcortical white matter, left posterior temporal cortex, and left insular region. Gradient sequence demonstrates no significant hemorrhagic transformation. No midline shift.  Unchanged collicular plate lipoma. Negative orbits, sinuses, and mastoids.  MRA HEAD FINDINGS  There is gross patency of both internal carotid arteries. The left is dominant. Both anterior cerebrals fill from the left due to  an atretic right A1 ACA. Fusiform prominence left ICA terminus appears similar to prior angiographic films. Basilar artery widely patent with both vertebrals contributing. No right MCA stenosis or occlusion. No intracranial aneurysm. Both posterior cerebral arteries are widely patent.  There is no visible flow related enhancement of the left MCA proximally or distally. The left MCA proximally appears to have re-occluded.  IMPRESSION: Slight progression of intracranial multifocal left MCA territory infarcts, as described above. No hemorrhage or midline shift.  No appreciable flow related enhancement in the left MCA proximally or distally, suspect re-occlusion of the left MCA ; see discussion above.   Electronically Signed   By: Rolla Flatten M.D.   On: 05/08/2013 10:11   Mr Brain Wo Contrast  05/06/2013   CLINICAL DATA:  Stroke, 24 hr status post tPA. Right-sided weakness.  EXAM: MRI HEAD WITHOUT CONTRAST   MRA HEAD WITHOUT CONTRAST  TECHNIQUE: Multiplanar, multiecho pulse sequences of the brain and surrounding structures were obtained without intravenous contrast. Angiographic images of the head were obtained using MRA technique without contrast.  COMPARISON:  Prior CT from 05/05/2013 and MRI from 03/31/2008.  FINDINGS: MRI HEAD FINDINGS  The CSF containing spaces are within normal limits for patient age. No focal parenchymal signal abnormality is identified. 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 No other mass lesion identified. There is no midline shift, or extra-axial fluid collection. Ventricles are normal in size without evidence of hydrocephalus. The absence of the septum pellucidum noted.  There is abnormal restricted diffusion involving the left basal ganglia, most prevalent within the posterior aspect of the putamen. There is some involvement of the periventricular white matter anterior to the atrium of the left lateral ventricle. Scattered foci of restricted diffusion also seen within the insular cortex on the left bowel as well as the left temporoparietal region (series 3, image 19, 20). Additional small focus of restricted diffusion seen within the left centrum semi ovale of the left frontoparietal region (series 3, image 25). No evidence of hemorrhagic conversion. Tiny foci of hypointense signal intensity seen within the left putamen on GRE sequence likely represent associated petechial hemorrhages.  The cervicomedullary junction is normal. Pituitary gland is within normal limits. 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 Pituitary stalk is midline. The globes and optic nerves demonstrate a normal appearance with normal signal intensity. The  The bone marrow signal intensity is normal. Calvarium is intact. Visualized upper cervical spine is within normal limits.  Scalp soft tissues are unremarkable.  Paranasal sinuses are  clear.  No mastoid effusion.  MRA HEAD FINDINGS  The visualized distal cervical, petrous, cavernous, and supra clinoid segments of the internal carotid arteries are widely patent bilaterally with antegrade flow. The left A1 segment is widely patent. The right A1 segment is hypoplastic. Anterior communicating artery is normal. Anterior cerebral arteries are well opacified bilaterally.  There is focal proximal occlusion of the left M1 segment (series 6, image 71). No opacification of the left MCA territory artery branches seen distally.  The right M1 segment is well opacified with widely patent antegrade flow. Distal right MCA territory branches are within normal limits.  The left vertebral artery is dominant. Vertebral arteries are well opacified with widely patent antegrade flow. Posterior inferior cerebral arteries are within normal limits. Vertebrobasilar junction and basilar artery are normal. Posterior cerebral arteries are well opacified. Superior cerebellar and anterior inferior cerebellar arteries are normal.  No intracranial aneurysm identified.  IMPRESSION: MRI BRAIN:  1. 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 No evidence of hemorrhagic conversion. 2. 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 MRA BRAIN:  1. Complete occlusion of the proximal left M1 segment. 2. No other proximal branch occlusion or high-grade flow-limiting stenosis identified within the intracranial circulation. 3. No intracranial aneurysm.  Critical Value/emergent results were called by telephone at the time of interpretation on 05/06/2013 at 11:00 PM to Dr. Roland Rack , who verbally acknowledged these results.   Electronically Signed   By: Jeannine Boga M.D.   On: 05/06/2013 23:13   Dg Chest Port 1 View  05/07/2013   CLINICAL DATA:  Check endotracheal tube placement  EXAM: PORTABLE CHEST - 1 VIEW  COMPARISON:  05/06/2013  FINDINGS: An  endotracheal tube is now seen 5.1 cm above the carina. A nasogastric catheter is noted although the tip is coned off than the inferior aspect of the film. Proximal side hole lies within the distal esophagus. Patchy atelectatic changes are noted  IMPRESSION: Status post endotracheal tube and nasogastric catheter placement. Patchy atelectatic changes are noted bilaterally.   Electronically Signed   By: Inez Catalina M.D.   On: 05/07/2013 08:53   Dg Abd Portable 1v  05/07/2013   CLINICAL DATA:  Orogastric tube insertion  EXAM: PORTABLE ABDOMEN - 1 VIEW  COMPARISON:  None.  FINDINGS: OG tube tip is in the proximal stomach. Side port at the GE junction. Normal bowel gas pattern. Dense left lower lobe collapse/consolidation noted obscuring the left hemidiaphragm.  IMPRESSION: OG tube proximal stomach.  This could be advanced 7 cm.  Dense left base consolidation/collapse   Electronically Signed   By: Daryll Brod M.D.   On: 05/07/2013 08:51   Mr Jodene Nam Head/brain Wo Cm  05/06/2013   CLINICAL DATA:  Stroke, 24 hr status post tPA. Right-sided weakness.  EXAM: MRI HEAD WITHOUT CONTRAST  MRA HEAD WITHOUT CONTRAST  TECHNIQUE: Multiplanar, multiecho pulse sequences of the brain and surrounding structures were obtained without intravenous contrast. Angiographic images of the head were obtained using MRA technique without contrast.  COMPARISON:  Prior CT from 05/05/2013 and MRI from 03/31/2008.  FINDINGS: MRI HEAD FINDINGS  The CSF containing spaces are within normal limits for patient age. No focal parenchymal signal abnormality is identified. 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 No other mass lesion identified. There is no midline shift, or extra-axial fluid collection. Ventricles are normal in size without evidence of hydrocephalus. The absence of the septum pellucidum noted.  There is abnormal restricted diffusion involving the left basal ganglia, most  prevalent within the posterior aspect of the putamen. There is some involvement of the periventricular white matter anterior to the atrium of the left lateral ventricle. Scattered foci of restricted diffusion also seen within the insular cortex on the left bowel as well as the left temporoparietal region (series 3, image 19, 20). Additional small focus of restricted diffusion seen within the left centrum semi ovale of the left frontoparietal region (series 3, image 25). No evidence of hemorrhagic conversion. Tiny foci of hypointense signal intensity seen within the left putamen on GRE sequence likely represent associated petechial hemorrhages.  The cervicomedullary junction is normal. Pituitary gland is within normal limits. 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 Pituitary stalk is midline. The globes and optic nerves demonstrate a normal appearance with normal signal intensity. The  The bone marrow signal intensity is normal. Calvarium is intact. Visualized upper cervical spine is within normal limits.  Scalp soft tissues are unremarkable.  Paranasal sinuses are clear.  No mastoid effusion.  MRA HEAD FINDINGS  The visualized distal cervical, petrous, cavernous, and supra clinoid segments of the internal carotid arteries are widely patent bilaterally with antegrade flow. The left A1 segment is widely patent. The right A1 segment is hypoplastic. Anterior communicating artery is normal. Anterior cerebral arteries are well opacified bilaterally.  There is focal proximal occlusion of the left M1 segment (series 6, image 71). No opacification of the left MCA territory artery branches seen distally.  The right M1 segment is well opacified with widely patent antegrade flow. Distal right MCA territory branches are within normal limits.  The left vertebral artery is dominant. Vertebral arteries are well opacified with widely patent antegrade flow. Posterior inferior cerebral arteries are within normal limits.  Vertebrobasilar junction and basilar artery are normal. Posterior cerebral arteries are well opacified. Superior cerebellar and anterior inferior cerebellar arteries are normal.  No intracranial aneurysm identified.  IMPRESSION: MRI BRAIN:  1. 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 No evidence of hemorrhagic conversion. 2. 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 MRA BRAIN:  1. Complete occlusion of the proximal left M1 segment. 2. No other proximal branch occlusion or high-grade flow-limiting stenosis identified within the intracranial circulation. 3. No intracranial aneurysm.  Critical Value/emergent results were called by telephone at the time of interpretation on 05/06/2013 at 11:00 PM to Dr. Roland Rack , who verbally acknowledged these results.   Electronically Signed   By: Jeannine Boga M.D.   On: 05/06/2013 23:13    ASSESSMENT AND PLAN The patient is a 48 yo male with a hisotry of HTN, GERD, Brain lipoma, CHF, proteinuria, stomach ulcer, and leukopenia who presented to [facility] with right sided weakness on 05/06/13. He was diagnosed with a stroke and underwent TPA at [facility] and then was transferred to [facility] where he underwent left MCA revascularization with clot retrieval and stent placement in IR with Dr Estanislado Pandy. An ECHO revealed a EF 25% with diffuse hypokinesis. Cardiomyopathy of unknown etiology.   CVA:  Left MCA CVA with right hemiparesis, now s/p tPA then reinfarction and MCA stent. Will need Plavix x 3 months for MCA stent.  -- Concern that his CVA was cardioembolic given low EF. However, no thrombus was seen on echo. There is no definitive evidence on anticoagulation for CVA + cardiomyopathy in the absence of a visualized LV thrombus.   Cardiomyopathy:  Uncertain etiology. Patient says that he had been dyspneic with exertion for several months prior to admission. He had an echo at [facility] that  apparently showed a low EF. This admission, EF 25% with diffuse hypokinesis. He does not drink ETOH or use drugs. No family history of CMP. He has had longstanding HTN.  -- Cont home Lasix regimen 20mg  qd -- Cont Coreg, added Losartan  -- Will need a LHC before discharge to rule out CAD as cause of CM -- If cardiac cath shows no coronary disease, would do cardiac MRI to assess for infiltrative disease and also to get another look for LV thrombus.  -- HIV, SPEP/UPEP and ANA pending to assess for secondary causes of CMP.  -- Tele with frequent ventricular ectopy including frequent runs of vent trigeminy. Consider increasing BB as his BP does have room.   HTN -  Long-standing, ECG shows LVH with repolarization abnormality. -- Bp fairly controlled. Continue coreg 3.125mg  BID and losartan, spiro and lasix  Hypokalemia - resolved after K supplementation. He was also started on spironolactone yesterday which will hopefully help keep potassium normal.   Signed, Perry Mount PA-C  Pager [phone]  Agree with note by Lorretta Harp PA-C. Pt admitted with CVA Rx with tPA and intracranial rescue. His echo shows severe LV dysfunction w/o embolic source and carotids are non obstructive. He is dysarthric and paretic on the right. He denied CP PTA but did have DOE. I favor med Rx of CM and delay cath until he is rehabilitated from his CVA. 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  Lorretta Harp, M.D., [place], [facility], [place] [address] [place], [place]  [address]  [phone] 05/10/2013 4:10 PM

## 2013-05-10 NOTE — Progress Notes (Addendum)
Called regarding SOB and tachycardia. Patient complaining of anxiety this evening, then progressed to what he describes as a typical panic attack. He feels SOB and anxious. His BP went high into the 174Y systolic and his oxygen saturations fell to the 80s but responded well to 3L O2 via [place].   Slightly decreased BS at the bases.   1) ABG 2) BMP 3) ionized calcium 4) CTA to rule out PE given anxiety + new oxygen requirement.   Roland Rack, MD [facility] [phone]  If 7pm- 7am, please page neurology on call as listed in [place].
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
## 2013-05-10 NOTE — Significant Event (Signed)
Rapid Response Event Note  Overview: Time Called: 3704 Arrival Time: 0240 Event Type: Other (Comment)  Initial Focused Assessment:  Called by Merleen Nicely, RN to assess pt who was having a "Panic attack" States pt has had increased anxiety all evening with SBP 200s and O2 sats 80's.  On arrival to room pt is sitting up in bed with his eyes closed.  Responds to voice,oriented times 3, falls asleep during interview.  Pt received Ativan 1 mg po at 2200 and .5 mg po at 0057.  Pt denies pain but states he feels "a little" SOB. VS at 0235;  134/94--pt received Labetolol 10 mg IV at 0203 and 10 mg at 0219.  SR 88 RR 22 with O2 sats 94 % on 2 l [place].  Bilat BS clear, diminished in bilat bases. Dr Orville Govern at bedside, ordered ABG with results:  Ph 7.416, Po2 165, PCO2 32.4 on 3l [place] .  BMET with results:NA 139, K 3.2, CO2 17 CA7.3 and glucose 167  Pt was then taken to Radiology for CT angio which proved negative for PE.  Returned to floor clam and cooperative.  Hand off report given to Merleen Nicely, RN   Interventions:   Event Summary: Name of Physician Notified: Dr Kathrynn Speed at [phone]    at    Outcome: Stayed in room and stabalized  Event End Time: 0330  Ester Rink
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
## 2013-05-10 NOTE — Progress Notes (Signed)
Pt stating he has been anxious all day and repeating since arrival of shift. Wife concerned about his anxiety, pt was getting 1mg  ativan q6hr. After night time medication was delivered and another dose of ativan, pt called 2 hours after complaining of not being able to sleep. Pt states he hasn't slept since hospitalization. Paged Leonel Ramsay and order for another 0.5 mg ativan dose, gave pt and educated on breathing techniques to help with sleep. 0215 pt calls for RN, pt states "I am having an anxiety attack". Paged MD for order for something else for sleep, got ambien 5 mg, but was not administered. Pt vitals were not within parameters so after consultation with charge RN, paged rapid response and MD for both to come see pt. Upon arrival pt seemed somewhat sleepy and vitals were down, ct angio ordered for possibility of PE with symptoms of tachycardia, hypertension, and SOB. After transport to CT, rapid response left and pt started complaining of nausea (administered zofran), pt still complains of no sleep. Calcium gluconate started after sent a second dose due to first dose was leaking. 0500 and pt still complains of being hot and not sleeping. Will continue to monitor.

## 2013-05-10 NOTE — Progress Notes (Signed)
Physical Therapy Treatment Patient Details Name: Isaac Johnson MRN: [id number] DOB: 12/16/1965 Today's Date: 05/10/2013    History of Present Illness Isaac Johnson is a 48 y.o. male with a history of hypertension who presented with right sided weakness 05/05/2013. [name] He was taken to [facility] emergency department where he was seen to have severe right-sided weakness. TPA was initiated at [facility] and he was transferred to [facility] for further management. MRI/MRAI: Scattered acute ischemic infarcts involving the left basal ganglia and left MCA territory; Complete occlusion of the proximal left M1 segment. Underwent L MCA revascularization 4/4 early am    PT Comments    Patient progressing well and eager to get out of bed to recliner. Upon entering room noted patient with breakfast in front of him with no supervision for eating. Patient stated that he did choke once but no one was in room. Also there was evidence of pocketing, assistance to clear. RN made aware  Follow Up Recommendations  CIR     Equipment Recommendations  Wheelchair (measurements PT);Wheelchair cushion (measurements PT)    Recommendations for Other Services       Precautions / Restrictions Precautions Precautions: Fall Precaution Comments: Rt hemiparesis    Mobility  Bed Mobility Overal bed mobility: Needs Assistance;+ 2 for safety/equipment Bed Mobility: Rolling;Sidelying to Sit   Sidelying to sit: Mod assist;+2 for safety/equipment       General bed mobility comments: Patient cued for sequency and use of L LE and UE. A for R LE out of bed and for trunk support into upright sitting.  Transfers Overall transfer level: Needs assistance Equipment used: 2 person hand held assist   Sit to Stand: Mod assist;+2 physical assistance Stand pivot transfers: +2 physical assistance;Mod assist       General transfer comment: A to initiate stand and to block R LE. Cues to keep trunk and shift weight anteriorly with stand.  Patient able to move L LE in small pivots to recliner.   Ambulation/Gait                 Stairs            Wheelchair Mobility    Modified Rankin (Stroke Patients Only) Modified Rankin (Stroke Patients Only) Pre-Morbid Rankin Score: No symptoms Modified Rankin: Severe disability     Balance     Sitting balance-Leahy Scale: Fair Sitting balance - Comments: sitting EOB x5 mins for postural control and COG     Standing balance-Leahy Scale: Poor                      Cognition Arousal/Alertness: Awake/alert Behavior During Therapy: WFL for tasks assessed/performed Overall Cognitive Status: Impaired/Different from baseline Area of Impairment: Problem solving   Current Attention Level: Selective         Problem Solving: Slow processing;Difficulty sequencing;Requires verbal cues      Exercises      General Comments        Pertinent Vitals/Pain no apparent distress     Home Living                      Prior Function            PT Goals (current goals can now be found in the care plan section) Progress towards PT goals: Progressing toward goals    Frequency  Min 4X/week    PT Plan Current plan remains appropriate    Co-evaluation  End of Session Equipment Utilized During Treatment: Gait belt Activity Tolerance: Patient tolerated treatment well Patient left: in chair;with call bell/phone within reach     Time: 1011-1026 PT Time Calculation (min): 15 min  Charges:  $Gait Training: 8-22 mins                    G Codes:      Jacqualyn Posey 05/10/2013, 11:46 AM 05/10/2013 Jacqualyn Posey PTA [phone] pager [phone] office

## 2013-05-11 DIAGNOSIS — I519 Heart disease, unspecified: Secondary | ICD-10-CM

## 2013-05-11 LAB — PROTEIN ELECTROPHORESIS, SERUM
ALBUMIN ELP: 53.8 % — AB (ref 55.8–66.1)
ALPHA-1-GLOBULIN: 6.3 % — AB (ref 2.9–4.9)
ALPHA-2-GLOBULIN: 11.1 % (ref 7.1–11.8)
BETA 2: 5.1 % (ref 3.2–6.5)
BETA GLOBULIN: 5.9 % (ref 4.7–7.2)
Gamma Globulin: 17.8 % (ref 11.1–18.8)
M-Spike, %: NOT DETECTED g/dL
TOTAL PROTEIN ELP: 5.7 g/dL — AB (ref 6.0–8.3)

## 2013-05-11 MED ORDER — ALPRAZOLAM 0.25 MG PO TABS
0.2500 mg | ORAL_TABLET | Freq: Two times a day (BID) | ORAL | Status: DC
  Administered 2013-05-11 – 2013-05-12 (×3): 0.25 mg via ORAL
  Filled 2013-05-11 (×3): qty 1

## 2013-05-11 NOTE — Progress Notes (Signed)
Please see CIR consult note from yesterday.  He is a great CIR candidate and Rehab CM to follow up on prior authorization from [place].

## 2013-05-11 NOTE — PMR Pre-admission (Signed)
PMR Admission Coordinator Pre-Admission Assessment  Patient: Isaac Johnson is an 48 y.o., male MRN: [id number] DOB: [birth year] Height: 6' (182.9 cm) Weight: 77.3 kg (170 lb 6.7 oz)              Insurance Information HMO:    PPO:  yes     PCP:      IPA:      80/20:      OTHER:   PRIMARY:  [employer] [place]      Policy#:  [id number]      Subscriber:  self CM Name: Doroteo Glassman     Phone#: [phone] ext tba     Fax#: [phone] Pre-Cert#:  [id number]     Employer: [employer]  Update in 14 days. Benefits:  Phone #: [phone]      Name: Langston Reusing. Date: 02/03/13     Deduct: $500      Out of Pocket Max: $3500      Life Max: unlimited CIR: 80%  with  preauthorization   SNF: 80% with preauthorization Outpatient: 80%    Co-Pay: 20% Home Health: 80% with proauthorization     Co-Pay: 20% DME: 80%     Co-Pay: 20% Providers:  In network  SECONDARY:        Medicaid Application Date:       Case Manager:  Disability Application Date:       Case Worker:  I asked wife to talk to [employer] and obtain paperwork for short term disability  Emergency Contact Information Contact Information   Name Relation Home Work Mobile   Hinkley,Detra Spouse [phone]  [phone]     Current Medical History  Patient Admitting Diagnosis: 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 with history of CHF, stomach ulcers, sciatica LLE, brain lipoma; who was admitted to [facility] on 05/06/13 with right side weakness. He was treated with tPA and transferred to [facility] for further treatment. CT head negative for acute changes. 2D echo with EF 25% with diffuse hypokinesis and grade 2 diastolic dysfunction. Carotid dopplers without significant ICA stenosis. Patient with improvement after tPA . 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 Estanislado Pandy.   He was evaluated by Dr. Aundra Dubin who who recommends heart cath after rehab recovery as an outpt. to rule out CAD as cause of CM and expressed concerns that CVA could be cardioembolic due to low EF. Medications adjusted and . He was started on ASA and plavix for secondary stroke prevention given stent in place. He has had high levels of anxiety with SOB (hyperventilating?) requiring ativan. CTA of chest negative for PE but with evidence of nodal prominence of questionable etiology.   Total: 12  NIH    Past Medical History  Past Medical History  Diagnosis Date  . Hypertension   . Stomach ulcer   . Gait instability   . Proteinuria   . GERD (gastroesophageal reflux disease)   . Insomnia   . Dizziness   . Leukopenia   . Sciatica     left  . Brain lipoma  2010    Dr. Trenton Gammon  . CHF (congestive heart failure)     Family History  family history includes Gout in his father; Stroke (age of onset: 21) in his mother.  Prior Rehab/Hospitalizations: no prior rehab    Current Medications  Current facility-administered medications:acetaminophen (TYLENOL) suppository 650 mg, 650 mg, Rectal, Q4H PRN, Roland Rack, MD, 650 mg at 05/07/13 2213;  acetaminophen (TYLENOL) tablet 650 mg, 650 mg, Oral, Q4H PRN, Roland Rack, MD;  ALPRAZolam Duanne Moron) tablet 0.25 mg, 0.25 mg, Oral, BID, Donzetta Starch, NP, 0.25 mg at 05/12/13 1021 alum & mag hydroxide-simeth (MAALOX/MYLANTA) 200-200-20 MG/5ML suspension 15 mL, 15 mL, Oral, Q6H PRN, Early Chars Rinehuls, PA-C, 15 mL at 05/08/13 2334;  aspirin tablet 325 mg, 325 mg, Oral, Q breakfast, Rob Hickman, MD, 325 mg at 05/12/13 1021;  bisacodyl (DULCOLAX)  suppository 10 mg, 10 mg, Rectal, Daily PRN, Roland Rack, MD, 10 mg at 05/10/13 1945 carvedilol (COREG) tablet 3.125 mg, 3.125 mg, Oral, BID WC, David L Rinehuls, PA-C, 3.125 mg at 05/12/13 1021;  clopidogrel (PLAVIX) tablet 75 mg, 75 mg, Oral, Q breakfast, Rob Hickman, MD, 75 mg at 05/12/13 1021;  feeding supplement (ENSURE COMPLETE) (ENSURE COMPLETE) liquid 237 mL, 237 mL, Oral, BID BM, Heather Cornelison Pitts, RD, 237 mL at 05/12/13 1402 furosemide (LASIX) tablet 20 mg, 20 mg, Oral, Daily, Tarri Fuller, PA-C, 20 mg at 05/12/13 1022;  labetalol (NORMODYNE,TRANDATE) injection 10 mg, 10 mg, Intravenous, Q10 min PRN, Roland Rack, MD, 10 mg at 05/10/13 0219;  LORazepam (ATIVAN) tablet 1 mg, 1 mg, Oral, Q6H PRN, Wallie Char, 1 mg at 05/12/13 1402;  losartan (COZAAR) tablet 25 mg, 25 mg, Oral, Daily, Tarri Fuller, PA-C, 25 mg at 05/12/13 1022 ondansetron (ZOFRAN) injection 4 mg, 4 mg, Intravenous, Q6H PRN, Wallie Char, 4 mg at 05/10/13 0347;  pantoprazole (PROTONIX) EC tablet 80 mg, 80 mg, Oral, QHS, David L Rinehuls, PA-C, 80 mg at 05/11/13 2357;  polyethylene glycol (MIRALAX / GLYCOLAX) packet 17 g, 17 g, Oral, Daily, Roland Rack, MD;  spironolactone (ALDACTONE) tablet 12.5 mg, 12.5 mg, Oral, Daily, Tarri Fuller, PA-C, 12.5 mg at 05/12/13 1022 zolpidem (AMBIEN) tablet 5 mg, 5 mg, Oral, QHS PRN, Donzetta Starch, NP, 5 mg at 05/11/13 0008  Patients Current Diet: Dysphagia 2 diet with thin liquids  Precautions / Restrictions Precautions Precautions: Fall Precautions/Special Needs: Swallowing Precaution Comments: Rt hemiparesis Restrictions Weight Bearing Restrictions: No   Prior Activity Level Community (5-7x/wk): Pt. is employed as a Quarry manager at [employer].  Was active and out of the house almost daily per pt. and wife.  Home Assistive Devices / Equipment Home Assistive Devices/Equipment: Eyeglasses Home Equipment: None  Prior Functional Level Prior Function Level  of Independence: Independent Comments: Pt works as a Quarry manager at [employer] in [place].    Current Functional Level Cognition  Arousal/Alertness: Awake/alert Overall Cognitive Status: Impaired/Different from baseline Current Attention Level: Selective Orientation Level: Oriented X4 General Comments: Patient has difficulty sequencing tasks. Attention: Alternating Alternating Attention: Impaired Alternating Attention Impairment: Verbal complex;Functional basic;Functional complex Memory: Impaired Memory Impairment: Decreased recall of new information Awareness: Appears intact Problem Solving: Impaired Problem Solving Impairment: Verbal complex;Functional complex Executive Function: Writer: Impaired Organizing Impairment: Verbal complex Safety/Judgment: Appears intact    Extremity Assessment (includes Sensation/Coordination)          ADLs  Overall ADL's : Needs assistance/impaired Eating/Feeding: Supervision/ safety;Set up;Sitting Grooming: Wash/dry face;Set up;Supervision/safety;Sitting (applied lotion) Grooming Details (indicate cue type and reason): using Lt.  UE (is Rt. hand dominant) Upper Body Bathing: Moderate assistance;Sitting (chair level) Lower Body Bathing: Moderate assistance;Sit to/from stand Upper Body Dressing : Set up;Supervision/safety;Sitting Lower Body Dressing: Minimal assistance;Sitting/lateral leans (socks) Toilet Transfer: Squat-pivot;Maximal assistance;+2 for physical assistance;Moderate assistance;+2 for safety/equipment (from bed to chair) Toileting- Clothing Manipulation and Hygiene: Sitting/lateral lean;Moderate assistance (toilet hygenie; mod A to stabilize R side for lateral lean) Functional mobility during ADLs: Moderate assistance;+2 for physical assistance;Cueing for sequencing;Cueing for safety General ADL Comments: Pt washed RUE sitting in chair with setup of RUE provided. Pt cued to use R hand to stabilize soap container, trace  movement noted when asked to turn bottle to get soap out. Facilitated wb through RUE/RLE during self care. Pt completed perianal care with right lateral lean in chair with therapist stabilizing and promoting wb through [place].    Mobility  Overal bed mobility: Needs Assistance Bed Mobility: Sit to Supine Rolling: Min assist;Mod assist (Min A to right; Mod A to left) Sidelying to sit: Mod assist;+2 for safety/equipment Supine to sit: Mod assist Sit to supine: Mod assist General bed mobility comments: max A to scoot up in bed. supine to sit with mod A.    Transfers  Overall transfer level: Needs assistance Equipment used: 2 person hand held assist Transfers: Sit to/from [facility] Sit to Stand: +2 physical assistance;Min assist Stand pivot transfers: +2 physical assistance;Mod assist Squat pivot transfers: Mod assist;+2 physical assistance General transfer comment: cues for sequencing, technique. R knee blocked in standing.    Ambulation / Gait / Stairs / Office manager / Balance Dynamic Sitting Balance Sitting balance - Comments: Patient EOB x 5 min. Patient required cues for awareness of postural control. A required for safety EOB sitting.     Special needs/care consideration BiPAP/CPAP  no CPM  no Continuous Drip IV  no Dialysis  no         Life Vest  no Oxygen  2L O2 nasal cannula Special Bed  no Trach Size  no Wound Vac (area)  no       Skin  no                              Location  no Bowel mgmt:  Last BM 05/10/13 Bladder mgmt: condom cath Diabetic mgmt  no     Previous Home Environment Living Arrangements: Spouse/significant other;Children;Other (Comment) (76 year old daughter)  Lives With: Spouse;Daughter Available Help at Discharge: Available PRN/intermittently;Family (wire works 2nd shift at [employer]) Type of Home: [place]: One level Home Access: Stairs to enter Entrance Stairs-Rails: None Technical brewer of Steps:  3 Bathroom Shower/Tub: Sports coach: Yes How Accessible: Accessible via walker [place]: No  Discharge Living Setting Plans for Discharge Living Setting: Patient's home Type of Home at Discharge: House Discharge Home Layout: One level Discharge Home Access: Stairs to enter Entrance Stairs-Rails: None Entrance Stairs-Number of Steps: 3 Discharge Bathroom Shower/Tub: Tub/shower unit Discharge Bathroom Toilet: Standard Discharge Bathroom Accessibility: Yes How Accessible: Accessible via walker Does the patient have any problems obtaining your medications?: No  Social/Family/Support Systems Patient Roles: Spouse;Parent (full time employee) Caregiver Availability: Other (Comment) (works 2nd shift) Discharge Plan Discussed with Primary Caregiver: Yes Is Caregiver In Agreement with Plan?: Yes Does Caregiver/Family have Issues with Lodging/Transportation while Pt is in Rehab?: No    Goals/Additional Needs Patient/Family Goal for Rehab: supervision to  min assist for PT/OT, supervision for ST Expected length of stay: 20-24 days Cultural Considerations: no Dietary Needs: dysphagia 2 with thin  liquids Pt/Family Agrees to Admission and willing to participate: Yes Program Orientation Provided & Reviewed with Pt/Caregiver Including Roles  & Responsibilities: Yes   Decrease burden of Care through IP rehab admission: n/a  Possible need for SNF placement upon discharge:not anticipated. Wife is discucussing with two people to assist her when she is working.   Patient Condition: This patient's condition remains as documented in the consult dated 05/09/13, in which the Rehabilitation Physician determined and documented that the patient's condition is appropriate for intensive rehabilitative care in an inpatient rehabilitation facility. Will admit to inpatient rehab today.  Preadmission Screen Completed By:  Dot Been , 05/12/2013 3:10 PM ______________________________________________________________________   Discussed status with Dr. Letta Pate on 05/12/13 at  1510 and received telephone approval for admission today.  Admission Coordinator:  Cleatrice Burke, PT time 1610 Date 05/12/13.

## 2013-05-11 NOTE — Progress Notes (Signed)
Occupational Therapy Treatment Patient Details Name: Isaac Johnson MRN: [id number] DOB: [birth year] Today's Date: 05/11/2013    History of present illness Isaac Johnson is a 48 y.o. male with a history of hypertension who presented with right sided weakness 05/05/2013. [name] He was taken to [facility] emergency department where he was seen to have severe right-sided weakness. TPA was initiated at [facility] and he was transferred to [facility] for further management. MRI/MRAI: Scattered acute ischemic infarcts involving the left basal ganglia and left MCA territory; Complete occlusion of the proximal left M1 segment. Underwent L MCA revascularization 4/4 early am   OT comments  Pt progressing towards acute OT goals. Therapeutic activities during session to promote weightbearing through R side during functional activities. Pt motivated to perform during session. Feel pt continues to be an excellent candidate for CIR.  Follow Up Recommendations  CIR;Supervision/Assistance - 24 hour    Equipment Recommendations  3 in 1 bedside comode;Tub/shower bench    Recommendations for Other Services      Precautions / Restrictions Precautions Precautions: Fall Precaution Comments: Rt hemiparesis Restrictions Weight Bearing Restrictions: No       Mobility Bed Mobility Overal bed mobility: Needs Assistance Bed Mobility: Sit to Supine       Sit to supine: Mod assist   General bed mobility comments: max A to scoot up in bed. supine to sit with mod A.  Transfers Overall transfer level: Needs assistance Equipment used: 2 person hand held assist Transfers: Sit to/from [facility] Sit to Stand: +2 physical assistance;Min assist Stand pivot transfers: +2 physical assistance;Mod assist       General transfer comment: cues for sequencing, technique. R knee blocked in standing.    Balance Overall balance assessment: Needs assistance Sitting-balance support: Single extremity supported;Feet  supported Sitting balance-Leahy Scale: Fair     Standing balance support: During functional activity;Bilateral upper extremity supported Standing balance-Leahy Scale: Poor Standing balance comment: +2 min A in standing. R knee buckles as needs blocking in standing.                    ADL Overall ADL's : Needs assistance/impaired         Upper Body Bathing: Moderate assistance;Sitting (chair level)                   Toileting- Clothing Manipulation and Hygiene: Sitting/lateral lean;Moderate assistance (toilet hygenie; mod A to stabilize R side for lateral lean)       Functional mobility during ADLs: Moderate assistance;+2 for physical assistance;Cueing for sequencing;Cueing for safety General ADL Comments: Pt washed RUE sitting in chair with setup of RUE provided. Pt cued to use R hand to stabilize soap container, trace movement noted when asked to turn bottle to get soap out. Facilitated wb through RUE/RLE during self care. Pt completed perianal care with right lateral lean in chair with therapist stabilizing and promoting wb through [place].      Vision                     Perception     Praxis      Cognition   Behavior During Therapy: Flat affect Overall Cognitive Status: Impaired/Different from baseline Area of Impairment: Problem solving              Problem Solving: Slow processing      Extremity/Trunk Assessment               Exercises  Shoulder Instructions       General Comments      Pertinent Vitals/ Pain       No c/o pain.  Home Living   Living Arrangements: Spouse/significant other;Children;Other (Comment) (22 year old daughter) Available Help at Discharge: Available PRN/intermittently;Family (wire works 2nd shift at [employer])                     [employer] Accessibility: Yes How Accessible: Accessible via walker            Prior Functioning/Environment              Frequency Min 3X/week      Progress Toward Goals  OT Goals(current goals can now be found in the care plan section)  Progress towards OT goals: Progressing toward goals  Acute Rehab OT Goals Patient Stated Goal: not stated OT Goal Formulation: With patient Time For Goal Achievement: 05/23/13 Potential to Achieve Goals: Good ADL Goals Pt Will Perform Upper Body Bathing: with min assist;sitting Pt Will Perform Lower Body Bathing: with min assist;sit to/from stand Pt Will Perform Upper Body Dressing: with min assist;sitting Pt Will Perform Lower Body Dressing: with min assist;sit to/from stand Pt Will Transfer to Toilet: with min assist;stand pivot transfer;bedside commode Pt Will Perform Toileting - Clothing Manipulation and hygiene: with min assist;sit to/from stand Pt/caregiver will Perform Home Exercise Program: Right Upper extremity;Independently (self ROM/PROM) Additional ADL Goal #1: Pt will use Rt. UE as a stabilizer/support Rt. UE 50% of time during ADL activity  Plan Discharge plan remains appropriate    Co-evaluation                 End of Session Equipment Utilized During Treatment: Gait belt   Activity Tolerance Patient limited by fatigue   Patient Left in bed;with call bell/phone within reach;with family/visitor present;with bed alarm set   Nurse Communication          Time: 4268-3419 OT Time Calculation (min): 19 min  Charges: OT General Charges $OT Visit: 1 Procedure OT Treatments $Therapeutic Activity: 8-22 mins  Tyrone Schimke OTR/L Pager: [phone]  05/11/2013, 4:34 PM
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
## 2013-05-11 NOTE — Progress Notes (Signed)
Physical Therapy Treatment Patient Details Name: Isaac Johnson MRN: [id number] DOB: 1965/07/10 Today's Date: 05/11/2013    History of Present Illness Isaac Johnson is a 48 y.o. male with a history of hypertension who presented with right sided weakness 05/05/2013. [name] He was taken to [facility] emergency department where he was seen to have severe right-sided weakness. TPA was initiated at [facility] and he was transferred to [facility] for further management. MRI/MRAI: Scattered acute ischemic infarcts involving the left basal ganglia and left MCA territory; Complete occlusion of the proximal left M1 segment. Underwent L MCA revascularization 4/4 early am    PT Comments    Patient tolerated treatment today. Patient slowly progressing towards goals. Patient able to activity move L side with limited motions and only in certain planes. Patient still requires cues for technique and A for safety. Nurse came in during session to check vitals and MD came in to discuss medications. Patient would benefit from more therapy to focus on safety. Recommend CIR at this time for more skilled interventions.    Follow Up Recommendations  CIR     Equipment Recommendations  Wheelchair (measurements PT);Wheelchair cushion (measurements PT)    Recommendations for Other Services       Precautions / Restrictions Precautions Precautions: Fall Precaution Comments: Rt hemiparesis Restrictions Weight Bearing Restrictions: No    Mobility  Bed Mobility Overal bed mobility: Needs Assistance Bed Mobility: Supine to Sit   Sidelying to sit: Mod assist;+2 for safety/equipment Supine to sit: Mod assist     General bed mobility comments: Assistance with trunk as pt leaning back upon sitting and helped with LE.   Transfers Overall transfer level: Needs assistance Equipment used: 2 person hand held assist Transfers: Sit to/from [facility] Sit to Stand: Mod assist;+2 safety/equipment Stand pivot  transfers: +2 physical assistance;Mod assist Squat pivot transfers: Mod assist;+2 physical assistance     General transfer comment: Cues for technique. Decreased control of RLE when standing. Patient RLE buckling with standing transfer.  Ambulation/Gait                 Stairs            Wheelchair Mobility    Modified Rankin (Stroke Patients Only)       Balance Overall balance assessment: Needs assistance Sitting-balance support: Single extremity supported;Feet supported Sitting balance-Leahy Scale: Fair Sitting balance - Comments: Patient EOB x 5 min. Patient required cues for awareness of postural control. A required for safety EOB sitting.  Postural control: Right lateral lean Standing balance support: Single extremity supported (LUE support with bed rail) Standing balance-Leahy Scale: Poor Standing balance comment: Patient unable to maintain balance in standing without A due to R knee buckling. Patient required 2 person A in WS exercises.                    Cognition Arousal/Alertness: Awake/alert Behavior During Therapy: Flat affect Overall Cognitive Status: Impaired/Different from baseline               Problem Solving: Slow processing General Comments: Patient has difficulty sequencing tasks.    Exercises Total Joint Exercises Ankle Circles/Pumps: PROM;Right;10 reps Long Arc Quad: Both;10 reps;AROM;PROM;AAROM (Patient able to initate movement LLE. PROM x 5, AAROM x 5, AROM x10 ) Marching in Standing: PROM;Right;5 reps    General Comments        Pertinent Vitals/Pain Patient does not appear in acute distress.    Home Living  Prior Function            PT Goals (current goals can now be found in the care plan section) Progress towards PT goals: Progressing toward goals    Frequency  Min 4X/week    PT Plan Current plan remains appropriate    Co-evaluation             End of Session  Equipment Utilized During Treatment: Gait belt Activity Tolerance: Patient tolerated treatment well Patient left: in chair;with call bell/phone within reach;with chair alarm set;with nursing/sitter in room     Time: 8242-3536 PT Time Calculation (min): 33 min  Charges:                       G Codes:      [place], SPTA 05/11/2013, 10:25 AM

## 2013-05-11 NOTE — Progress Notes (Signed)
Seen and agreed 05/11/2013 Julia Elizabeth Robinette PTA [phone] pager [phone] office    

## 2013-05-11 NOTE — Progress Notes (Signed)
Inpatient Rehabilitation  I met with the patient at the bedside and spoke with his wife over the phone to discuss [name] post acute rehab options.  They  prefer to have pt. come to CIR for his rehab recovery.  I have submitted for [employer] authorization and await their decision.  Will have my co-worker Danne Baxter  follow up tomorrow in my absence. Please call if questions.  [place] Admissions Coordinator Cell [phone] Office [phone]

## 2013-05-12 ENCOUNTER — Inpatient Hospital Stay (HOSPITAL_COMMUNITY)
Admission: RE | Admit: 2013-05-12 | Discharge: 2013-05-23 | DRG: 945 | Disposition: A | Discharge status: Home / Self Care | Payer: BC Managed Care – PPO | Source: Intra-hospital | Attending: Physical Medicine & Rehabilitation | Admitting: Physical Medicine & Rehabilitation

## 2013-05-12 DIAGNOSIS — R110 Nausea: Secondary | ICD-10-CM | POA: Diagnosis not present

## 2013-05-12 DIAGNOSIS — I63519 Cerebral infarction due to unspecified occlusion or stenosis of unspecified middle cerebral artery: Secondary | ICD-10-CM | POA: Diagnosis present

## 2013-05-12 DIAGNOSIS — Z5189 Encounter for other specified aftercare: Principal | ICD-10-CM

## 2013-05-12 DIAGNOSIS — R1310 Dysphagia, unspecified: Secondary | ICD-10-CM | POA: Diagnosis present

## 2013-05-12 DIAGNOSIS — F411 Generalized anxiety disorder: Secondary | ICD-10-CM | POA: Diagnosis not present

## 2013-05-12 DIAGNOSIS — G8190 Hemiplegia, unspecified affecting unspecified side: Secondary | ICD-10-CM | POA: Diagnosis present

## 2013-05-12 DIAGNOSIS — I69959 Hemiplegia and hemiparesis following unspecified cerebrovascular disease affecting unspecified side: Secondary | ICD-10-CM

## 2013-05-12 DIAGNOSIS — R51 Headache: Secondary | ICD-10-CM | POA: Diagnosis not present

## 2013-05-12 DIAGNOSIS — R001 Bradycardia, unspecified: Secondary | ICD-10-CM

## 2013-05-12 DIAGNOSIS — I6350 Cerebral infarction due to unspecified occlusion or stenosis of unspecified cerebral artery: Secondary | ICD-10-CM

## 2013-05-12 DIAGNOSIS — I10 Essential (primary) hypertension: Secondary | ICD-10-CM | POA: Diagnosis present

## 2013-05-12 DIAGNOSIS — K219 Gastro-esophageal reflux disease without esophagitis: Secondary | ICD-10-CM | POA: Diagnosis present

## 2013-05-12 DIAGNOSIS — I7090 Unspecified atherosclerosis: Secondary | ICD-10-CM | POA: Diagnosis present

## 2013-05-12 DIAGNOSIS — N390 Urinary tract infection, site not specified: Secondary | ICD-10-CM | POA: Diagnosis not present

## 2013-05-12 DIAGNOSIS — I5021 Acute systolic (congestive) heart failure: Secondary | ICD-10-CM | POA: Diagnosis present

## 2013-05-12 DIAGNOSIS — I6340 Cerebral infarction due to embolism of unspecified cerebral artery: Secondary | ICD-10-CM | POA: Diagnosis present

## 2013-05-12 DIAGNOSIS — I69922 Dysarthria following unspecified cerebrovascular disease: Secondary | ICD-10-CM

## 2013-05-12 DIAGNOSIS — I509 Heart failure, unspecified: Secondary | ICD-10-CM | POA: Diagnosis present

## 2013-05-12 DIAGNOSIS — I428 Other cardiomyopathies: Secondary | ICD-10-CM | POA: Diagnosis present

## 2013-05-12 DIAGNOSIS — I519 Heart disease, unspecified: Secondary | ICD-10-CM

## 2013-05-12 DIAGNOSIS — I69991 Dysphagia following unspecified cerebrovascular disease: Secondary | ICD-10-CM

## 2013-05-12 DIAGNOSIS — I5022 Chronic systolic (congestive) heart failure: Secondary | ICD-10-CM | POA: Diagnosis present

## 2013-05-12 DIAGNOSIS — I639 Cerebral infarction, unspecified: Secondary | ICD-10-CM

## 2013-05-12 HISTORY — DX: Acute systolic (congestive) heart failure: I50.21

## 2013-05-12 MED ORDER — TRAZODONE HCL 50 MG PO TABS
50.0000 mg | ORAL_TABLET | Freq: Once | ORAL | Status: AC
  Administered 2013-05-12: 50 mg via ORAL
  Filled 2013-05-12: qty 1

## 2013-05-12 MED ORDER — BISACODYL 10 MG RE SUPP
10.0000 mg | Freq: Every day | RECTAL | Status: DC | PRN
  Administered 2013-05-14: 10 mg via RECTAL
  Filled 2013-05-12: qty 1

## 2013-05-12 MED ORDER — SPIRONOLACTONE 12.5 MG HALF TABLET
12.5000 mg | ORAL_TABLET | Freq: Every day | ORAL | Status: DC
  Administered 2013-05-13 – 2013-05-22 (×10): 12.5 mg via ORAL
  Filled 2013-05-12 (×12): qty 1

## 2013-05-12 MED ORDER — CLOPIDOGREL BISULFATE 75 MG PO TABS
75.0000 mg | ORAL_TABLET | Freq: Every day | ORAL | Status: DC
  Administered 2013-05-13 – 2013-05-22 (×10): 75 mg via ORAL
  Filled 2013-05-12 (×12): qty 1

## 2013-05-12 MED ORDER — LORAZEPAM 1 MG PO TABS
1.0000 mg | ORAL_TABLET | Freq: Four times a day (QID) | ORAL | Status: DC | PRN
  Administered 2013-05-14 (×2): 1 mg via ORAL
  Filled 2013-05-12 (×2): qty 1

## 2013-05-12 MED ORDER — ACETAMINOPHEN 325 MG PO TABS
650.0000 mg | ORAL_TABLET | ORAL | Status: DC | PRN
  Administered 2013-05-18 – 2013-05-22 (×3): 650 mg via ORAL
  Filled 2013-05-12 (×3): qty 2

## 2013-05-12 MED ORDER — LOSARTAN POTASSIUM 25 MG PO TABS
25.0000 mg | ORAL_TABLET | Freq: Every day | ORAL | Status: DC
  Administered 2013-05-13 – 2013-05-22 (×10): 25 mg via ORAL
  Filled 2013-05-12 (×12): qty 1

## 2013-05-12 MED ORDER — CARVEDILOL 3.125 MG PO TABS
3.1250 mg | ORAL_TABLET | Freq: Two times a day (BID) | ORAL | Status: DC
  Administered 2013-05-13 – 2013-05-22 (×18): 3.125 mg via ORAL
  Filled 2013-05-12 (×23): qty 1

## 2013-05-12 MED ORDER — FUROSEMIDE 20 MG PO TABS
20.0000 mg | ORAL_TABLET | Freq: Every day | ORAL | Status: DC
  Administered 2013-05-13 – 2013-05-17 (×5): 20 mg via ORAL
  Filled 2013-05-12 (×6): qty 1

## 2013-05-12 MED ORDER — ONDANSETRON HCL 4 MG PO TABS
4.0000 mg | ORAL_TABLET | Freq: Four times a day (QID) | ORAL | Status: DC | PRN
  Administered 2013-05-13 – 2013-05-20 (×9): 4 mg via ORAL
  Filled 2013-05-12 (×11): qty 1

## 2013-05-12 MED ORDER — SORBITOL 70 % SOLN
30.0000 mL | Freq: Every day | Status: DC | PRN
  Administered 2013-05-16 – 2013-05-18 (×2): 30 mL via ORAL
  Filled 2013-05-12 (×3): qty 30

## 2013-05-12 MED ORDER — PANTOPRAZOLE SODIUM 40 MG PO TBEC
80.0000 mg | DELAYED_RELEASE_TABLET | Freq: Every day | ORAL | Status: DC
  Administered 2013-05-12 – 2013-05-22 (×11): 80 mg via ORAL
  Filled 2013-05-12 (×12): qty 2

## 2013-05-12 MED ORDER — POLYETHYLENE GLYCOL 3350 17 G PO PACK
17.0000 g | PACK | Freq: Every day | ORAL | Status: DC
  Administered 2013-05-14 – 2013-05-22 (×9): 17 g via ORAL
  Filled 2013-05-12 (×12): qty 1

## 2013-05-12 MED ORDER — ASPIRIN 325 MG PO TABS
325.0000 mg | ORAL_TABLET | Freq: Every day | ORAL | Status: DC
  Administered 2013-05-13 – 2013-05-22 (×10): 325 mg via ORAL
  Filled 2013-05-12 (×12): qty 1

## 2013-05-12 MED ORDER — ALPRAZOLAM 0.25 MG PO TABS
0.2500 mg | ORAL_TABLET | Freq: Two times a day (BID) | ORAL | Status: DC
  Administered 2013-05-12 – 2013-05-15 (×6): 0.25 mg via ORAL
  Filled 2013-05-12 (×6): qty 1

## 2013-05-12 MED ORDER — ENSURE COMPLETE PO LIQD
237.0000 mL | Freq: Two times a day (BID) | ORAL | Status: DC
  Administered 2013-05-14 – 2013-05-21 (×9): 237 mL via ORAL

## 2013-05-12 MED ORDER — ONDANSETRON HCL 4 MG/2ML IJ SOLN: 4.0000 mg | Freq: Four times a day (QID) | INTRAMUSCULAR | Status: DC | PRN
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
## 2013-05-12 NOTE — H&P (Signed)
Physical Medicine and Rehabilitation Admission H&P  Chief Complaint   Patient presents with   .  Code Stroke   :  Chief complaint: Weakness  HPI: Isaac Johnson is a [name] with history of CHF, stomach ulcers, sciatica LLE, brain lipoma; who was admitted to [facility] on 05/06/13 with right side weakness. He was treated with tPA and transferred to [facility] for further treatment. CT head negative for acute changes. 2D echo with EF 25% with diffuse hypokinesis and grade 2 diastolic dysfunction. Carotid dopplers without significant ICA stenosis. Patient with improvement after tPA . 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 Estanislado Pandy. He was evaluated by Dr. Aundra Dubin who who recommends heart cath prior to discharge to rule out CAD as cause of CM and expressed concerns that CVA could be cardioembolic due to low EF. Medications adjusted and . He was started on ASA and plavix for secondary stroke prevention given stent in place. He has had high levels of anxiety with SOB (hyperventilating?) requiring ativan. CTA of chest negative for PE but with evidence of nodal prominence of questionable etiology. Maintained on dysphagia 2 and liquid diet. Therapy evaluations completed and CIR recommended by rehab team. Patient was admitted for comprehensive rehabilitation program   Speech is dysarthric no pain complaints wants to sit up in a chair  ROS Review of Systems  HENT: Negative for hearing loss.  Eyes: Negative for blurred vision and double vision.  Respiratory: Positive for shortness of breath (with activity).   Cardiovascular: Negative for chest pain and palpitations.  Gastrointestinal: Positive for heartburn. Negative for nausea and vomiting.  Genitourinary: Negative for urgency and frequency.  Musculoskeletal:  LLE pain with occasional instability.  Neurological: Positive for sensory change, speech change, focal weakness and weakness. Negative for headaches.  Psychiatric/Behavioral: Positive for depression. The patient is nervous/anxious  Remaining review of systems negative  Past Medical History   Diagnosis  Date   .  Hypertension    .  Stomach ulcer    .  Gait instability    .  Proteinuria    .  GERD (gastroesophageal reflux disease)    .  Insomnia    .  Dizziness    .  Leukopenia    .  Sciatica      left   .  Brain lipoma  2010     Dr. Trenton Gammon   .  CHF (congestive heart failure)     Past Surgical History   Procedure  Laterality  Date   .  Radiology with anesthesia  N/A  05/07/2013     Procedure: RADIOLOGY WITH ANESTHESIA; Surgeon: Rob Hickman, MD; Location: [place]; Service: Radiology; Laterality: N/A;    Family History   Problem  Relation  Age of Onset   .  Stroke  Mother  75   .  Gout  Father     Social History: reports that he has never smoked. He does not have any smokeless tobacco history on file. He reports that he does not drink alcohol or use illicit drugs.  Allergies: No Known Allergies  Medications Prior to Admission   Medication  Sig  Dispense  Refill   .  carvedilol (COREG) 3.125 MG tablet  Take 3.125 mg by mouth 2 (two) times daily with a meal.     .  furosemide (LASIX) 20 MG tablet  Take 20 mg by mouth daily.     [name]  ibuprofen (ADVIL,MOTRIN) 200 MG tablet  Take 200 mg by mouth daily as needed (pain).     [name]  losartan (COZAAR) 50 MG tablet  Take 50 mg by mouth daily.     [name]  omeprazole (PRILOSEC) 20 MG capsule  Take 20 mg by mouth daily.      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  Functional Status:  Mobility:      ADL:  ADL  Grooming Details (indicate cue type and reason): using Lt. UE (is Rt. hand dominant)  Cognition:  Cognition  Overall Cognitive Status: Impaired/Different from baseline  Arousal/Alertness: Awake/alert  Orientation Level: Oriented X4  Attention: Alternating  Alternating Attention: Impaired  Alternating Attention Impairment: Verbal complex;Functional basic;Functional complex  Memory: Impaired  Memory Impairment: Decreased recall of new information  Awareness: Appears intact  Problem Solving: Impaired  Problem Solving Impairment: Verbal complex;Functional complex  Executive Function: Advertising account executive: Impaired  Organizing Impairment: Verbal complex  Safety/Judgment: Appears intact  Cognition  Arousal/Alertness: Awake/alert  Behavior During Therapy: Flat affect  Overall Cognitive Status: Impaired/Different from baseline  Area of Impairment: Problem solving  Current Attention Level: Selective  Problem Solving: Slow processing  General Comments: Patient has difficulty sequencing tasks.  Physical Exam:  Blood pressure 122/85, pulse 79, temperature 98.7 F (37.1 C), temperature source Oral, resp. rate 18, height 6' (1.829 m), weight 170 lb 6.7 oz (77.3 kg), SpO2 100.00%.  Physical Exam  Constitutional: He is oriented to person, place, and time. He appears well-developed and well-nourished.  HENT:  Head: Normocephalic and atraumatic.  Poor  dentition.  Eyes: Conjunctivae are normal. Pupils are equal, round, and reactive to light.  Neck: Normal range of motion. Neck supple.  Cardiovascular: Normal rate and regular rhythm.  No murmur heard.  Respiratory: Effort normal and breath sounds normal. No respiratory distress. He has no wheezes.  GI: Soft. Bowel sounds are normal. He exhibits no distension. There is no tenderness.  Musculoskeletal: He exhibits no edema and no tenderness.  Neurological: He is alert and oriented to person, place, and time.  Poor awareness of posture with tendency to slump to the left. Speech soft but clear. Ocasionally hyperventilating due to anxiety. Able to follow basic commands without difficulty. Right hemiparesis UE>LE. Mild sensory deficits on right side. Pt with right central 7 and tongue deviation. Speech dysarthric. Sensation intact to light touch in the upper and lower limbs. motor strength: RUE with trace pecs but otherwise 1+/5 RUE. RLE 2- at HF,KE and KE/KF, 0/5 at ankle. DTR's 1+. No resting tone.  Skin: Skin is warm and dry.  Psychiatric:  Flat, ?depressed  Results for orders placed during the hospital encounter of 05/05/13 (from the past  11 hour(s))   HIV ANTIBODY (ROUTINE TESTING) Status: None    Collection Time    05/10/13 11:10 AM   Result  Value  Ref Range    HIV 1&2 Ab, 4th Generation  NON REACTIVE  NON REACTIVE    Comment:  (NOTE)     A NON-REACTIVE HIV Ag/Ab result does not exclude HIV infection since     the time frame for seroconversion is variable. If acute HIV infection     is suspected, a HIV-1 RNA Qualitative TMA test is recommended.     HIV-1/2 Antibody Diff Not indicated.     HIV-1 RNA, Qual TMA Not indicated.     PLEASE NOTE: This information has been disclosed to you from records     whose confidentiality may be protected by state law. If your state     requires such protection, then the state law prohibits you from making     any further disclosure of the information  without the specific written     consent of the person to whom it pertains, or as otherwise permitted     by law. A general authorization for the release of medical or other     information is NOT sufficient for this purpose.     The performance of this assay has not been clinically validated in     patients less than 43 years old.     Performed at [facility]    No results found.  Post Admission Physician Evaluation:  1. Functional deficits secondary to right hemiparesis secondary to left MCA distribution infarcts. 2. Patient is admitted to receive collaborative, interdisciplinary care between the physiatrist, rehab nursing staff, and therapy team. 3. Patient's level of medical complexity and substantial therapy needs in context of that medical necessity cannot be provided at a lesser intensity of care such as a SNF. 4. Patient has experienced substantial functional loss from his/her baseline which was documented above under the "Functional History" and "Functional Status" headings. Judging by the patient's diagnosis, physical exam, and functional history, the patient has potential for functional progress which will result in measurable gains while on inpatient rehab. These gains will be of substantial and practical use upon discharge in facilitating mobility and self-care at the household level. 5. Physiatrist will provide 24 hour management of medical needs as well as oversight of the therapy plan/treatment and provide guidance as appropriate regarding the interaction of the two. 6. 24 hour rehab nursing will assist with bladder management, bowel management, safety, skin/wound care, disease management, medication administration, pain management and patient education and help integrate therapy concepts, techniques,education, etc. 7. PT will assess and treat for/with: pre gait, gait training, endurance , safety, equipment, neuromuscular re education. Goals are: Min A Mobility. 8. OT will  assess and treat for/with: ADLs, Cognitive perceptual skills, Neuromuscular re education, safety, endurance, equipment. Goals are: Min A ADLs. 9. SLP will assess and treat for/with: speech, swallow. Goals are: 100% speech intelligibility, safe po intake. 10. Case Management and Social Worker will assess and treat for psychological issues and discharge planning. 11. Team conference will be held weekly to assess progress toward goals and to determine barriers to discharge. 12. Patient will receive at least 3 hours of therapy per day at least 5 days per week. 13. ELOS: 20-24 d 14. Prognosis: good Medical Problem List and Plan:  1. 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 Status post revascularization with clot retrieval  2. DVT  Prophylaxis/Anticoagulation: SCDs. Monitor for any signs of DVT  3. Pain Management: Tylenol as needed  4. Mood/anxiety: Xanax 0.25 mg twice a day, Ativan 1 mg every 6 as as needed anxiety. Bed alarm for safety  5. Neuropsych: This patient is  capable of making decisions on his own behalf.  6. Dysphagia. Dysphagia 2 thin liquids. Followup speech therapy  7.Hypertension. Cozaar 25 mg daily, Lasix 20 mg daily, Aldactone 12.5 mg daily. Monitor with increased mobility  8. Cardiomyopathy/CHF. Monitor for any signs of fluid overload. Followup cardiology services. Plan left heart catheterization as outpatient per cardiology Dr. Dannette Barbara M.D. [facility] FAAPM&R (Sports Med, Neuromuscular Med) Diplomate Am Board of Electrodiagnostic Med   05/12/2013
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
## 2013-05-12 NOTE — Progress Notes (Signed)
I have insurance approval and will make arrangements to admit pt to inpt rehab today. He is in agreement. I will contact his wife by phone. RN CM and SW are aware. [phone]

## 2013-05-12 NOTE — Progress Notes (Addendum)
I await insurance decision for admission to inpt rehab. Pt is aware. [phone]

## 2013-05-13 ENCOUNTER — Inpatient Hospital Stay (HOSPITAL_COMMUNITY): Payer: BC Managed Care – PPO

## 2013-05-13 ENCOUNTER — Inpatient Hospital Stay (HOSPITAL_COMMUNITY): Payer: BC Managed Care – PPO | Admitting: Physical Therapy

## 2013-05-13 ENCOUNTER — Inpatient Hospital Stay (HOSPITAL_COMMUNITY): Payer: BC Managed Care – PPO | Admitting: Occupational Therapy

## 2013-05-13 LAB — COMPREHENSIVE METABOLIC PANEL
ALBUMIN: 2.8 g/dL — AB (ref 3.5–5.2)
ALT: 22 U/L (ref 0–53)
AST: 20 U/L (ref 0–37)
Alkaline Phosphatase: 37 U/L — ABNORMAL LOW (ref 39–117)
BUN: 11 mg/dL (ref 6–23)
CALCIUM: 8.4 mg/dL (ref 8.4–10.5)
CO2: 23 meq/L (ref 19–32)
CREATININE: 1.31 mg/dL (ref 0.50–1.35)
Chloride: 106 mEq/L (ref 96–112)
GFR calc Af Amer: 72 mL/min — ABNORMAL LOW (ref >90)
GFR, EST NON AFRICAN AMERICAN: 62 mL/min — AB (ref >90)
Glucose, Bld: 85 mg/dL (ref 70–99)
Potassium: 3.5 mEq/L — ABNORMAL LOW (ref 3.7–5.3)
Sodium: 143 mEq/L (ref 137–147)
Total Bilirubin: 1 mg/dL (ref 0.3–1.2)
Total Protein: 5.9 g/dL — ABNORMAL LOW (ref 6.0–8.3)

## 2013-05-13 LAB — CBC WITH DIFFERENTIAL/PLATELET
BASOS ABS: 0 10*3/uL (ref 0.0–0.1)
BASOS PCT: 1 % (ref 0–1)
EOS PCT: 2 % (ref 0–5)
Eosinophils Absolute: 0.1 10*3/uL (ref 0.0–0.7)
HCT: 37.5 % — ABNORMAL LOW (ref 39.0–52.0)
Hemoglobin: 12.9 g/dL — ABNORMAL LOW (ref 13.0–17.0)
Lymphocytes Relative: 30 % (ref 12–46)
Lymphs Abs: 1.3 10*3/uL (ref 0.7–4.0)
MCH: 31.9 pg (ref 26.0–34.0)
MCHC: 34.4 g/dL (ref 30.0–36.0)
MCV: 92.6 fL (ref 78.0–100.0)
MONO ABS: 0.4 10*3/uL (ref 0.1–1.0)
Monocytes Relative: 9 % (ref 3–12)
Neutro Abs: 2.5 10*3/uL (ref 1.7–7.7)
Neutrophils Relative %: 58 % (ref 43–77)
Platelets: 186 10*3/uL (ref 150–400)
RBC: 4.05 MIL/uL — ABNORMAL LOW (ref 4.22–5.81)
RDW: 13.4 % (ref 11.5–15.5)
WBC: 4.3 10*3/uL (ref 4.0–10.5)

## 2013-05-13 NOTE — Progress Notes (Signed)
Subjective/Complaints: [name] with history of CHF, stomach ulcers, sciatica LLE, brain lipoma; who was admitted to [facility] on 05/06/13 with right side weakness. He was treated with tPA and transferred to [facility] for further treatment. CT head negative for acute changes. 2D echo with EF 25% with diffuse hypokinesis and grade 2 diastolic dysfunction. Carotid dopplers without significant ICA stenosis. Patient with improvement after tPA . 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 Deveshwar. He was evaluated by Dr. McLean who who recommends heart cath prior to discharge to rule out CAD as cause of CM and expressed concerns that CVA could be cardioembolic due to low EF. Medications adjusted and . He was started on ASA and plavix for secondary stroke prevention given stent in place. He has had high levels of anxiety with SOB (hyperventilating?) requiring ativan. CTA of chest negative for PE but with evidence of nodal prominence of questionable etiology.  C/o coughing last noc, no SOB no fevers Review of Systems - Negative except Right side weak  Objective: Vital Signs: Blood pressure 150/82, pulse 85, temperature 97.7 F (36.5 C), temperature source Oral, resp. rate 18, height 6' (1.829 m), SpO2 100.00%. No results found. Results for orders placed during the hospital encounter of 05/12/13 (from the past 72 hour(s))  CBC WITH DIFFERENTIAL     Status: Abnormal   Collection Time    05/13/13  6:25 AM      Result Value Ref Range   WBC 4.3  4.0 - 10.5 K/uL   RBC 4.05 (*) 4.22 - 5.81 MIL/uL   Hemoglobin  12.9 (*) 13.0 - 17.0 g/dL   HCT 37.5 (*) 39.0 - 52.0 %   MCV 92.6  78.0 - 100.0 fL   MCH 31.9  26.0 - 34.0 pg   MCHC 34.4  30.0 - 36.0 g/dL   RDW 13.4  11.5 - 15.5 %   Platelets 186  150 - 400 K/uL   Neutrophils Relative % 58  43 - 77 %   Neutro Abs 2.5  1.7 - 7.7 K/uL   Lymphocytes Relative 30  12 - 46 %   Lymphs Abs 1.3  0.7 - 4.0 K/uL   Monocytes Relative 9  3 - 12 %   Monocytes Absolute 0.4  0.1 - 1.0 K/uL   Eosinophils Relative 2  0 - 5 %   Eosinophils Absolute 0.1  0.0 - 0.7 K/uL   Basophils Relative 1  0 - 1 %   Basophils Absolute 0.0  0.0 - 0.1 K/uL  COMPREHENSIVE METABOLIC PANEL     Status: Abnormal   Collection Time    05/13/13  6:25 AM      Result Value Ref Range   Sodium 143  137 - 147 mEq/L   Potassium 3.5 (*) 3.7 - 5.3 mEq/L   Chloride 106  96 - 112 mEq/L   CO2 23  19 - 32 mEq/L   Glucose, Bld 85  70 - 99 mg/dL   BUN 11  6 - 23 mg/dL   Creatinine, Ser 1.31  0.50 - 1.35 mg/dL   Calcium 8.4  8.4 - 10.5 mg/dL     Total Protein 5.9 (*) 6.0 - 8.3 g/dL   Albumin 2.8 (*) 3.5 - 5.2 g/dL   AST 20  0 - 37 U/L   ALT 22  0 - 53 U/L   Alkaline Phosphatase 37 (*) 39 - 117 U/L   Total Bilirubin 1.0  0.3 - 1.2 mg/dL   GFR calc non Af Amer 62 (*) >90 mL/min   GFR calc Af Amer 72 (*) >90 mL/min   Comment: (NOTE)     The eGFR has been calculated using the CKD EPI equation.     This calculation has not been validated in all clinical situations.     eGFR's persistently <90 mL/min signify possible Chronic Kidney     Disease.     HEENT: normal Cardio: RRR and no murmur Resp: CTA B/L and unlabored GI: BS positive and NT.ND Extremity:  Pulses positive and No Edema Skin:   Intact Musc/Skel:  Normal Right hemiparesis UE>LE. Mild sensory deficits on right side. Pt with right central 7 and tongue deviation. Speech dysarthric. Sensation intact to light touch in the upper and lower limbs. motor strength: RUE with trace pecs but otherwise 1+/5 RUE. RLE 2- at HF,KE and KE/KF, 0/5 at  ankle   Assessment/Plan: 1. Functional deficits secondary to L MCA distribution infarct which require 3+ hours per day of interdisciplinary therapy in a comprehensive inpatient rehab setting. Physiatrist is providing close team supervision and 24 hour management of active medical problems listed below. Physiatrist and rehab team continue to assess barriers to discharge/monitor patient progress toward functional and medical goals. FIM:                   Comprehension Comprehension Mode: Auditory Comprehension: 5-Follows basic conversation/direction: With extra time/assistive device  Expression Expression Mode: Verbal Expression: 5-Expresses basic needs/ideas: With extra time/assistive device  Social Interaction Social Interaction: 5-Interacts appropriately 90% of the time - Needs monitoring or encouragement for participation or interaction.        Medical Problem List and Plan:  1. 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 Status post revascularization with clot retrieval  2. DVT Prophylaxis/Anticoagulation: SCDs. Monitor for any signs of DVT  3. Pain Management: Tylenol as needed  4. Mood/anxiety: Xanax 0.25 mg twice a day, Ativan 1 mg every 6 as as needed anxiety. Bed alarm for safety  5. Neuropsych: This patient is capable of making decisions on his own behalf.  6. Dysphagia. Dysphagia 2 thin liquids. Followup speech therapy  7.Hypertension. Cozaar 25 mg daily, Lasix 20 mg daily, Aldactone 12.5 mg daily. Monitor with increased mobility  8. Cardiomyopathy/CHF. Monitor for any signs of fluid overload. Followup cardiology services. Plan left heart catheterization as outpatient per cardiology Dr. Aundra Dubin    LOS (Days) 1 A FACE TO FACE EVALUATION WAS PERFORMED  Charlett Blake 05/13/2013, 8:10 AM

## 2013-05-13 NOTE — Evaluation (Signed)
Speech Language Pathology Assessment and Plan  Patient Details  Name: Isaac Johnson MRN: [id number] Date of Birth: 1965/07/24  SLP Diagnosis: Cognitive Impairments;Dysarthria  Rehab Potential: Good ELOS:   2 weeks  Today's Date: 05/13/2013 Time: 11:00-11:55 Time Calculation (min): 55 min  Problem List:  Patient Active Problem List   Diagnosis Date Noted  . 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 Anxiety state, unspecified 05/12/2013  . CVA (cerebral infarction) 05/12/2013  . Systolic dysfunction -EF 14% by 2D echo 05/2013 05/11/2013  . 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 LEUKOPENIA, MILD 10/03/2008  . OTHER ABNORMAL BLOOD CHEMISTRY 10/03/2008  . PROTEINURIA 08/20/2006  . HYPERTENSION 07/23/2006  . GERD 07/23/2006   Past Medical History:  Past Medical History  Diagnosis Date  . Hypertension   . Stomach ulcer   . Gait instability   . Proteinuria   . GERD (gastroesophageal reflux disease)   . Insomnia   . Dizziness   . Leukopenia   . Sciatica     left  . Brain lipoma 2010    Dr. Trenton Gammon  . CHF (congestive heart failure)    Past Surgical History:  Past Surgical History  Procedure Laterality Date  . Radiology with anesthesia N/A 05/07/2013    Procedure: RADIOLOGY WITH ANESTHESIA;  Surgeon: Rob Hickman, MD;  Location: [place];  Service: Radiology;  Laterality: N/A;    Assessment / Plan / Recommendation Clinical Impression 48 y.o. male with history of CHF, stomach ulcers, sciatica LLE, brain lipoma; who was admitted with right side weakness. He was treated with tPA and transferred to [facility] for further treatment. CT head negative for acute changes. On 05/07/13 he developed recurrent dense right sided weakness and follow up MRI of brain revealed scattered acute ischemic infarcts involving the left basal ganglia and other left MCA territories with complete occlusion of proximal L-MI segment and partial agenesis of corpus callosum with  midline lipoma.He underwent cerebral angio with revascularization and clot retrieval of occluded L-MCA. He was evaluated by Dr. Aundra Dubin who who recommends heart cath prior to discharge. He has had high levels of anxiety with SOB (hyperventilating?) requiring ativan. CTA of chest negative for PE but with evidence of nodal prominence of questionable etiology.  BSE on acute recommended Dys 3 and thin liquids, however follow up pt. NPO s/p MCA revascularization with clot retrieval and stent placement in IR on 05/07/13 and swallow reassessed with recommendations of Dys 2 and thin.  Swallow assessment on CIR reveals mild-moderate oropharyngeal dysphagia with mildly prolonged mastication and transit and intermittent delayed cough following thin throughout a 30 minute period.  SLP recommended, with pt. agreement to a Dys 3 texture upgrade and thin liquids with precautions.  He exhbits moderate dysarthria marked by decreased vocal intensity and reduced labial/lingual ROM resulting in phonemic distortions.  Basic cognition appeared functional, however he demonstrated decreased processing particularly as length and complexity of information increased.  Problem solving decreased for higher level information and reduced recall of complex and unfamiliar information.  Pt. would benefit from ST to improve swallow function, speech intelligibility and cognitive independence.   Skilled Therapeutic Interventions            SLP Assessment  Patient will need skilled Speech Lanaguage Pathology Services during CIR admission    Recommendations  Diet Recommendations: Dysphagia 2 (Fine chop);Thin liquid Liquid Administration via: Spoon;No straw Medication Administration: Whole meds with puree Supervision: Patient able to self feed;Intermittent supervision to cue for compensatory strategies Compensations:  Slow rate;Check for pocketing;Small sips/bites Postural Changes and/or Swallow Maneuvers: Seated upright 90 degrees Oral Care  Recommendations: Oral care BID Patient destination: Home Follow up Recommendations: Other (comment) Equipment Recommended: None recommended by SLP    SLP Frequency   1-2/day 45 min  SLP Treatment/Interventions Cognitive remediation/compensation;Patient/family education;Functional tasks;Dysphagia/aspiration precaution training    Pain                                                                   No  Complaints pain   Prior Functioning    Short Term Goals: Week 1: SLP Short Term Goal 1 (Week 1): Pt. will demonstrate appropriate problem solving abilities during complex activities wtih moderate verbal cues SLP Short Term Goal 2 (Week 1): Pt. will recall and utilize speech strategies in conversation with moderate verbal cues. SLP Short Term Goal 3 (Week 1): Pt. will utilize memory strategies to recall important information with moderate verbal cues  See FIM for current functional status Refer to Care Plan for Long Term Goals  Recommendations for other services: None  Discharge Criteria: Patient will be discharged from SLP if patient refuses treatment 3 consecutive times without medical reason, if treatment goals not met, if there is a change in medical status, if patient makes no progress towards goals or if patient is discharged from hospital.  The above assessment, treatment plan, treatment alternatives and goals were discussed and mutually agreed upon: by patient  Houston Siren 05/13/2013, 4:29 PM

## 2013-05-13 NOTE — Progress Notes (Signed)
Patient information reviewed and entered into eRehab system by Myeisha Kruser, RN, CRRN, PPS Coordinator.  Information including medical coding and functional independence measure will be reviewed and updated through discharge.    

## 2013-05-13 NOTE — Evaluation (Signed)
Physical Therapy Assessment and Plan  Patient Details  Name: Isaac Johnson MRN: [id number] Date of Birth: 10-Jul-1965  PT Diagnosis: Abnormal posture, Abnormality of gait, Cognitive deficits, Hemiplegia dominant, Impaired sensation and Muscle weakness Rehab Potential: Good ELOS: 17-21 days   Today's Date: 05/13/2013 Time: 0900-1000 Time Calculation (min): 60 min  Problem List:  Patient Active Problem List   Diagnosis Date Noted  . 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 Anxiety state, unspecified 05/12/2013  . CVA (cerebral infarction) 05/12/2013  . Systolic dysfunction -EF 68% by 2D echo 05/2013 05/11/2013  . 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 LEUKOPENIA, MILD 10/03/2008  . OTHER ABNORMAL BLOOD CHEMISTRY 10/03/2008  . PROTEINURIA 08/20/2006  . HYPERTENSION 07/23/2006  . GERD 07/23/2006    Past Medical History:  Past Medical History  Diagnosis Date  . Hypertension   . Stomach ulcer   . Gait instability   . Proteinuria   . GERD (gastroesophageal reflux disease)   . Insomnia   . Dizziness   . Leukopenia   . Sciatica     left  . Brain lipoma 2010    Dr. Trenton Gammon  . CHF (congestive heart failure)    Past Surgical History:  Past Surgical History  Procedure Laterality Date  . Radiology with anesthesia N/A 05/07/2013    Procedure: RADIOLOGY WITH ANESTHESIA;  Surgeon: Rob Hickman, MD;  Location: [facility];  Service: Radiology;  Laterality: N/A;    Assessment & Plan Clinical Impression: [name] is a [name] with history of CHF, stomach ulcers, sciatica LLE, brain lipoma; who was admitted to [facility] on 05/06/13 with right side weakness. He was treated with tPA and transferred to [facility] for further treatment. CT head negative for acute changes. 2D echo with EF 25% with diffuse hypokinesis and grade 2 diastolic dysfunction. Carotid dopplers without significant ICA stenosis. Patient with improvement after tPA . 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 Estanislado Pandy. He was evaluated by Dr. Aundra Dubin who who recommends heart cath prior to discharge to rule out CAD as cause of CM and expressed concerns that CVA could be cardioembolic due to low EF. Medications adjusted and . He was started on ASA and plavix for secondary stroke prevention given stent in place. He has had high levels of anxiety with SOB (hyperventilating?) requiring ativan. CTA of chest negative for PE but with evidence of nodal prominence of questionable etiology. Maintained on dysphagia 2 and liquid diet. Patient transferred to CIR on 05/12/2013 .   Patient currently requires mod-max with mobility secondary to muscle weakness, decreased cardiorespiratoy endurance, impaired timing and sequencing, unbalanced muscle activation and decreased motor planning, decreased visual perceptual skills, decreased midline orientation and decreased attention to right, decreased problem solving and decreased memory and decreased sitting balance, decreased standing balance, decreased postural control, hemiplegia and decreased balance strategies.  Prior to hospitalization, patient was independent  with mobility and lived with Spouse;Daughter in a House home.  Home access is 3Stairs to enter.  Patient will benefit from skilled PT intervention to maximize safe functional mobility and minimize fall risk for planned discharge home  with 24 hour supervision.  Anticipate patient will home health vs outpatient PT (TBD) at discharge.  PT - End of Session Activity Tolerance: Tolerates 30+ min activity with  multiple rests Endurance Deficit: Yes PT Assessment Rehab Potential: Good Barriers to Discharge: Decreased caregiver support Barriers to Discharge Comments: Wife works 2nd shift PT Patient demonstrates impairments in the following area(s): Balance;Endurance;Motor;Perception;Safety;Sensory PT Transfers Functional Problem(s): Bed Mobility;Car;Bed to Chair;Furniture;Floor PT Locomotion Functional Problem(s): Ambulation;Wheelchair Mobility;Stairs PT Plan PT Intensity: Minimum of 1-2 x/day ,45 to 90 minutes PT Frequency: 5 out of 7 days PT Duration Estimated Length of Stay: 17-21 days PT Treatment/Interventions: Ambulation/gait training;Balance/vestibular training;Cognitive remediation/compensation;Disease management/prevention;Discharge planning;DME/adaptive equipment instruction;Functional electrical stimulation;Functional mobility training;Patient/family education;Neuromuscular re-education;Splinting/orthotics;Therapeutic Activities;Stair training;UE/LE Strength taining/ROM;Wheelchair propulsion/positioning;UE/LE Coordination activities;Visual/perceptual remediation/compensation PT Transfers Anticipated Outcome(s): Supervision PT Locomotion Anticipated Outcome(s): Supervision-Min A PT Recommendation Recommendations for Other Services: Neuropsych consult Follow Up Recommendations: Home health PT;Outpatient PT;24 hour supervision/assistance Patient destination: Home Equipment Recommended: To be determined Equipment Details: Pt does not own personal assistive devices.  Skilled Therapeutic Intervention PT evaluation completed. See below for detailed description of findings. Treatment initiated. Session focused on introduction of w/c mobility, w/c parts management as well as increasing stability/independence with gait and stair negotiation. See below for detailed description of gait deviations and assist/cueing required with gait and stairs. Session ended in pt room, where pt was lefts seated in w/c  with quick release belt on for safety and all needs within reach.   PT Evaluation Precautions/Restrictions Precautions Precautions: Fall Precaution Comments: Pusher tendencies, Right hemi, Right inattention Restrictions Weight Bearing Restrictions: No General   Vital SignsTherapy Vitals Temp: 98 F (36.7 C) Temp src: Oral Pulse Rate: 81 Resp: 18 BP: 118/78 mmHg Patient Position, if appropriate: Sitting Oxygen Therapy SpO2: 100 % O2 Device: None (Room air) Pain Pain Assessment Pain Assessment: No/denies pain Pain Score: 0-No pain Home Living/Prior Functioning Home Living Available Help at Discharge: Available PRN/intermittently;Family (wife works & is looking for 24/7 care) Type of Home: House Home Access: Stairs to enter Technical brewer of Steps: 3 Entrance Stairs-Rails: None Home Layout: One level Additional Comments: Patient has tub/shower combo with curtain. Pt owns no personal assistive devices.  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   Vision/Perception    During w/c mobility, Inattention to R side of body and to R visual field noted. Cognition Overall Cognitive Status: Impaired/Different from baseline Arousal/Alertness: Awake/alert Orientation Level: Oriented X4 Attention: Sustained Sustained Attention: Appears intact Memory: Impaired Memory Impairment: Retrieval deficit;Decreased recall of new information;Prospective memory Awareness: Appears intact Problem Solving: Impaired Problem Solving Impairment: Verbal complex;Functional complex Executive Function: Self  Correcting Self Correcting: Impaired Self Correcting Impairment: Verbal basic Safety/Judgment: Appears intact Sensation Sensation Light Touch: Impaired by gross assessment Stereognosis: Not tested Hot/Cold: Not tested Proprioception: Impaired by gross assessment Additional Comments: Light touch diminished on LLE as compared with RLE. Proprioception impaired in LLE. Coordination Gross Motor Movements are Fluid and Coordinated: No Fine Motor Movements are Fluid and Coordinated: Not tested (Not formally assessed secondary to R hemiplegia) Heel Shin Test: Not fromally assessed secondary to R hemiplegia Motor  Motor Motor: Abnormal postural alignment and control;Hemiplegia Motor - Skilled Clinical Observations: R hemiplegia  Mobility Bed Mobility Bed  Mobility: Supine to Sit Supine to Sit: 4: Min assist;HOB flat Transfers Transfers: Yes Sit to Stand: 3: Mod assist;From chair/3-in-1;With armrests Sit to Stand Details: Manual facilitation for weight shifting;Tactile cues for weight beaing;Verbal cues for technique Stand to Sit: 3: Mod assist;To chair/3-in-1;With armrests Stand to Sit Details (indicate cue type and reason): Manual facilitation for weight shifting;Tactile cues for weight beaing;Verbal cues for precautions/safety Squat Pivot Transfers: 2: Max Risk manager Details: Verbal cues for technique;Manual facilitation for weight shifting Locomotion  Ambulation Ambulation: Yes Ambulation/Gait Assistance: 1: +2 Total assist Ambulation Distance (Feet): 26 Feet Assistive device: Other (Comment) (L hallway hand rail) Ambulation/Gait Assistance Details: Manual facilitation for weight shifting;Tactile cues for posture;Manual facilitation for placement;Verbal cues for gait pattern Ambulation/Gait Assistance Details: One therapist positioned under pt's RUE proving max-total A for stability, manual facilitation of bilat weight shifting. Second PT providing w/c follow, manual  facilitation at posterior hips for bilat hip extension. Verbal cueing focused on upright posture and increased bilat LE step length. Gait Gait: Yes Gait Pattern: Step-to pattern;Decreased step length - left;Decreased stance time - right;Decreased dorsiflexion - right;Decreased weight shift to right;Decreased weight shift to left;Right flexed knee in stance;Narrow base of support;Trunk flexed;Trunk rotated posteriorly on right;Lateral trunk lean to right Stairs / Additional Locomotion Stairs: Yes Stairs Assistance: 1: +2 Total assist Stairs Assistance Details: Tactile cues for sequencing;Manual facilitation for weight shifting;Verbal cues for technique;Verbal cues for sequencing;Tactile cues for posture;Manual facilitation for placement Stairs Assistance Details (indicate cue type and reason): One therapist positioned under RUE providing manual facilitation of bilat weight shifting, tactile cueing to initiate RLE step, multimodal cueing for R knee extension prior to initiation of LLE step. Verbal cueing focused on technique, sequencing, and upright posture. Stair Management Technique: One rail Left;Step to pattern;Forwards Number of Stairs: 5 Architect: Yes Wheelchair Assistance: 2: Max Technical sales engineer Details: Manual facilitation for placement;Tactile cues for sequencing;Verbal cues for technique;Tactile cues for weight beaing;Verbal cues for sequencing Wheelchair Propulsion: Left upper extremity;Left lower extremity Wheelchair Parts Management: Needs assistance Distance: 40  Trunk/Postural Assessment  Cervical Assessment Cervical Assessment: Within Functional Limits Thoracic Assessment Thoracic Assessment: Within Functional Limits Lumbar Assessment Lumbar Assessment: Within Functional Limits Postural Control Postural Control: Deficits on evaluation Trunk Control: When seated without UE support, pt exhibits tendency toward LOB to R side. Postural  Limitations: Pt pushes trunk to R side with sitting, standing, and gait. Pt able to self-correct with verbal cueing, visual feedback (mirror).  Balance Balance Balance Assessed: Yes Static Sitting Balance Static Sitting - Balance Support: Left upper extremity supported;Feet supported Static Sitting - Level of Assistance: 5: Stand by assistance;1: +1 Total assist (varied from stand by assistance to total assist) Dynamic Sitting Balance Dynamic Sitting - Balance Support: Left upper extremity supported Dynamic Sitting - Level of Assistance: 4: Min assist;1: +1 Total assist (varied from min assist -total assist) Dynamic Sitting - Balance Activities: Reaching across midline;Trunk control activities;Lateral lean/weight shifting;Forward lean/weight shifting;Reaching for objects Sitting balance - Comments: During BADL tasks sitting unsupported EOB Static Standing Balance Static Standing - Balance Support: Left upper extremity supported;During functional activity Static Standing - Level of Assistance: 4: Min assist;1: +1 Total assist (varied from Min assist - total assist) Dynamic Standing Balance Dynamic Standing - Balance Support: Left upper extremity supported;During functional activity Dynamic Standing - Level of Assistance: 3: Mod assist;1: +1 Total assist Dynamic Standing - Balance Activities: Lateral lean/weight shifting;Reaching for objects;Reaching across midline (during BADL tasks standing at sink) Extremity Assessment  RUE AROM (degrees) Overall AROM Right Upper Extremity: Deficits (Motor return beginning to occur in all joints.) RUE PROM (degrees) RUE Overall PROM Comments: WFL RUE Tone RUE Tone: Hypotonic Hypotonic Details: Appears flaccid during mobility and BADL yet is able to initiate movement in all joints with increased effort. LUE Assessment LUE Assessment: Within Functional Limits RLE Assessment RLE Assessment: Exceptions to [facility] RLE Strength Right Hip Flexion: 3-/5 Right  Knee Flexion: 3-/5 Right Knee Extension: 3-/5 Right Ankle Dorsiflexion: 0/5 Right Ankle Plantar Flexion: 1/5 LLE Assessment LLE Assessment: Within Functional Limits  FIM:  FIM - Control and instrumentation engineer Devices: Arm rests Bed/Chair Transfer: 4: Supine > Sit: Min A (steadying Pt. > 75%/lift 1 leg);2: Bed > Chair or W/C: Max A (lift and lower assist) FIM - Locomotion: Wheelchair Locomotion: Wheelchair: 1: Travels less than 50 ft with maximal assistance (Pt: 25 - 49%) FIM - Locomotion: Ambulation Locomotion: Ambulation Assistive Devices: Other (comment) (L hallway hand rail) Ambulation/Gait Assistance: 1: +2 Total assist Locomotion: Ambulation: 1: Two helpers FIM - Locomotion: Stairs Locomotion: Scientist, physiological: Insurance account manager - 1 Locomotion: Stairs: 1: Two helpers   Refer to [employer] for [place]  Recommendations for other services: Neuropsych  Discharge Criteria: Patient will be discharged from PT if patient refuses treatment 3 consecutive times without medical reason, if treatment goals not met, if there is a change in medical status, if patient makes no progress towards goals or if patient is discharged from hospital.  The above assessment, treatment plan, treatment alternatives and goals were discussed and mutually agreed upon: by patient  Benjie Karvonen A Zedrick Springsteen 05/13/2013, 5:38 PM

## 2013-05-13 NOTE — Progress Notes (Signed)
Occupational Therapy Session Note  Patient Details  Name: Isaac Johnson MRN: [id number] Date of Birth: 18-Jul-1965  Today's Date: 05/13/2013 Time: 0623-7628 Time Calculation (min): 24 min  Short Term Goals: Week 1:  OT Short Term Goal 1 (Week 1): Groom: Patient will perform 3/5 grooming tasks standing at sink with min assist OT Short Term Goal 2 (Week 1): Bath:  Min assist to include sit and stand. OT Short Term Goal 3 (Week 1): UB Dressing: Min assist in unsupported sitting OT Short Term Goal 4 (Week 1): LB Dressing:  Mod Assist to include sit and stand OT Short Term Goal 5 (Week 1): RUE:  Patient will position and monitor RUE at all times during BADL and functional mobility with no more than 4 vcs per session  Skilled Therapeutic Interventions/Progress Updates:    Pt seen for 1:1 OT session with focus on visual assessment, sitting balance, and functional transfers. Pt received sitting in recliner chair reporting feeling tired and wanting to go to bed. Pt agreeable to begin session sitting in recliner chair and transfer to bed at end of session. Completed visual assessment with pt using cross out A's, house drawing and clock drawing. Pt with no neglect noted however demonstrates right inattention as he required total assist throughout therapy session for placement of RUE to assist with holding paper and positioning of RLE when attempting to pull table closer to him. Completed squat pivot transfer bed>w/c to left side with max assist. Engaged in reaching activities sitting EOB with min-supervision and no pusher tendencies noted. At end of session pt completed sit>supine with max assist and therapist providing manual facilitation to promote core strength and controlled movements. Pt left supine in bed with all needs in reach and therapist providing optimal positioning of RUE.   Therapy Documentation Precautions:  Precautions Precautions: Fall Precaution Comments: Pusher tendencies, Right hemi,  Right inattention Restrictions Weight Bearing Restrictions: No General: General Amount of Missed OT Time (min): 6 Minutes Vital Signs: Therapy Vitals Temp: 98 F (36.7 C) Temp src: Oral Pulse Rate: 81 Resp: 18 BP: 118/78 mmHg Patient Position, if appropriate: Sitting Oxygen Therapy SpO2: 100 % O2 Device: None (Room air) Pain: Pain Assessment Pain Assessment: No/denies pain  See FIM for current functional status  Therapy/Group: Individual Therapy  Lonnell Chaput N Yassen Kinnett 05/13/2013, 2:32 PM

## 2013-05-13 NOTE — Evaluation (Signed)
Occupational Therapy Assessment and Plan  Patient Details  Name: Isaac Johnson MRN: [id number] Date of Birth: 01-15-1966  OT Diagnosis: abnormal posture, cognitive deficits, disturbance of vision, hemiplegia affecting dominant side and muscle weakness (generalized) Rehab Potential: Rehab Potential: Good ELOS: 17-21 days   Today's Date: 05/13/2013 Time: 0800-0900 Time Calculation (min): 60 min  Problem List:  Patient Active Problem List   Diagnosis Date Noted  . 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 Anxiety state, unspecified 05/12/2013  . CVA (cerebral infarction) 05/12/2013  . Systolic dysfunction -EF 21% by 2D echo 05/2013 05/11/2013  . 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 LEUKOPENIA, MILD 10/03/2008  . OTHER ABNORMAL BLOOD CHEMISTRY 10/03/2008  . PROTEINURIA 08/20/2006  . HYPERTENSION 07/23/2006  . GERD 07/23/2006    Past Medical History:  Past Medical History  Diagnosis Date  . Hypertension   . Stomach ulcer   . Gait instability   . Proteinuria   . GERD (gastroesophageal reflux disease)   . Insomnia   . Dizziness   . Leukopenia   . Sciatica     left  . Brain lipoma 2010    Dr. Trenton Gammon  . CHF (congestive heart failure)    Past Surgical History:  Past Surgical History  Procedure Laterality Date  . Radiology with anesthesia N/A 05/07/2013    Procedure: RADIOLOGY WITH ANESTHESIA;  Surgeon: Rob Hickman, MD;  Location: [place];  Service: Radiology;  Laterality: N/A;    Assessment & Plan Clinical Impression: Isaac Johnson is a [name] with history of CHF, stomach ulcers, sciatica LLE, brain lipoma; who was admitted to [facility] on 05/06/13 with right side weakness. He was treated with tPA and transferred to [facility] for further treatment. CT head negative for acute changes. 2D echo with EF 25% with diffuse hypokinesis and grade 2 diastolic dysfunction. Carotid dopplers without significant ICA stenosis. Patient with improvement after  tPA . 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 Estanislado Pandy. He was evaluated by Dr. Aundra Dubin who who recommends heart cath prior to discharge to rule out CAD as cause of CM and expressed concerns that CVA could be cardioembolic due to low EF. Medications adjusted and . He was started on ASA and plavix for secondary stroke prevention given stent in place. He has had high levels of anxiety with SOB (hyperventilating?) requiring ativan. CTA of chest negative for PE but with evidence of nodal prominence of questionable etiology. Maintained on dysphagia 2 and liquid diet.  Patient transferred to CIR on 05/12/2013 .    Patient currently requires max-mod assist with basic self-care skills secondary to muscle weakness, abnormal tone and unbalanced muscle activation, decreased midline orientation and decreased attention to right, decreased awareness and decreased sitting balance, decreased standing balance, decreased postural control, hemiplegia and decreased balance strategies.  Prior to hospitalization, patient could complete was completely independent, driving ans working full time.  Patient will benefit from skilled intervention to increase independence with basic self-care skills prior to discharge home with care partner and assistance from friends or private duty when wife working.  Anticipate patient will require 24 hour supervision  and follow up home health vs follow up outpatient.  OT - End of Session Activity Tolerance: Decreased this session Endurance Deficit: Yes OT Assessment Rehab Potential:  Good Barriers to Discharge: Decreased caregiver support Barriers to Discharge Comments: wife works and is trying to find someone to provide assist when she is working. OT Patient demonstrates impairments in the following area(s): Balance;Cognition;Endurance;Motor;Perception;Safety;Vision OT Basic ADL's Functional Problem(s): Eating;Grooming;Bathing;Dressing;Toileting OT Transfers Functional Problem(s): Toilet;Tub/Shower OT Additional Impairment(s): Fuctional Use of Upper Extremity OT Plan OT Intensity: Minimum of 1-2 x/day, 45 to 90 minutes OT Frequency: 5 out of 7 days OT Duration/Estimated Length of Stay: 17-21 days OT Treatment/Interventions: Medical illustrator training;Community reintegration;Discharge planning;DME/adaptive equipment instruction;Functional mobility training;Neuromuscular re-education;Psychosocial support;Patient/family education;Self Care/advanced ADL retraining;Therapeutic Activities;Therapeutic Exercise;UE/LE Strength taining/ROM;Wheelchair propulsion/positioning;Visual/perceptual remediation/compensation;UE/LE Coordination activities OT Self Feeding Anticipated Outcome(s): Supervision/set up OT Basic Self-Care Anticipated Outcome(s): Supervision/Set up OT Toileting Anticipated Outcome(s): Supervision OT Bathroom Transfers Anticipated Outcome(s): Supervision OT Recommendation Recommendations for Other Services: Neuropsych consult Patient destination: Home Follow Up Recommendations: Home health OT;Outpatient OT;24 hour supervision/assistance Equipment Recommended: Tub/shower bench Equipment Details: Continue to determine if recommend 3 in 1 commode  Skilled Therapeutic Intervention Patient eating breakfast in bed with HOB up upon arrival.  Notified RN that patient was eating breakfast unsupervised.  Informed patient that the SLP has recommended full supervision for all meals secondary to swallowing issues.  Engaged in OT evaluation and self care retraining to include  sponge bath (declined shower and concern for safety risk in shower), dress and groom.  Focused session on static and dynamic sitting and standing balance, attention to right side, increased use of RUE & RLE, bed mobility and safe bed>w/c transfers.  Patient with Pusher tendencies yet able to respond to multi modal cues to decrease pushing.  Patient noted to have minimal movement in all RUE joints yet presents as though RUE is flaccid during BADL and all functional mobility.  OT Evaluation Precautions/Restrictions  Precautions Precautions: Fall Precaution Comments: Pusher tendencies, Right hemi, Right inattention Restrictions Weight Bearing Restrictions: No Pain Pain Assessment Pain Assessment: No/denies pain Pain Score: 0-No pain Home Living/Prior Functioning Home Living Available Help at Discharge: Available PRN/intermittently;Family (wife works & is looking for 24/7 care) Type of Home: House Home Access: Stairs to enter Technical brewer of Steps: 3 Entrance Stairs-Rails: None Home Layout: One level Additional Comments: Patient has tub/shower combo with curtain  Lives With: Spouse;Daughter Prior Function Level of Independence: Independent with basic ADLs;Independent with homemaking with ambulation;Independent with homemaking with wheelchair;Independent with gait;Independent with transfers  Able to Take Stairs?: Yes Driving: Yes Vocation: Full time employment Comments: 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   ADL Overall Mod-Max Assist, refer to FIM scores below for details Vision/Perception  Vision- History Baseline Vision/History: Wears glasses Wears Glasses: At all times Patient Visual Report: No change from baseline Perception Comments: Noted right inattention during BADL Will Continue to assess  Cognition Overall Cognitive Status: Impaired/Different from baseline Arousal/Alertness: Awake/alert Orientation Level: Oriented X4 Attention: Sustained Sustained  Attention: Appears intact Memory: Impaired Memory Impairment: Retrieval deficit;Decreased recall of new information;Prospective memory Awareness: Appears intact Problem Solving: Impaired Problem Solving Impairment: Verbal complex;Functional complex Executive Function: Self Correcting Self Correcting: Impaired Self Correcting Impairment: Verbal basic Safety/Judgment: Appears intact Sensation Sensation Light Touch: Appears Intact (RUE) Stereognosis: Not tested Hot/Cold: Not tested Proprioception: Not tested Coordination Gross Motor Movements are Fluid and Coordinated: Not tested (RUE due to hemiplegia) Fine Motor Movements are Fluid and Coordinated: Not tested (RUE due to hemiplegia) Motor  Motor Motor: Abnormal postural alignment and control;Hemiplegia Motor - Skilled Clinical Observations: R hemiplegia Mobility  Bed Mobility Bed Mobility: Supine to Sit Supine to Sit: 4: Min assist;HOB flat Transfers Sit to Stand: 3: Mod assist;From chair/3-in-1;With armrests Sit to Stand Details: Manual facilitation for weight shifting;Tactile cues for weight beaing;Verbal cues for technique Stand to Sit: 3: Mod assist;To chair/3-in-1;With armrests Stand to Sit Details (indicate cue type and reason): Manual facilitation for weight shifting;Tactile cues for weight beaing;Verbal cues for precautions/safety  Trunk/Postural Assessment  Cervical Assessment Cervical Assessment: Within Functional Limits Thoracic Assessment Thoracic Assessment: Within Functional Limits Lumbar Assessment Lumbar Assessment: Within Functional Limits Postural Control Postural Control: Deficits on evaluation Trunk Control: When seated without UE support, pt exhibits tendency toward LOB to R side. Postural Limitations: Pt pushes trunk to R side with sitting, standing, and gait. Pt able to self-correct with verbal cueing, visual feedback (mirror).  Balance Balance Balance Assessed: Yes Static Sitting Balance Static  Sitting - Balance Support: Left upper extremity supported;Feet supported Static Sitting - Level of Assistance: 5: Stand by assistance;1: +1 Total assist (varied from stand by assistance to total assist) Dynamic Sitting Balance Dynamic Sitting - Balance Support: Left upper extremity supported Dynamic Sitting - Level of Assistance: 4: Min assist;1: +1 Total assist (varied from min assist -total assist) Dynamic Sitting - Balance Activities: Reaching across midline;Trunk control activities;Lateral lean/weight shifting;Forward lean/weight shifting;Reaching for objects Sitting balance - Comments: During BADL tasks sitting unsupported EOB Static Standing Balance Static Standing - Balance Support: Left upper extremity supported;During functional activity Static Standing - Level of Assistance: 4: Min assist;1: +1 Total assist (varied from Min assist - total assist) Dynamic Standing Balance Dynamic Standing - Balance Support: Left upper extremity supported;During functional activity Dynamic Standing - Level of Assistance: 3: Mod assist;1: +1 Total assist Dynamic Standing - Balance Activities: Lateral lean/weight shifting;Reaching for objects;Reaching across midline (during BADL tasks standing at sink) Extremity/Trunk Assessment RUE AROM (degrees) Overall AROM Right Upper Extremity: Deficits (Motor return beginning to occur in all joints.) RUE PROM (degrees) RUE Overall PROM Comments: WFL RUE Tone RUE Tone: Hypotonic Hypotonic Details: Appears flaccid during mobility and BADL yet is able to initiate movement in all joints with increased effort.  Scapula inferior angle winging at rest LUE Assessment LUE Assessment: Within Functional Limits  FIM:  FIM - Grooming Grooming Steps: Wash, rinse, dry face;Brush, comb hair;Oral care, brush teeth, clean dentures;Wash, rinse, dry hands Grooming: 4: Patient completes 3 of 4 or 4 of 5 steps FIM - Bathing Bathing Steps Patient Completed: Chest;Abdomen;Right  upper leg;Left upper leg (patient assisting with multiple steps) Bathing: 2: Max-Patient completes 3-4 20f10 parts or 25-49% FIM - Upper Body Dressing/Undressing Upper body dressing/undressing steps patient completed: Thread/unthread left sleeve of pullover shirt/dress;Put head through opening of pull over shirt/dress Upper body dressing/undressing: 3: Mod-Patient completed 50-74% of tasks FIM - Lower Body Dressing/Undressing Lower body dressing/undressing steps patient completed: Thread/unthread left pants leg (patient assisting with multiple steps) Lower body dressing/undressing: 1: Total-Patient completed less than 25% of tasks FIM - BControl and instrumentation engineerDevices: Arm rests Bed/Chair Transfer: 4: Supine > Sit: Min A (steadying Pt. > 75%/lift 1 leg);2: Bed > Chair or W/C: Max A (lift and lower assist) FIM - Tub/Shower Transfers Tub/shower Transfers: 0-Activity did not occur or was simulated (patient declined & unsafe at this time)   Refer to Care Plan for Long Term Goals  Recommendations for other services: Neuropsych  Discharge Criteria: Patient will be discharged from OT  if patient refuses treatment 3 consecutive times without medical reason, if treatment goals not met, if there is a change in medical status, if patient makes no progress towards goals or if patient is discharged from hospital.  The above assessment, treatment plan, treatment alternatives and goals were discussed and mutually agreed upon: by patient  Gaye Pollack 05/13/2013, 4:51 PM

## 2013-05-14 ENCOUNTER — Inpatient Hospital Stay (HOSPITAL_COMMUNITY): Payer: BC Managed Care – PPO | Admitting: Physical Therapy

## 2013-05-14 ENCOUNTER — Encounter (HOSPITAL_COMMUNITY): Payer: Self-pay | Admitting: Radiology

## 2013-05-14 ENCOUNTER — Inpatient Hospital Stay (HOSPITAL_COMMUNITY): Payer: BC Managed Care – PPO | Admitting: Occupational Therapy

## 2013-05-14 ENCOUNTER — Inpatient Hospital Stay (HOSPITAL_COMMUNITY): Payer: BC Managed Care – PPO

## 2013-05-14 DIAGNOSIS — I5021 Acute systolic (congestive) heart failure: Secondary | ICD-10-CM | POA: Diagnosis not present

## 2013-05-14 DIAGNOSIS — G8190 Hemiplegia, unspecified affecting unspecified side: Secondary | ICD-10-CM | POA: Diagnosis not present

## 2013-05-14 DIAGNOSIS — Z5189 Encounter for other specified aftercare: Secondary | ICD-10-CM | POA: Diagnosis not present

## 2013-05-14 DIAGNOSIS — I6340 Cerebral infarction due to embolism of unspecified cerebral artery: Secondary | ICD-10-CM | POA: Diagnosis not present

## 2013-05-14 LAB — CBC WITH DIFFERENTIAL/PLATELET
BASOS ABS: 0 10*3/uL (ref 0.0–0.1)
Basophils Relative: 0 % (ref 0–1)
Eosinophils Absolute: 0.1 10*3/uL (ref 0.0–0.7)
Eosinophils Relative: 2 % (ref 0–5)
HCT: 40.4 % (ref 39.0–52.0)
Hemoglobin: 14 g/dL (ref 13.0–17.0)
LYMPHS PCT: 29 % (ref 12–46)
Lymphs Abs: 1.2 10*3/uL (ref 0.7–4.0)
MCH: 32 pg (ref 26.0–34.0)
MCHC: 34.7 g/dL (ref 30.0–36.0)
MCV: 92.4 fL (ref 78.0–100.0)
Monocytes Absolute: 0.4 10*3/uL (ref 0.1–1.0)
Monocytes Relative: 10 % (ref 3–12)
NEUTROS ABS: 2.6 10*3/uL (ref 1.7–7.7)
Neutrophils Relative %: 59 % (ref 43–77)
PLATELETS: 228 10*3/uL (ref 150–400)
RBC: 4.37 MIL/uL (ref 4.22–5.81)
RDW: 13.5 % (ref 11.5–15.5)
WBC: 4.3 10*3/uL (ref 4.0–10.5)

## 2013-05-14 LAB — TROPONIN I: Troponin I: <0.3 ng/mL (ref <0.30)

## 2013-05-14 LAB — D-DIMER, QUANTITATIVE: D-Dimer, Quant: 5.18 ug/mL-FEU — ABNORMAL HIGH (ref 0.00–0.48)

## 2013-05-14 LAB — CK TOTAL AND CKMB (NOT AT ARMC)
CK, MB: 3.5 ng/mL (ref 0.3–4.0)
RELATIVE INDEX: 1.3 (ref 0.0–2.5)
Total CK: 270 U/L — ABNORMAL HIGH (ref 7–232)

## 2013-05-14 MED ORDER — IOHEXOL 350 MG/ML SOLN
100.0000 mL | Freq: Once | INTRAVENOUS | Status: AC | PRN
  Administered 2013-05-14: 100 mL via INTRAVENOUS

## 2013-05-14 NOTE — Progress Notes (Signed)
Occupational Therapy Session Note  Patient Details  Name: Isaac Johnson MRN: [id number] Date of Birth: 10/09/1965  Today's Date: 05/14/2013 Time: 6144-3154 Time Calculation (min): 45 min and missed 30 min PM session due to medical issues and holding therapy  Short Term Goals: Week 1:  OT Short Term Goal 1 (Week 1): Groom: Patient will perform 3/5 grooming tasks standing at sink with min assist OT Short Term Goal 2 (Week 1): Bath:  Min assist to include sit and stand. OT Short Term Goal 3 (Week 1): UB Dressing: Min assist in unsupported sitting OT Short Term Goal 4 (Week 1): LB Dressing:  Mod Assist to include sit and stand OT Short Term Goal 5 (Week 1): RUE:  Patient will position and monitor RUE at all times during BADL and functional mobility with no more than 4 vcs per session  Skilled Therapeutic Interventions/Progress Updates:  Patient resting in bed upon arrival.  Engaged in self care retraining to include sponge bath and grooming (wore hospital gown today secondary to no clean clothes this am).  Focused session on bed mobility, attention to right visual field and right side of body, postural control midline orientation, forced use of RUE & RLE during all functional mobility and during bath.  Patient stood to wash his buttocks and for OT to don adult brief.  When patient's feet in optimal position and he is stabilizing with LUE and RUE on sink while static standing, patient is close supervision-min facilitation and vcs to sustain muscle activity in RLE and shift COG to the left.  When patient washing his buttocks, he varies between min-max assist secondary to pushing tendencies.  Therapy Documentation Precautions:  Precautions Precautions: Fall Precaution Comments: Pusher tendencies, Right hemi, Right inattention Restrictions Weight Bearing Restrictions: No Pain: Denies pain ADL: See FIM for current functional status  Therapy/Group: Individual Therapy   Gaye Pollack 05/14/2013, 10:39 AM

## 2013-05-14 NOTE — Progress Notes (Signed)
D-Dimer 5.18. Notified Plotnikov with result. New order given. CT angio chest to rule out PE. Will continue to monitor. Will notify MD with result.

## 2013-05-14 NOTE — Progress Notes (Signed)
Physical Therapy Session Note  Patient Details  Name: Isaac Johnson MRN: [id number] Date of Birth: 1965/08/03  Today's Date: 05/14/2013 Time: 6789-3810 Time Calculation (min): 60 min  Short Term Goals: Week 1:  PT Short Term Goal 1 (Week 1): Pt will perform supine<>sit with supervision with HOB flat using bed rail. PT Short Term Goal 2 (Week 1): Pt will consistently perform bed<>chair transfer with min A. PT Short Term Goal 3 (Week 1): Pt will perform gait x50' in controlled environment with Total A of single therapist. PT Short Term Goal 4 (Week 1): Pt will negotiate 3 steps with single rail and Max A.  Skilled Therapeutic Interventions/Progress Updates:   W/c propulsion for w/c management Lt UE/LE with mod assist for straight path x 100'. Transfers w/c<>mat, w/c>bed during session performed with mod assist PT facilitation of head hips relationship, forwards weight shifting trunk, and bil LE repositioning during transfer. Repeated forwards lean working on anterior pelvic tilt in prep for sit <> stand. Sit <> stands focusing on forwards weight shift and midline positioning in standing (no UE support). Standing reaching task to Lt, PT facilitating Rt knee control and weightshifting through pelvis. Minimal pushing noted today. Pt progressively needed longer rest breaks but wanted to keep working.   Pt became short of breath intermittently, HR 98, SpO2 98% on RA, pt transferred supine on mat as he began to not look well. BP unable to be retrieved after two attempts, palpated pulse irregular. Pt transferred back to room and back to bed BP 125/95, HR 95 but then would brady down to 45-47 coinciding with SOB, RN called to room. RN went to call MD and while gone pt repeatedly fluctuated to 34-38 then back up to 50-60 bpm. Pt left with nursing and medical team for further assessment.    Therapy Documentation Precautions:  Precautions Precautions: Fall Precaution Comments: Pusher tendencies, Right hemi,  Right inattention Restrictions Weight Bearing Restrictions: No Pain: Pain Assessment Pain Assessment: No/denies pain  See FIM for current functional status  Therapy/Group: Individual Therapy  Georgina Peer 05/14/2013, 12:19 PM

## 2013-05-14 NOTE — Progress Notes (Signed)
Complained of feeling hot and nauseated.  Reported feeling anxious, PRN ativan given at 0141 with relief. RLE down to foot with edema. Wearing condom cath at [facility] to manage incontinence. Right inattention. Slurred speech and flat affect. Cornell Barman

## 2013-05-14 NOTE — Progress Notes (Signed)
Pt returned from therapy to room at 1045. Per therapist pt shoed SOB with feeling "tired" with HR dropping to the mid to low 40s. RN notified with checking of HR ranging between mid 30s-50s. B/P 132/72 manual. O2 100%. Pt stated, "I feel tired and weak." Notifed MD, Plotnikov with findings with new orders: EKG, labs to be drawn, portable chest x-ray, and UA. Order to hold coreg. 2L O2 [place] applied. EKG showed HR ranging 96-100bpm. Notified MD with EKG result. Pt has nagging, dry cough at times.  Awaiting all results. Pt resting with call light within reach, Cont. To monitor pt closely. Order to hold afternoon therapies.

## 2013-05-14 NOTE — Progress Notes (Addendum)
Isaac Johnson is a 48 y.o. male 08-Jan-1966 [id number]  Subjective: No new complaints. Continued mild headache - also poor sleep.  Objective: Vital signs in last 24 hours: Temp:  [97.2 F (36.2 C)-98 F (36.7 C)] 97.2 F (36.2 C) (04/11 0449) Pulse Rate:  [76-81] 76 (04/11 0449) Resp:  [18] 18 (04/11 0449) BP: (118-133)/(78-90) 133/90 mmHg (04/11 0449) SpO2:  [100 %] 100 % (04/11 0449) Weight change:  Last BM Date: 05/12/13  Intake/Output from previous day: 04/10 0701 - 04/11 0700 In: 360 [P.O.:360] Out: 475 [Urine:475]  Physical Exam General: No apparent distress    Lungs: Normal effort. Lungs clear to auscultation, no crackles or wheezes. Cardiovascular: Regular rate and rhythm, no edema Neurological: No new neurological deficits   Lab Results: BMET    Component Value Date/Time   NA 143 05/13/2013 0625   K 3.5* 05/13/2013 0625   CL 106 05/13/2013 0625   CO2 23 05/13/2013 0625   GLUCOSE 85 05/13/2013 0625   BUN 11 05/13/2013 0625   CREATININE 1.31 05/13/2013 0625   CALCIUM 8.4 05/13/2013 0625   GFRNONAA 62* 05/13/2013 0625   GFRAA 72* 05/13/2013 0625   CBC    Component Value Date/Time   WBC 4.3 05/13/2013 0625   RBC 4.05* 05/13/2013 0625   HGB 12.9* 05/13/2013 0625   HCT 37.5* 05/13/2013 0625   PLT 186 05/13/2013 0625   MCV 92.6 05/13/2013 0625   MCH 31.9 05/13/2013 0625   MCHC 34.4 05/13/2013 0625   RDW 13.4 05/13/2013 0625   LYMPHSABS 1.3 05/13/2013 0625   MONOABS 0.4 05/13/2013 0625   EOSABS 0.1 05/13/2013 0625   BASOSABS 0.0 05/13/2013 0625   CBG's (last 3):  No results found for this basename: GLUCAP,  in the last 72 hours LFT's Lab Results  Component Value Date   ALT 22 05/13/2013   AST 20 05/13/2013   ALKPHOS 37* 05/13/2013   BILITOT 1.0 05/13/2013    Studies/Results: No results found.  Medications:  I have reviewed the patient's current medications. Scheduled Medications: . ALPRAZolam  0.25 mg Oral BID  . aspirin  325 mg Oral Q breakfast  . carvedilol   3.125 mg Oral BID WC  . clopidogrel  75 mg Oral Q breakfast  . feeding supplement (ENSURE COMPLETE)  237 mL Oral BID BM  . furosemide  20 mg Oral Daily  . losartan  25 mg Oral Daily  . pantoprazole  80 mg Oral QHS  . polyethylene glycol  17 g Oral Daily  . spironolactone  12.5 mg Oral Daily   PRN Medications: acetaminophen, bisacodyl, LORazepam, ondansetron (ZOFRAN) IV, ondansetron, sorbitol  Assessment/Plan: Active Problems:   CVA (cerebral infarction)  Multiple infarcts in the left MCA distribution secondary to large vessel atherosclerosis. Status post revascularization with clot retrieval 05/07/13- continue ASA+Plavix and ongoping rehab as outlined 2. DVT Prophylaxis/Anticoagulation: SCDs. Monitor for any signs of DVT  3. Pain Management: Tylenol as needed  4. Mood/anxiety: Xanax 0.25 mg twice a day, Ativan 1 mg every 6 as as needed anxiety. Bed alarm for safety  5. Neuropsych: This patient is capable of making decisions on his own behalf.  6. Dysphagia. Dysphagia 2 thin liquids. Followup speech therapy  7.Hypertension. Cozaar 25 mg daily, Lasix 20 mg daily, Aldactone 12.5 mg daily. Monitor with increased mobility  8. Cardiomyopathy/CHF. Monitor for any signs of fluid overload. Followup cardiology services. Plan left heart catheterization as outpatient per cardiology Dr. Aundra Dubin  9. Mild headache - tylenol prn - will  add hydrocodone if tylenol ineffective    Length of stay, days: 2   Valerie A. Asa Lente, MD 05/14/2013, 8:09 AM   10:50 am: pt was seen for "not feeling well" and bradycardia w/a HR<40 per PT. States he has not been feeling well for several days. On Exam: NAD, appears chronically ill. HR>70 Lungs w/decr BS at bases B. Occ PVCs. 11:15 am: feeling better  A/P:   10. [place]. EKG, CXR, troponine, D-dimer, CK-MB, CBC  Start  O2. Hold therapy for now (PT)  11. Bradycardia. EKG. Hold Coreg

## 2013-05-14 NOTE — Plan of Care (Signed)
Problem: RH BLADDER ELIMINATION Goal: RH STG MANAGE BLADDER WITH ASSISTANCE STG Manage Bladder With min Assistance  Outcome: Not Progressing Currently incontinent of bladder, requiring max assistance.

## 2013-05-15 ENCOUNTER — Inpatient Hospital Stay (HOSPITAL_COMMUNITY): Payer: BC Managed Care – PPO | Admitting: Occupational Therapy

## 2013-05-15 LAB — URINE MICROSCOPIC-ADD ON

## 2013-05-15 LAB — URINALYSIS, ROUTINE W REFLEX MICROSCOPIC
BILIRUBIN URINE: NEGATIVE
GLUCOSE, UA: NEGATIVE mg/dL
KETONES UR: NEGATIVE mg/dL
Leukocytes, UA: NEGATIVE
Nitrite: POSITIVE — AB
PH: 7 (ref 5.0–8.0)
PROTEIN: 30 mg/dL — AB
Specific Gravity, Urine: 1.029 (ref 1.005–1.030)
Urobilinogen, UA: 0.2 mg/dL (ref 0.0–1.0)

## 2013-05-15 MED ORDER — ALPRAZOLAM 0.5 MG PO TABS
0.5000 mg | ORAL_TABLET | Freq: Three times a day (TID) | ORAL | Status: DC | PRN
  Administered 2013-05-15 – 2013-05-22 (×10): 0.5 mg via ORAL
  Filled 2013-05-15 (×12): qty 1

## 2013-05-15 NOTE — IPOC Note (Signed)
Overall Plan of Care [facility]) Patient Details Name: Isaac Johnson MRN: [id number] DOB: 1965/09/27  Admitting Diagnosis: L CVA  Hospital Problems: Active Problems:   CVA (cerebral infarction)     Functional Problem List: Nursing    PT Balance;Endurance;Motor;Perception;Safety;Sensory  OT Balance;Cognition;Endurance;Motor;Perception;Safety;Vision  SLP Cognition;Other (comment)  TR         Basic ADL's: OT Eating;Grooming;Bathing;Dressing;Toileting     Advanced  ADL's: OT       Transfers: PT Bed Mobility;Car;Bed to Chair;Furniture;Floor  OT Toilet;Tub/Shower     Locomotion: PT Ambulation;Wheelchair Mobility;Stairs     Additional Impairments: OT Fuctional Use of Upper Extremity  SLP Swallowing      TR      Anticipated Outcomes Item Anticipated Outcome  Self Feeding Supervision/set up  Swallowing  min A   Basic self-care  Supervision/Set up  Toileting  Supervision   Bathroom Transfers Supervision  Bowel/Bladder     Transfers  Supervision  Locomotion  Supervision-Min A  Communication  min A  Cognition  min A  Pain     Safety/Judgment      Therapy Plan: PT Intensity: Minimum of 1-2 x/day ,45 to 90 minutes PT Frequency: 5 out of 7 days PT Duration Estimated Length of Stay: 17-21 days OT Intensity: Minimum of 1-2 x/day, 45 to 90 minutes OT Frequency: 5 out of 7 days OT Duration/Estimated Length of Stay: 17-21 days SLP Intensity: Minumum of 1-2 x/day, 30 to 90 minutes       Team Interventions: Nursing Interventions    PT interventions Ambulation/gait training;Balance/vestibular training;Cognitive remediation/compensation;Disease management/prevention;Discharge planning;DME/adaptive equipment instruction;Functional electrical stimulation;Functional mobility training;Patient/family education;Neuromuscular re-education;Splinting/orthotics;Therapeutic Activities;Stair training;UE/LE Strength taining/ROM;Wheelchair propulsion/positioning;UE/LE Coordination  activities;Visual/perceptual remediation/compensation  OT Interventions Balance/vestibular training;Community reintegration;Discharge planning;DME/adaptive equipment instruction;Functional mobility training;Neuromuscular re-education;Psychosocial support;Patient/family education;Self Care/advanced ADL retraining;Therapeutic Activities;Therapeutic Exercise;UE/LE Strength taining/ROM;Wheelchair propulsion/positioning;Visual/perceptual remediation/compensation;UE/LE Coordination activities  SLP Interventions Cognitive remediation/compensation;Patient/family education;Functional tasks;Dysphagia/aspiration precaution training  TR Interventions    SW/CM Interventions      Team Discharge Planning: Destination: PT-Home ,OT- Home , SLP-Home Projected Follow-up: PT-Home health PT;Outpatient PT;24 hour supervision/assistance, OT-  Home health OT;Outpatient OT;24 hour supervision/assistance, SLP-Other (comment) Projected Equipment Needs: PT-To be determined, OT- Tub/shower bench, SLP-None recommended by SLP Equipment Details: PT-Pt does not own personal assistive devices., OT-Continue to determine if recommend 3 in 1 commode Patient/family involved in discharge planning: PT- Patient,  OT-Patient, SLP-Patient  MD ELOS: 20 days Medical Rehab Prognosis:  Excellent Assessment: The patient has been admitted for CIR therapies. The team will be addressing functional mobility, strength, stamina, balance, safety, adaptive techniques/equipment, self-care, bowel and bladder mgt, patient and caregiver education, speech, swallowing, communication, cognition, NMR, secondary stroke prevention. Goals have been set at supervision for mobility and self-care tasks and min assist for cognition, swallowing, communication.    Meredith Staggers, MD, FAAPMR      See Team Conference Notes for weekly updates to the plan of care

## 2013-05-15 NOTE — Progress Notes (Signed)
Patient complaining of nausea this am . Instructed patient to sit up 30 minutes after meals.   Patient given zofran 4mg  po with relief of nausea. Blood pressure 0952 150/98 pulse 98. 1200 blood pressure 148/98 pulse 98. 1500 blood pressure 124/86 pulse 77. Patient noted with heart fluctuating from low 50's to high 90's. Patient resting at this time . No complaint of pain . Continue with plan of care .                            Mliss Sax

## 2013-05-15 NOTE — Progress Notes (Signed)
Patient remains stable, VS within normal limits. Patient only has complaints of needing to have a BM and requested any medication assistance, offered Dulcolax supp PR and patient accepted. Administered supp at 2153. Assisted patient to the bedpan 2-3 times with miminal results. Nursing staff assisted patient up to the [facility] and patient had large formed BM. Patient reported relief and was able to rest a little better after assisting back to bed. Cont plan of care. Dione Plover Rashea Hoskie

## 2013-05-15 NOTE — Progress Notes (Signed)
Occupational Therapy Session Note  Patient Details  Name: Isaac Johnson MRN: [id number] Date of Birth: Apr 12, 1965  Today's Date: 05/15/2013 Time: 9562-1308 Time Calculation (min): 45 min  Skilled Therapeutic Interventions/Progress Updates: Patient completed dressing with focus on bilateral UE use with R as assist as well as restraining L UE in order to force use of R UE.  Patient also completed bilateral Self ROM exercises for R UE assisting with the left.   When patient's wife saw patient flex R shoulder approx 0 to 45 degrees, she stated, "I did not know you could do that.  Why didn't you tell me or show me."   Patient stated he has new movement in right UE "since yesterday."   Patient's wife stated, "He heart rate drops during therapy or any moving around.  I am waiting to see what the doctor's tests say about it."  Wife indicated she will try to reach rehab doctor to inquire on testing results.    Therapy Documentation Precautions:  Precautions Precautions: Fall Precaution Comments: Pusher tendencies, Right hemi, Right inattention Restrictions Weight Bearing Restrictions: No  Pain:denied   ASee FIM for current functional status  Therapy/Group: Individual Therapy  Herschell Dimes 05/15/2013, 5:56 PM

## 2013-05-15 NOTE — Progress Notes (Signed)
Isaac Johnson is a 48 y.o. male February 16, 1965 [id number]  Subjective: Events yesterday AM reviewed - Pt reports feeling better today - only complains of is poor sleep.  Objective: Vital signs in last 24 hours: Temp:  [98 F (36.7 C)-98.4 F (36.9 C)] 98.4 F (36.9 C) (04/12 0902) Pulse Rate:  [36-101] 98 (04/12 0902) Resp:  [18-22] 18 (04/12 0902) BP: (109-144)/(78-107) 135/99 mmHg (04/12 0902) SpO2:  [98 %-100 %] 99 % (04/12 0902) Weight change:  Last BM Date: 05/13/13  Intake/Output from previous day: 04/11 0701 - 04/12 0700 In: 720 [P.O.:720] Out: 1550 [Urine:1550]  Physical Exam General: No apparent distress    Lungs: Normal effort. Lungs clear to auscultation, no crackles or wheezes. Cardiovascular: Regular rate and rhythm, no edema Neurological: No new neurological deficits   Lab Results: BMET    Component Value Date/Time   NA 143 05/13/2013 0625   K 3.5* 05/13/2013 0625   CL 106 05/13/2013 0625   CO2 23 05/13/2013 0625   GLUCOSE 85 05/13/2013 0625   BUN 11 05/13/2013 0625   CREATININE 1.31 05/13/2013 0625   CALCIUM 8.4 05/13/2013 0625   GFRNONAA 62* 05/13/2013 0625   GFRAA 72* 05/13/2013 0625   CBC    Component Value Date/Time   WBC 4.3 05/14/2013 1330   RBC 4.37 05/14/2013 1330   HGB 14.0 05/14/2013 1330   HCT 40.4 05/14/2013 1330   PLT 228 05/14/2013 1330   MCV 92.4 05/14/2013 1330   MCH 32.0 05/14/2013 1330   MCHC 34.7 05/14/2013 1330   RDW 13.5 05/14/2013 1330   LYMPHSABS 1.2 05/14/2013 1330   MONOABS 0.4 05/14/2013 1330   EOSABS 0.1 05/14/2013 1330   BASOSABS 0.0 05/14/2013 1330   CBG's (last 3):  No results found for this basename: GLUCAP,  in the last 72 hours LFT's Lab Results  Component Value Date   ALT 22 05/13/2013   AST 20 05/13/2013   ALKPHOS 37* 05/13/2013   BILITOT 1.0 05/13/2013    Studies/Results: Ct Angio Chest Pe W/cm &/or Wo Cm  05/14/2013   CLINICAL DATA:  Evaluate for pulmonary embolism. Shortness of breath. Patient with known systolic  dysfunction by echocardiogram.  EXAM: CT ANGIOGRAPHY CHEST WITH CONTRAST  TECHNIQUE: Multidetector CT imaging of the chest was performed using the standard protocol during bolus administration of intravenous contrast. Multiplanar CT image reconstructions and MIPs were obtained to evaluate the vascular anatomy.  CONTRAST:  137mL OMNIPAQUE IOHEXOL 350 MG/ML SOLN  COMPARISON:  CTA chest 05/10/2013  FINDINGS: Satisfactory opacification of the pulmonary arteries. Negative for pulmonary embolism. The thoracic aorta is normal in caliber and is only faintly opacified at the time imaging. No gross evidence of aortic dissection.  The heart is moderately enlarged. The left atrium, left ventricle, and right atrium appear dilated. Trace anterior pericardial effusion noted. Negative for pleural effusion. Prominent subcarinal lymph node is unchanged, measuring up to 1.4 cm short axis. Mildly prominent by hilar lymph nodes are also stable. Esophagus is unremarkable.  There is intralobular septal thickening at the lung apices. There are mild ground-glass opacities in the left perihilar region. No focal consolidation. The trachea and mainstem bronchi are patent.  No acute bony abnormality identified.  Imaging of the upper abdomen demonstrates reflux of contrast into the inferior vena cava and hepatic veins, suggesting elevated right heart pressures.  Bilateral gynecomastia is noted.  Review of the MIP images confirms the above findings.  IMPRESSION: 1. Moderate cardiac enlargement with chamber dilatation and interstitial pulmonary edema.  Cannot exclude mild airspace pulmonary edema in the left perihilar region. Reflux of contrast into the inferior vena cava and hepatic veins suggests elevated right heart pressures. 2. Unchanged mild mediastinal and bi hilar lymphadenopathy. 3. Negative for pulmonary embolism. 4. Bilateral gynecomastia.   Electronically Signed   By: Curlene Dolphin M.D.   On: 05/14/2013 17:46   Dg Chest Port 1  View  05/14/2013   CLINICAL DATA:  SOB; drop in HR  EXAM: PORTABLE CHEST - 1 VIEW  COMPARISON:  CT ANGIO CHEST W/CM &/OR WO/CM dated 05/10/2013; DG CHEST 1V PORT dated 05/07/2013  FINDINGS: Cardiac silhouette enlarged. Low lung volumes. Stable patchy areas of atelectasis. No new focal regions of consolidation nor new focal infiltrates. The osseous structures unremarkable. When compared to prior chest radiograph dated 05/07/2013 the endotracheal tube and NG tube have been removed.  IMPRESSION: No acute cardiopulmonary disease. Stable patchy areas of atelectasis.   Electronically Signed   By: Margaree Mackintosh M.D.   On: 05/14/2013 13:00    Medications:  I have reviewed the patient's current medications. Scheduled Medications: . ALPRAZolam  0.25 mg Oral BID  . aspirin  325 mg Oral Q breakfast  . carvedilol  3.125 mg Oral BID WC  . clopidogrel  75 mg Oral Q breakfast  . feeding supplement (ENSURE COMPLETE)  237 mL Oral BID BM  . furosemide  20 mg Oral Daily  . losartan  25 mg Oral Daily  . pantoprazole  80 mg Oral QHS  . polyethylene glycol  17 g Oral Daily  . spironolactone  12.5 mg Oral Daily   PRN Medications: acetaminophen, bisacodyl, LORazepam, ondansetron (ZOFRAN) IV, ondansetron, sorbitol  Assessment/Plan: Active Problems:   CVA (cerebral infarction)  Multiple infarcts in the left MCA distribution secondary to large vessel atherosclerosis. Status post revascularization with clot retrieval 05/07/13- continue ASA+Plavix and ongoping rehab as outlined 2. DVT Prophylaxis/Anticoagulation: SCDs. Monitor for any signs of DVT  3. Pain Management: Tylenol as needed  4. Mood/anxiety: Xanax 0.25 mg twice a day, Ativan 1 mg every 6 as as needed anxiety. Bed alarm for safety  5. Neuropsych: This patient is capable of making decisions on his own behalf.  6. Dysphagia. Dysphagia 2 thin liquids. Followup speech therapy  7.Hypertension. Cozaar 25 mg daily, Lasix 20 mg daily, Aldactone 12.5 mg daily. Monitor  with increased mobility  8. Cardiomyopathy/CHF. Monitor for any signs of fluid overload. Followup cardiology services. Plan left heart catheterization as outpatient per cardiology Dr. Aundra Dubin  9. Mild headache - tylenol prn - will add hydrocodone if tylenol ineffective 10. Bradycardia 4/11 - resolved with holding Coreg - labs, ECG and CTA chest ok - continue to monitor   Length of stay, days: 3   Marijayne Rauth A. Asa Lente, MD 05/15/2013, 9:05 AM

## 2013-05-15 NOTE — Plan of Care (Signed)
Problem: RH BOWEL ELIMINATION Goal: RH STG MANAGE BOWEL W/EQUIPMENT W/ASSISTANCE STG Manage Bowel With Equipment With min Assistance  Outcome: Not Progressing Patient remains incontinent and requires max assist with equipment   Problem: RH BLADDER ELIMINATION Goal: RH STG MANAGE BLADDER WITH ASSISTANCE STG Manage Bladder With min Assistance  Outcome: Not Progressing Requires total assist  Goal: RH STG MANAGE BLADDER WITH EQUIPMENT WITH ASSISTANCE STG Manage Bladder With Equipment With min Assistance  Outcome: Not Progressing Total assist

## 2013-05-15 NOTE — Progress Notes (Signed)
Patient eating at this time no complaints results of urinalysis given to Dr. Alain Marion no new orders at this time . Awaiting UA results . Continue with plan of care .                            Mliss Sax

## 2013-05-16 ENCOUNTER — Inpatient Hospital Stay (HOSPITAL_COMMUNITY): Payer: BC Managed Care – PPO

## 2013-05-16 ENCOUNTER — Inpatient Hospital Stay (HOSPITAL_COMMUNITY): Payer: BC Managed Care – PPO | Admitting: Physical Therapy

## 2013-05-16 ENCOUNTER — Inpatient Hospital Stay (HOSPITAL_COMMUNITY): Payer: BC Managed Care – PPO | Admitting: Speech Pathology

## 2013-05-16 DIAGNOSIS — I6350 Cerebral infarction due to unspecified occlusion or stenosis of unspecified cerebral artery: Secondary | ICD-10-CM

## 2013-05-16 DIAGNOSIS — I69922 Dysarthria following unspecified cerebrovascular disease: Secondary | ICD-10-CM

## 2013-05-16 DIAGNOSIS — I69959 Hemiplegia and hemiparesis following unspecified cerebrovascular disease affecting unspecified side: Secondary | ICD-10-CM

## 2013-05-16 DIAGNOSIS — I69991 Dysphagia following unspecified cerebrovascular disease: Secondary | ICD-10-CM

## 2013-05-16 NOTE — Progress Notes (Signed)
Occupational Therapy Session Note  Patient Details  Name: Isaac Johnson MRN: [id number] Date of Birth: 09-01-1965  Today's Date: 05/16/2013 Time: 0830-0928 Time Calculation (min): 58 min  Short Term Goals: Week 1:  OT Short Term Goal 1 (Week 1): Groom: Patient will perform 3/5 grooming tasks standing at sink with min assist OT Short Term Goal 2 (Week 1): Bath:  Min assist to include sit and stand. OT Short Term Goal 3 (Week 1): UB Dressing: Min assist in unsupported sitting OT Short Term Goal 4 (Week 1): LB Dressing:  Mod Assist to include sit and stand OT Short Term Goal 5 (Week 1): RUE:  Patient will position and monitor RUE at all times during BADL and functional mobility with no more than 4 vcs per session  Skilled Therapeutic Interventions/Progress Updates:    Pt seen for ADL retraining with focus on dynamic sitting balance, functional use of RUE, attention to R, activity tolerance, and functional transfers. Pt received supine in bed with HR 47 at rest. Pt requesting to complete sponge bath>shower this AM. Completed bathing sitting unsupported EOB with mod-supervision for dynamic sitting balance. Pt with LOB posteriorly and to left when completing LB bathing via crossover technique. Pt required max cues for initiating use of RUE during bathing. Encouraged pt to practice wringing out wash cloth using bil hands and therapist provided [facility] assist for washing LUE.  Pt required cues initially for hemi dressing then demonstrated carryover throughout. Pt required multimodal cues for management of shirt over R shoulder before managing down trunk. Pt completed sit<>stand at mod assist with mod-max assist standing balance secondary to right lean and no initiation to correct. Practiced stand pivot transfers bed>w/c at mod assist and w/c<>toilet with min-mod assist. Pt required max cues for hand placement for each transfer. Pt with SOB at times throughout therapy session however reporting no awareness. Pt's  HR fluctuating 38-108 throughout session with SpO2>96%. RN notified of fluctuating HR. Pt left sitting in w/c with all needs in reach.   Therapy Documentation Precautions:  Precautions Precautions: Fall Precaution Comments: Pusher tendencies, Right hemi, Right inattention Restrictions Weight Bearing Restrictions: No General:   Vital Signs:   Pain: Pt with no report of pain during therapy session.   See FIM for current functional status  Therapy/Group: Individual Therapy  Teneshia Hedeen [place] 05/16/2013, 9:30 AM

## 2013-05-16 NOTE — Progress Notes (Signed)
Speech Language Pathology Daily Session Note  Patient Details  Name: Isaac Johnson MRN: [id number] Date of Birth: May 22, 1965  Today's Date: 05/16/2013 Time: 9379-0240 Time Calculation (min): 25 min  Short Term Goals: Week 1: SLP Short Term Goal 1 (Week 1): Pt. will demonstrate appropriate problem solving abilities during complex activities wtih moderate verbal cues SLP Short Term Goal 2 (Week 1): Pt. will recall and utilize speech strategies in conversation with moderate verbal cues. SLP Short Term Goal 3 (Week 1): Pt. will utilize memory strategies to recall important information with moderate verbal cues  Skilled Therapeutic Interventions: Skilled treatment session focused on addressing dysarthria goals.  SLP educated patient regarding useful strategies that compensate for and increase overall intelligibility.  Patient demonstrated increased intelligibility at the phrase level during picture description task with use of strategies and required Mod verbal cues for utilization during task.  Continue with current plan of care.   FIM:  Comprehension Comprehension Mode: Auditory Comprehension: 5-Understands basic 90% of the time/requires cueing < 10% of the time Expression Expression Mode: Verbal Expression: 4-Expresses basic 75 - 89% of the time/requires cueing 10 - 24% of the time. Needs helper to occlude trach/needs to repeat words. Social Interaction Social Interaction: 5-Interacts appropriately 90% of the time - Needs monitoring or encouragement for participation or interaction. Problem Solving Problem Solving: 5-Solves basic 90% of the time/requires cueing < 10% of the time Memory Memory: 4-Recognizes or recalls 75 - 89% of the time/requires cueing 10 - 24% of the time  Pain Pain Assessment Pain Assessment: No/denies pain  Therapy/Group: Individual Therapy  Carmelia Roller., [place]  [name] 05/16/2013, 4:53 PM

## 2013-05-16 NOTE — Progress Notes (Signed)
[place] Individual Statement of Services  Patient Name:  Isaac Johnson  Date:  05/13/2013  Welcome to the [facility].  Our goal is to provide you with an individualized program based on your diagnosis and situation, designed to meet your specific needs.  With this comprehensive rehabilitation program, you will be expected to participate in at least 3 hours of rehabilitation therapies Monday-Friday, with modified therapy programming on the weekends.  Your rehabilitation program will include the following services:  Physical Therapy (PT), Occupational Therapy (OT), Speech Therapy (ST), 24 hour per day rehabilitation nursing, Therapeutic Recreaction (TR), Neuropsychology, Case Management (Social Worker), Rehabilitation Medicine, Nutrition Services and Pharmacy Services  Weekly team conferences will be held on Wednesdays to discuss your progress.  Your Social Worker will talk with you frequently to get your input and to update you on team discussions.  Team conferences with you and your family in attendance may also be held.  Expected length of stay: 21 days  Overall anticipated outcome: supervision  Depending on your progress and recovery, your program may change. Your Social Worker will coordinate services and will keep you informed of any changes. Your Social Worker's name and contact numbers are listed  below.  The following services may also be recommended but are not provided by the [facility] will be made to provide these services after discharge if needed.  Arrangements include referral to agencies that provide these services.  Your insurance has been verified to be:  [place] Your primary doctor is:  Dr. Sherrie Sport  Pertinent information will be shared with your doctor and your insurance  company.  Social Worker:  Ovidio Kin, LCSW @ [phone] or [phone]  Information discussed with and copy given to patient by: Lennart Pall, 05/13/2013, 11:13 AM

## 2013-05-16 NOTE — Progress Notes (Signed)
Subjective/Complaints: [name] with history of CHF, stomach ulcers, sciatica LLE, brain lipoma; who was admitted to [facility] on 05/06/13 with right side weakness. He was treated with tPA and transferred to [facility] for further treatment. CT head negative for acute changes. 2D echo with EF 25% with diffuse hypokinesis and grade 2 diastolic dysfunction. Carotid dopplers without significant ICA stenosis. Patient with improvement after tPA . 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 Estanislado Pandy. He was evaluated by Dr. Aundra Dubin who who recommends heart cath prior to discharge to rule out CAD as cause of CM and expressed concerns that CVA could be cardioembolic due to low EF. Medications adjusted and . He was started on ASA and plavix for secondary stroke prevention given stent in place. He has had high levels of anxiety with SOB (hyperventilating?) requiring ativan. CTA of chest negative for PE but with evidence of nodal prominence of questionable etiology.  No issues overnite no SOB no fevers No dysuria co Review of Systems - Negative except Right side weak  Objective: Vital Signs: Blood pressure 131/73, pulse 78, temperature 98 F (36.7 C), temperature source Oral, resp. rate 18, height 6' (1.829 m), SpO2 99.00%. Ct Angio Chest Pe W/cm &/or Wo Cm  05/14/2013   CLINICAL DATA:  Evaluate for pulmonary embolism. Shortness of breath. Patient with known systolic dysfunction by echocardiogram.  EXAM: CT ANGIOGRAPHY CHEST WITH CONTRAST  TECHNIQUE: Multidetector CT imaging of the chest was performed using the  standard protocol during bolus administration of intravenous contrast. Multiplanar CT image reconstructions and MIPs were obtained to evaluate the vascular anatomy.  CONTRAST:  113mL OMNIPAQUE IOHEXOL 350 MG/ML SOLN  COMPARISON:  CTA chest 05/10/2013  FINDINGS: Satisfactory opacification of the pulmonary arteries. Negative for pulmonary embolism. The thoracic aorta is normal in caliber and is only faintly opacified at the time imaging. No gross evidence of aortic dissection.  The heart is moderately enlarged. The left atrium, left ventricle, and right atrium appear dilated. Trace anterior pericardial effusion noted. Negative for pleural effusion. Prominent subcarinal lymph node is unchanged, measuring up to 1.4 cm short axis. Mildly prominent by hilar lymph nodes are also stable. Esophagus is unremarkable.  There is intralobular septal thickening at the lung apices. There are mild ground-glass opacities in the left perihilar region. No focal consolidation. The trachea and mainstem bronchi are patent.  No acute bony abnormality identified.  Imaging of the upper abdomen demonstrates reflux of contrast into the inferior vena cava and hepatic veins, suggesting elevated right heart pressures.  Bilateral gynecomastia is noted.  Review of the MIP images confirms the above findings.  IMPRESSION: 1. Moderate cardiac enlargement with chamber dilatation and interstitial pulmonary edema. Cannot exclude mild airspace pulmonary edema in the left perihilar region. Reflux of contrast into the inferior vena cava and hepatic veins suggests elevated right heart pressures. 2. Unchanged mild mediastinal and bi hilar lymphadenopathy. 3. Negative for pulmonary embolism. 4. Bilateral gynecomastia.   Electronically Signed   By: Curlene Dolphin M.D.   On: 05/14/2013 17:46   Dg Chest Port 1 View  05/14/2013   CLINICAL DATA:  SOB; drop in HR  EXAM:  PORTABLE CHEST - 1 VIEW  COMPARISON:  CT ANGIO CHEST W/CM &/OR WO/CM dated 05/10/2013; DG CHEST  1V PORT dated 05/07/2013  FINDINGS: Cardiac silhouette enlarged. Low lung volumes. Stable patchy areas of atelectasis. No new focal regions of consolidation nor new focal infiltrates. The osseous structures unremarkable. When compared to prior chest radiograph dated 05/07/2013 the endotracheal tube and NG tube have been removed.  IMPRESSION: No acute cardiopulmonary disease. Stable patchy areas of atelectasis.   Electronically Signed   By: Margaree Mackintosh M.D.   On: 05/14/2013 13:00   Results for orders placed during the hospital encounter of 05/12/13 (from the past 72 hour(s))  TROPONIN I     Status: None   Collection Time    05/14/13 11:00 AM      Result Value Ref Range   Troponin I <0.30  <0.30 ng/mL   Comment:            Due to the release kinetics of cTnI,     a negative result within the first hours     of the onset of symptoms does not rule out     myocardial infarction with certainty.     If myocardial infarction is still suspected,     repeat the test at appropriate intervals.  CK TOTAL AND CKMB     Status: Abnormal   Collection Time    05/14/13 11:00 AM      Result Value Ref Range   Total CK 270 (*) 7 - 232 U/L   CK, MB 3.5  0.3 - 4.0 ng/mL   Relative Index 1.3  0.0 - 2.5  CBC WITH DIFFERENTIAL     Status: None   Collection Time    05/14/13  1:30 PM      Result Value Ref Range   WBC 4.3  4.0 - 10.5 K/uL   RBC 4.37  4.22 - 5.81 MIL/uL   Hemoglobin 14.0  13.0 - 17.0 g/dL   HCT 40.4  39.0 - 52.0 %   MCV 92.4  78.0 - 100.0 fL   MCH 32.0  26.0 - 34.0 pg   MCHC 34.7  30.0 - 36.0 g/dL   RDW 13.5  11.5 - 15.5 %   Platelets 228  150 - 400 K/uL   Neutrophils Relative % 59  43 - 77 %   Neutro Abs 2.6  1.7 - 7.7 K/uL   Lymphocytes Relative 29  12 - 46 %   Lymphs Abs 1.2  0.7 - 4.0 K/uL   Monocytes Relative 10  3 - 12 %   Monocytes Absolute 0.4  0.1 - 1.0 K/uL   Eosinophils Relative 2  0 - 5 %   Eosinophils Absolute 0.1  0.0 - 0.7 K/uL   Basophils Relative 0  0 - 1 %   Basophils  Absolute 0.0  0.0 - 0.1 K/uL  D-DIMER, QUANTITATIVE     Status: Abnormal   Collection Time    05/14/13  1:30 PM      Result Value Ref Range   D-Dimer, Quant 5.18 (*) 0.00 - 0.48 ug/mL-FEU   Comment:            AT THE INHOUSE ESTABLISHED CUTOFF     VALUE OF 0.48 ug/mL FEU,     THIS ASSAY HAS BEEN DOCUMENTED     IN THE LITERATURE TO HAVE     A SENSITIVITY AND NEGATIVE     PREDICTIVE VALUE OF AT LEAST     98 TO  99%.  THE TEST RESULT     SHOULD BE CORRELATED WITH     AN ASSESSMENT OF THE CLINICAL     PROBABILITY OF DVT / VTE.  URINALYSIS, ROUTINE W REFLEX MICROSCOPIC     Status: Abnormal   Collection Time    05/15/13  8:17 AM      Result Value Ref Range   Color, Urine YELLOW  YELLOW   APPearance CLOUDY (*) CLEAR   Specific Gravity, Urine 1.029  1.005 - 1.030   pH 7.0  5.0 - 8.0   Glucose, UA NEGATIVE  NEGATIVE mg/dL   Hgb urine dipstick SMALL (*) NEGATIVE   Bilirubin Urine NEGATIVE  NEGATIVE   Ketones, ur NEGATIVE  NEGATIVE mg/dL   Protein, ur 30 (*) NEGATIVE mg/dL   Urobilinogen, UA 0.2  0.0 - 1.0 mg/dL   Nitrite POSITIVE (*) NEGATIVE   Leukocytes, UA NEGATIVE  NEGATIVE  URINE MICROSCOPIC-ADD ON     Status: Abnormal   Collection Time    05/15/13  8:17 AM      Result Value Ref Range   Squamous Epithelial / LPF RARE  RARE   WBC, UA 0-2  <3 WBC/hpf   RBC / HPF 3-6  <3 RBC/hpf   Bacteria, UA MANY (*) RARE     HEENT: normal Cardio: RRR and no murmur Resp: CTA B/L and unlabored GI: BS positive and NT.ND Extremity:  Pulses positive and No Edema Skin:   Intact Musc/Skel:  Normal Right hemiparesis UE>LE. Mild sensory deficits on right side. Pt with right central 7 and tongue deviation. Speech dysarthric. Sensation intact to light touch in the upper and lower limbs. motor strength: RUE with trace pecs but otherwise 1+/5 RUE. RLE 3- at HF,KE and KE/KF, 2-/5 at ankle   Assessment/Plan: 1. Functional deficits secondary to L MCA distribution infarct which require 3+ hours per day  of interdisciplinary therapy in a comprehensive inpatient rehab setting. Physiatrist is providing close team supervision and 24 hour management of active medical problems listed below. Physiatrist and rehab team continue to assess barriers to discharge/monitor patient progress toward functional and medical goals. FIM: FIM - Bathing Bathing Steps Patient Completed: Chest;Abdomen;Right upper leg;Left upper leg;Right Arm;Right lower leg (including foot);Left lower leg (including foot) Bathing: 3: Mod-Patient completes 5-7 34f 10 parts or 50-74%  FIM - Upper Body Dressing/Undressing Upper body dressing/undressing steps patient completed: Thread/unthread left sleeve of pullover shirt/dress;Put head through opening of pull over shirt/dress Upper body dressing/undressing: 0: Activity did not occur (no clean clothes this AM) FIM - Lower Body Dressing/Undressing Lower body dressing/undressing steps patient completed: Thread/unthread left pants leg (patient assisting with multiple steps) Lower body dressing/undressing: 1: Total-Patient completed less than 25% of tasks        FIM - Control and instrumentation engineer Devices: Arm rests;Bed rails Bed/Chair Transfer: 3: Supine > Sit: Mod A (lifting assist/Pt. 50-74%/lift 2 legs;3: Sit > Supine: Mod A (lifting assist/Pt. 50-74%/lift 2 legs);2: Bed > Chair or W/C: Max A (lift and lower assist);2: Chair or W/C > Bed: Max A (lift and lower assist)  FIM - Locomotion: Wheelchair Distance: 40 Locomotion: Wheelchair: 1: Travels less than 50 ft with maximal assistance (Pt: 25 - 49%) FIM - Locomotion: Ambulation Locomotion: Ambulation Assistive Devices: Other (comment) (L hallway hand rail) Ambulation/Gait Assistance: 1: +2 Total assist Locomotion: Ambulation: 1: Two helpers  Comprehension Comprehension Mode: Auditory Comprehension: 5-Follows basic conversation/direction: With extra time/assistive device  Expression Expression Mode:  Verbal Expression: 5-Expresses basic 90% of the  time/requires cueing < 10% of the time.  Social Interaction Social Interaction: 5-Interacts appropriately 90% of the time - Needs monitoring or encouragement for participation or interaction.  Problem Solving Problem Solving: 5-Solves basic 90% of the time/requires cueing < 10% of the time  Memory Memory: 5-Recognizes or recalls 90% of the time/requires cueing < 10% of the time  Medical Problem List and Plan:  1. 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 Status post revascularization with clot retrieval  2. DVT Prophylaxis/Anticoagulation: SCDs. Monitor for any signs of DVT  3. Pain Management: Tylenol as needed  4. Mood/anxiety: Xanax 0.25 mg twice a day, Ativan 1 mg every 6 as as needed anxiety. Bed alarm for safety  5. Neuropsych: This patient is capable of making decisions on his own behalf.  6. Dysphagia. Dysphagia 2 thin liquids. Followup speech therapy  7.Hypertension. Cozaar 25 mg daily, Lasix 20 mg daily, Aldactone 12.5 mg daily. Monitor with increased mobility  8. Cardiomyopathy/CHF. Monitor for any signs of fluid overload. Followup cardiology services. Plan left heart catheterization as outpatient per cardiology Dr. Aundra Dubin  9.  UTI- start Cefazolin  LOS (Days) 4 A FACE TO FACE EVALUATION WAS PERFORMED  Charlett Blake 05/16/2013, 8:29 AM

## 2013-05-16 NOTE — Progress Notes (Signed)
Social Work  Social Work Assessment and Plan  Patient Details  Name: Isaac Johnson MRN: [id number] Date of Birth: 09/13/1965  Today's Date: 05/13/2013  Problem List:  Patient Active Problem List   Diagnosis Date Noted  . 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 Anxiety state, unspecified 05/12/2013  . CVA (cerebral infarction) 05/12/2013  . Systolic dysfunction -EF 32% by 2D echo 05/2013 05/11/2013  . 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 LEUKOPENIA, MILD 10/03/2008  . OTHER ABNORMAL BLOOD CHEMISTRY 10/03/2008  . PROTEINURIA 08/20/2006  . HYPERTENSION 07/23/2006  . GERD 07/23/2006   Past Medical History:  Past Medical History  Diagnosis Date  . Hypertension   . Stomach ulcer   . Gait instability   . Proteinuria   . GERD (gastroesophageal reflux disease)   . Insomnia   . Dizziness   . Leukopenia   . Sciatica     left  . Brain lipoma 2010    Dr. Trenton Gammon  . CHF (congestive heart failure)    Past Surgical History:  Past Surgical History  Procedure Laterality Date  . Radiology with anesthesia N/A 05/07/2013    Procedure: RADIOLOGY WITH ANESTHESIA;  Surgeon: Rob Hickman, MD;  Location: [facility];  Service: Radiology;  Laterality: N/A;   Social History:  reports that he has never smoked. He does not have any smokeless tobacco history on file. He reports that he does not drink alcohol or use illicit drugs.  Family / Support Systems Marital Status: Married How Long?: 15 yrs Patient Roles: Spouse;Parent (full time employee) Spouse/Significant Other: wife, Isaac Johnson (H) [phone] or (C) [phone] Children: one daughter, Isaac Johnson, age 53 yrs Anticipated Caregiver: wife to be primary caregiver and arranging others to assist Ability/Limitations of Caregiver: wife works 2nd shift at [employer] as a Chartered loss adjuster: Other (Comment) (works 2nd shift) Family Dynamics: pt describes wife as very supportive   Social History Preferred  language: English Religion:  Cultural Background: NA Education: HS Read: Yes Write: Yes Employment Status: Employed Name of Employer: [employer] Return to Work Plans: TBD - has completed paperwork for STD via Magazine features editor Issues: none Guardian/Conservator: none - per MD, pt capable of making decision on his own behalf   Abuse/Neglect Physical Abuse: Denies Verbal Abuse: Denies Sexual Abuse: Denies Exploitation of patient/patient's resources: Denies Self-Neglect: Denies  Emotional Status Pt's affect, behavior adn adjustment status: pt with flat affect and soft voice.  Lying in bed and admits fatigue from morning therapies.  Denies any emotional distress.  No s/s of depression or anxiety, however, will monitor and refer to neuropsych as appropriate Recent Psychosocial Issues: none Pyschiatric History: none Substance Abuse History: none  Patient / Family Perceptions, Expectations & Goals Pt/Family understanding of illness & functional limitations: pt and wife with basic understanding of pt's stroke and of current functional limitations and anticipated care needs at d/c. Premorbid pt/family roles/activities: pt was working f/t and completely independent Anticipated changes in roles/activities/participation: pt will now require 24/7 assist with wife assuming primary caregiver role. Pt/family expectations/goals: "I want to get my strength back"  [employer]: None Premorbid Home Care/DME Agencies: None Transportation available at discharge: yes, wife Resource referrals recommended: Neuropsychology;Support group (specify)  Discharge Planning Living Arrangements: Spouse/significant other;Children;Other (Comment) (64 year old daughter) Support Systems: Spouse/significant other;Children;Other relatives;Friends/neighbors Type of Residence: Private residence Insurance Resources: Multimedia programmer (specify) Nurse, mental health) Financial Resources:  Employment Financial Screen Referred: No Living Expenses: [place] Management: Patient Does the  patient have any problems obtaining your medications?: No Home Management: pt and wife PTA Patient/Family Preliminary Plans: pt plans to return home with wife and arranged caregivers providing 24/7 assist Barriers to Discharge: Family Support (wife works but hopes to arrange help) Social Work Anticipated Follow Up Needs: HH/OP Expected length of stay: 20-24 days  Clinical Impression Unfortunate gentleman here following a CVA.  Good support from wife, however, she does work f/t 2nd shift.  Will need to work on securing other people to cover her work hours to provide anticipated care needs for pt.  No emotional distress noted or reported.  Will monitor.  Lennart Pall 05/13/2013, 11:09 AM

## 2013-05-16 NOTE — Progress Notes (Signed)
Physical Therapy Session Note  Patient Details  Name: Isaac Johnson MRN: [id number] Date of Birth: 02-18-1965  Today's Date: 05/16/2013 Time: 0930-1030 and [id number] Time Calculation (min): 60 min and 36 min  Short Term Goals: Week 1:  PT Short Term Goal 1 (Week 1): Pt will perform supine<>sit with supervision with HOB flat using bed rail. PT Short Term Goal 2 (Week 1): Pt will consistently perform bed<>chair transfer with min A. PT Short Term Goal 3 (Week 1): Pt will perform gait x50' in controlled environment with Total A of single therapist. PT Short Term Goal 4 (Week 1): Pt will negotiate 3 steps with single rail and Max A.  Skilled Therapeutic Interventions/Progress Updates:    Treatment Session 1: Pt received seated in w/c with quick release belt on for safety. Pt reports feeling fatigued, which he attributes to difficulty sleeping last night. Pt reports that RN is aware. Resting HR fluctuating from 44 bpm to 87 bpm; RN notified. W/c mobility x70' in controlled environment with L hemi technique, min A; w/c mobility distance limited by pt fatigue. In rehab apartment, performed multiple squat pivot transfers from w/c<>couch with min-mod A, manual facilitation of anterior weight shift; verbal cueing focused on setup (w/c parts management) and hand placement. Will require reinforcement. Multiple seated rest breaks required secondary to pt-demonstrated SOB and pt-reported fatigue. Transported pt to room, where pt performed gait x8' in controlled environment with rolling walker, R hand orthosis, and +1Total A; manual facilitation of bilat weight shifting. Pt exhibits narrow BOS, decreased control of RLE advancement. Per pt report of significant fatigue, performed sit>supine with min A for RLE management. Therapist departed with pt semi-reclined in bed with 3 bed rails up, bed alarm on, and all needs within reach. RN notified of heart rate fluctuation, SOB, and decreased activity tolerance.    Treatment Session 2: Pt received semi-reclined in bed; awake and agreeable to therapy. Pt reports feeling more rested secondary to having slept since AM PT session. Pre-session HR WNL; see vital signs for detailed readings. Session focused on increasing pt independence with bed mobility, promoting pt performance of diaphragmatic breathing. Pt performed blocked practice of supine<>sit transfers with HOB flat using bed rail requiring supervision; max multimodal cueing initially required for pt to initiate supine>sit L knee flexion, LUE movement across midline. Specifically addressed transition from R side lying>sit without bed rail to promote closed-chain RUE extension to promote RUE weightbearing, increase muscle activation. Final supine<>sit transfer performed with supervision, increased time, HOB flat, no rails, and no cueing required. Performed sit<>stand from raised bed height without UE support, no assistive device with mod A. Static standing x30 seconds with mod A, with manual facilitation at R axilla, L ribs for upright posture. Transitioned to dynamic standing (bilat weight shifting for pre-gait) with mod A; manual facilitation as described.  Focused on initiating diaphragmatic breathing during breaks in supine, hook lying; pt's LUE at abdomen to increase abdominal excursion, decrease utilization of accessory breathing muscles. Pt with effective within-session carryover at rest; will continue to reinforce during activity. Overall, pt demonstrated improved activity tolerance during PM session as compared with AM session.   Therapy Documentation Precautions:  Precautions Precautions: Fall Precaution Comments: Pusher tendencies, Right hemi, Right inattention Restrictions Weight Bearing Restrictions: No Vital Signs: Therapy Vitals Pulse Rate: 87 Patient Position, if appropriate: Sitting (Resting) Oxygen Therapy SpO2: 98 % O2 Device: None (Room air) Pain: Pain Assessment Pain Assessment:  No/denies pain Pain Score: 0-No pain Locomotion : Ambulation Ambulation/Gait Assistance: 1: +  1 Total assist Wheelchair Mobility Distance: 70   See FIM for current functional status  Therapy/Group: Individual Therapy  Malva Cogan Hobble 05/16/2013, 12:16 PM

## 2013-05-17 ENCOUNTER — Inpatient Hospital Stay (HOSPITAL_COMMUNITY): Payer: BC Managed Care – PPO | Admitting: *Deleted

## 2013-05-17 ENCOUNTER — Inpatient Hospital Stay (HOSPITAL_COMMUNITY): Payer: BC Managed Care – PPO

## 2013-05-17 ENCOUNTER — Encounter (HOSPITAL_COMMUNITY): Payer: Self-pay | Admitting: Cardiology

## 2013-05-17 ENCOUNTER — Inpatient Hospital Stay (HOSPITAL_COMMUNITY): Payer: BC Managed Care – PPO | Admitting: Physical Therapy

## 2013-05-17 DIAGNOSIS — I5022 Chronic systolic (congestive) heart failure: Secondary | ICD-10-CM | POA: Diagnosis present

## 2013-05-17 DIAGNOSIS — G8190 Hemiplegia, unspecified affecting unspecified side: Secondary | ICD-10-CM | POA: Diagnosis not present

## 2013-05-17 DIAGNOSIS — I5021 Acute systolic (congestive) heart failure: Secondary | ICD-10-CM

## 2013-05-17 DIAGNOSIS — I519 Heart disease, unspecified: Secondary | ICD-10-CM

## 2013-05-17 DIAGNOSIS — Z5189 Encounter for other specified aftercare: Secondary | ICD-10-CM | POA: Diagnosis not present

## 2013-05-17 DIAGNOSIS — I6340 Cerebral infarction due to embolism of unspecified cerebral artery: Secondary | ICD-10-CM | POA: Diagnosis not present

## 2013-05-17 DIAGNOSIS — I69959 Hemiplegia and hemiparesis following unspecified cerebrovascular disease affecting unspecified side: Secondary | ICD-10-CM

## 2013-05-17 DIAGNOSIS — I6350 Cerebral infarction due to unspecified occlusion or stenosis of unspecified cerebral artery: Secondary | ICD-10-CM

## 2013-05-17 DIAGNOSIS — I498 Other specified cardiac arrhythmias: Secondary | ICD-10-CM

## 2013-05-17 DIAGNOSIS — I69922 Dysarthria following unspecified cerebrovascular disease: Secondary | ICD-10-CM

## 2013-05-17 DIAGNOSIS — I69991 Dysphagia following unspecified cerebrovascular disease: Secondary | ICD-10-CM

## 2013-05-17 DIAGNOSIS — R001 Bradycardia, unspecified: Secondary | ICD-10-CM | POA: Insufficient documentation

## 2013-05-17 LAB — CBC
HEMATOCRIT: 43.2 % (ref 39.0–52.0)
Hemoglobin: 14.8 g/dL (ref 13.0–17.0)
MCH: 32 pg (ref 26.0–34.0)
MCHC: 34.3 g/dL (ref 30.0–36.0)
MCV: 93.3 fL (ref 78.0–100.0)
Platelets: 271 10*3/uL (ref 150–400)
RBC: 4.63 MIL/uL (ref 4.22–5.81)
RDW: 13.7 % (ref 11.5–15.5)
WBC: 5.4 10*3/uL (ref 4.0–10.5)

## 2013-05-17 LAB — PRO B NATRIURETIC PEPTIDE: PRO B NATRI PEPTIDE: 4808 pg/mL — AB (ref 0–125)

## 2013-05-17 LAB — COMPREHENSIVE METABOLIC PANEL
ALK PHOS: 47 U/L (ref 39–117)
ALT: 24 U/L (ref 0–53)
AST: 24 U/L (ref 0–37)
Albumin: 3.2 g/dL — ABNORMAL LOW (ref 3.5–5.2)
BUN: 16 mg/dL (ref 6–23)
CO2: 20 meq/L (ref 19–32)
Calcium: 8.8 mg/dL (ref 8.4–10.5)
Chloride: 105 mEq/L (ref 96–112)
Creatinine, Ser: 1.25 mg/dL (ref 0.50–1.35)
GFR, EST AFRICAN AMERICAN: 77 mL/min — AB (ref >90)
GFR, EST NON AFRICAN AMERICAN: 66 mL/min — AB (ref >90)
GLUCOSE: 109 mg/dL — AB (ref 70–99)
POTASSIUM: 4.1 meq/L (ref 3.7–5.3)
Sodium: 140 mEq/L (ref 137–147)
TOTAL PROTEIN: 7.1 g/dL (ref 6.0–8.3)
Total Bilirubin: 0.9 mg/dL (ref 0.3–1.2)

## 2013-05-17 LAB — URINE CULTURE
Colony Count: >=100000
Special Requests: NORMAL

## 2013-05-17 MED ORDER — FUROSEMIDE 80 MG PO TABS
80.0000 mg | ORAL_TABLET | Freq: Once | ORAL | Status: AC
  Administered 2013-05-18: 80 mg via ORAL
  Filled 2013-05-17: qty 1

## 2013-05-17 MED ORDER — ALPRAZOLAM 0.5 MG PO TABS
0.5000 mg | ORAL_TABLET | ORAL | Status: AC
  Administered 2013-05-17: 0.5 mg via ORAL
  Filled 2013-05-17: qty 1

## 2013-05-17 MED ORDER — FUROSEMIDE 40 MG PO TABS
40.0000 mg | ORAL_TABLET | Freq: Every day | ORAL | Status: DC
  Administered 2013-05-19 – 2013-05-22 (×4): 40 mg via ORAL
  Filled 2013-05-17 (×7): qty 1

## 2013-05-17 MED ORDER — FUROSEMIDE 40 MG PO TABS
40.0000 mg | ORAL_TABLET | ORAL | Status: AC
  Administered 2013-05-17: 40 mg via ORAL
  Filled 2013-05-17: qty 1

## 2013-05-17 MED ORDER — POTASSIUM CHLORIDE CRYS ER 20 MEQ PO TBCR
40.0000 meq | EXTENDED_RELEASE_TABLET | Freq: Every day | ORAL | Status: DC
  Administered 2013-05-18 – 2013-05-22 (×5): 40 meq via ORAL
  Filled 2013-05-17 (×7): qty 2

## 2013-05-17 MED ORDER — FUROSEMIDE 80 MG PO TABS: 80.0000 mg | ORAL_TABLET | Freq: Once | ORAL | Status: DC

## 2013-05-17 MED ORDER — CEPHALEXIN 250 MG PO CAPS
250.0000 mg | ORAL_CAPSULE | Freq: Three times a day (TID) | ORAL | Status: DC
  Administered 2013-05-17 – 2013-05-19 (×7): 250 mg via ORAL
  Filled 2013-05-17 (×10): qty 1

## 2013-05-17 NOTE — Progress Notes (Signed)
Nursing Note: Pt resting quietly.Asleep.wbb

## 2013-05-17 NOTE — Progress Notes (Signed)
Physical Therapy Session Note  Patient Details  Name: Isaac Johnson MRN: [id number] Date of Birth: [birth year]  Today's Date: 05/17/2013 Time: 3716-9678 and 1300-1330 Time Calculation (min): 60 min and 30 min  Short Term Goals: Week 1:  PT Short Term Goal 1 (Week 1): Pt will perform supine<>sit with supervision with HOB flat using bed rail. PT Short Term Goal 2 (Week 1): Pt will consistently perform bed<>chair transfer with min A. PT Short Term Goal 3 (Week 1): Pt will perform gait x50' in controlled environment with Total A of single therapist. PT Short Term Goal 4 (Week 1): Pt will negotiate 3 steps with single rail and Max A.  Skilled Therapeutic Interventions/Progress Updates:    Treatment Session 1: Pt received semi-reclined in bed with brother present; agreeable to therapy. Session focused on increasing stability/independence with gait. Pt performed w/c mobility x40' in controlled environment with L hemi technique with mod A, tactile cueing at L knee for weight bearing.  Performed gait x5 trials total (2x30' and 3x40') in controlled environment with +2A and w/c follow. Initial trial x30' with 3 musketeers assist; PT positioned under pt's RLE manually stabilizing R knee during RLE stance to prevent R knee buckling. PT positioned under pt's LUE providing bilat weight shifting, verbal cueing for upright posture. Initial trial ended secondary to noted R genu recurvatum during RLE mid-terminal stance. During seated rest break, donned R knee cage to prevent genu recurvatum, R AFO ([place]) for dorsiflexion assist, proximal RLE stability. Mirror positioned anterior to pt for visual feedback. Pt then performed remaining 4 gait trials with bilat HHA, manual facilitation of bilat weight shifting, multimodal cueing for bilat gluteus maximus activation (activation of R gluteus maximus palpable), and max verbal cueing for upright posture. Pt with increasingly more gait stability with gait trial. Pt  transported in w/c to room, where pt was left seated in w/c with quick release belt on for safety, brother present, and all needs within reach.  Pt required frequent rest breaks throughout session secondary to SOB, significant fatigue. Cueing during rest breaks focused on diaphragmatic, pursed-lip breathing. Resting HR WNL; see vital signs for detailed readings. HR response to exercise did appear to appropriately trend upward with increased workload during this session.  Treatment Session 2: Pt received semi-reclined in bed with wife present. Pt agreeable to therapy. Session focused on initiation of hands-on family training/education. Therapist explained, demonstrated RUE positioning to preserve integrity/function of RUE; wife verbalized understanding and gave effective return demonstration. Therapist also explained, demonstrated cueing for initiation, technique of supine>sit transfer (focus on moving contralateral UE across midline to transition into side lying); wife gave effective return demonstration with min cueing from this therapist.   Retrieved alternative w/c cushion to promote pt sitting tolerance between therapy sessions. Performed squat pivot transfer from bed>w/c with mod A, verbal cueing for hand placement. Therapist departed with pt seated in w/c with primary SLP present for session.  Pt's HR response to exercise fluctuated during this session; therefore, frequent, prolonged rest breaks taken between activities until HR normalized prior to resuming activity.   Long term goal addressing stair negotiation modified to 3 stairs with bilat rails secondary to wife report of alternative entrance with 2 rails.  Therapy Documentation Precautions:  Precautions Precautions: Fall Precaution Comments: Pusher tendencies, Right hemi, Right inattention Restrictions Weight Bearing Restrictions: No Vital Signs: Therapy Vitals Temp: 97.9 F (36.6 C) Temp src: Oral Pulse Rate: 50 Resp: 18 BP:  123/96 mmHg Patient Position, if appropriate: Lying Oxygen  Therapy SpO2: 99 % O2 Device: None (Room air) Pain:  Pt reports no pain during AM and PM sessions.  See FIM for current functional status  Therapy/Group: Individual Therapy  Malva Cogan Tyrell Brereton 05/17/2013, 6:42 PM

## 2013-05-17 NOTE — Plan of Care (Signed)
Problem: RH BLADDER ELIMINATION Goal: RH STG MANAGE BLADDER WITH ASSISTANCE STG Manage Bladder With min Assistance  Outcome: Not Progressing Condom cath. At night due to incont.

## 2013-05-17 NOTE — Progress Notes (Signed)
Speech Language Pathology Daily Session Note  Patient Details  Name: Isaac Johnson MRN: [id number] Date of Birth: Jul 01, 1965  Today's Date: 05/17/2013 Time: 9163-8466 Time Calculation (min): 45 min  Short Term Goals: Week 1: SLP Short Term Goal 1 (Week 1): Pt. will demonstrate appropriate problem solving abilities during complex activities wtih moderate verbal cues SLP Short Term Goal 2 (Week 1): Pt. will recall and utilize speech strategies in conversation with moderate verbal cues. SLP Short Term Goal 3 (Week 1): Pt. will utilize memory strategies to recall important information with moderate verbal cues  Skilled Therapeutic Interventions: Skilled treatment focused on speech and cognitive goals. SLP facilitated session with Min cues for recall of 4 speech intelligibility strategies. Pt completed 4-step sequences with Mod I and 6-step sequences with Min cues. He then described the pictures in each sequence, using his speech intelligibility strategies at the sentence level with Min cues. Continue plan of care.   FIM:  Comprehension Comprehension Mode: Auditory Comprehension: 5-Follows basic conversation/direction: With no assist Expression Expression Mode: Verbal Expression: 4-Expresses basic 75 - 89% of the time/requires cueing 10 - 24% of the time. Needs helper to occlude trach/needs to repeat words. Social Interaction Social Interaction: 4-Interacts appropriately 75 - 89% of the time - Needs redirection for appropriate language or to initiate interaction. Problem Solving Problem Solving: 5-Solves basic problems: With no assist Memory Memory: 4-Recognizes or recalls 75 - 89% of the time/requires cueing 10 - 24% of the time  Pain Pain Assessment Pain Assessment: No/denies pain  Therapy/Group: Individual Therapy   Germain Osgood, M.A. CCC-SLP [phone]  Germain Osgood 05/17/2013, 5:00 PM

## 2013-05-17 NOTE — Progress Notes (Signed)
Subjective/Complaints: [name] with history of CHF, stomach ulcers, sciatica LLE, brain lipoma; who was admitted to [facility] on 05/06/13 with right side weakness. He was treated with tPA and transferred to [facility] for further treatment. CT head negative for acute changes. 2D echo with EF 25% with diffuse hypokinesis and grade 2 diastolic dysfunction. Carotid dopplers without significant ICA stenosis. Patient with improvement after tPA . 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 Estanislado Pandy. He was evaluated by Dr. Aundra Dubin who who recommends heart cath prior to discharge to rule out CAD as cause of CM and expressed concerns that CVA could be cardioembolic due to low EF. Medications adjusted and . He was started on ASA and plavix for secondary stroke prevention given stent in place. He has had high levels of anxiety with SOB (hyperventilating?) requiring ativan. CTA of chest negative for PE but with evidence of nodal prominence of questionable etiology.  Slept poorly, deinies pain, bowel or bladder issues, some breathing problems No dysuria co Review of Systems - Negative except Right side weak  Objective: Vital Signs: Blood pressure 128/88, pulse 53, temperature 98 F (36.7 C), temperature source Oral, resp. rate 20, height 6' (1.829 m), SpO2 98.00%. No results found. Results for orders placed during the hospital encounter of 05/12/13 (from the past 72 hour(s))  TROPONIN I     Status: None   Collection Time    05/14/13 11:00 AM      Result Value Ref Range   Troponin I <0.30  <0.30 ng/mL   Comment:             Due to the release kinetics of cTnI,     a negative result within the first hours     of the onset of symptoms does not rule out     myocardial infarction with certainty.     If myocardial infarction is still suspected,     repeat the test at appropriate intervals.  CK TOTAL AND CKMB     Status: Abnormal   Collection Time    05/14/13 11:00 AM      Result Value Ref Range   Total CK 270 (*) 7 - 232 U/L   CK, MB 3.5  0.3 - 4.0 ng/mL   Relative Index 1.3  0.0 - 2.5  CBC WITH DIFFERENTIAL     Status: None   Collection Time    05/14/13  1:30 PM      Result Value Ref Range   WBC 4.3  4.0 - 10.5 K/uL   RBC 4.37  4.22 - 5.81 MIL/uL   Hemoglobin 14.0  13.0 - 17.0 g/dL   HCT 40.4  39.0 - 52.0 %   MCV 92.4  78.0 - 100.0 fL   MCH 32.0  26.0 - 34.0 pg   MCHC 34.7  30.0 - 36.0 g/dL   RDW 13.5  11.5 - 15.5 %   Platelets 228  150 - 400 K/uL   Neutrophils Relative % 59  43 - 77 %   Neutro Abs 2.6  1.7 - 7.7 K/uL   Lymphocytes Relative 29  12 - 46 %  Lymphs Abs 1.2  0.7 - 4.0 K/uL   Monocytes Relative 10  3 - 12 %   Monocytes Absolute 0.4  0.1 - 1.0 K/uL   Eosinophils Relative 2  0 - 5 %   Eosinophils Absolute 0.1  0.0 - 0.7 K/uL   Basophils Relative 0  0 - 1 %   Basophils Absolute 0.0  0.0 - 0.1 K/uL  D-DIMER, QUANTITATIVE     Status: Abnormal   Collection Time    05/14/13  1:30 PM      Result Value Ref Range   D-Dimer, Quant 5.18 (*) 0.00 - 0.48 ug/mL-FEU   Comment:            AT THE INHOUSE ESTABLISHED CUTOFF     VALUE OF 0.48 ug/mL FEU,     THIS ASSAY HAS BEEN DOCUMENTED     IN THE LITERATURE TO HAVE     A SENSITIVITY AND NEGATIVE     PREDICTIVE VALUE OF AT LEAST     98 TO 99%.  THE TEST RESULT     SHOULD BE CORRELATED WITH     AN ASSESSMENT OF THE CLINICAL     PROBABILITY OF DVT / VTE.  URINE CULTURE     Status: None   Collection Time    05/15/13  8:17 AM      Result Value Ref Range   Specimen Description URINE, CLEAN CATCH     Special Requests Normal      Culture  Setup Time       Value: 05/15/2013 13:34     Performed at [facility] Count       Value: >=100,000 COLONIES/ML     Performed at [facility]   Culture       Value: [place]     Performed at [facility]   Report Status PENDING    URINALYSIS, ROUTINE W REFLEX MICROSCOPIC     Status: Abnormal   Collection Time    05/15/13  8:17 AM      Result Value Ref Range   Color, Urine YELLOW  YELLOW   APPearance CLOUDY (*) CLEAR   Specific Gravity, Urine 1.029  1.005 - 1.030   pH 7.0  5.0 - 8.0   Glucose, UA NEGATIVE  NEGATIVE mg/dL   Hgb urine dipstick SMALL (*) NEGATIVE   Bilirubin Urine NEGATIVE  NEGATIVE   Ketones, ur NEGATIVE  NEGATIVE mg/dL   Protein, ur 30 (*) NEGATIVE mg/dL   Urobilinogen, UA 0.2  0.0 - 1.0 mg/dL   Nitrite POSITIVE (*) NEGATIVE   Leukocytes, UA NEGATIVE  NEGATIVE  URINE MICROSCOPIC-ADD ON     Status: Abnormal   Collection Time    05/15/13  8:17 AM      Result Value Ref Range   Squamous Epithelial / LPF RARE  RARE   WBC, UA 0-2  <3 WBC/hpf   RBC / HPF 3-6  <3 RBC/hpf   Bacteria, UA MANY (*) RARE     HEENT: normal Cardio: RRR and no murmur Resp: CTA B/L and unlabored GI: BS positive and NT.ND Extremity:  Pulses positive and No Edema Skin:   Intact Musc/Skel:  Normal Right hemiparesis UE>LE. Mild sensory deficits on right side. Pt with right central 7 and tongue deviation. Speech dysarthric. Sensation intact to light touch in the upper and lower limbs. motor strength: RUE with trace pecs but otherwise 1+/5 RUE. RLE 3- at HF,KE and KE/KF, 2-/5 at ankle  Assessment/Plan: 1. Functional deficits secondary to L MCA distribution infarct which require 3+ hours per day of interdisciplinary therapy in a comprehensive inpatient rehab setting. Physiatrist is providing close team supervision and 24 hour management of active medical problems listed below. Physiatrist and rehab team continue to assess barriers to  discharge/monitor patient progress toward functional and medical goals. FIM: FIM - Bathing Bathing Steps Patient Completed: Chest;Abdomen;Right upper leg;Left upper leg;Right Arm;Right lower leg (including foot);Left lower leg (including foot);Buttocks;Front perineal area Bathing: 4: Min-Patient completes 8-9 64f 10 parts or 75+ percent  FIM - Upper Body Dressing/Undressing Upper body dressing/undressing steps patient completed: Thread/unthread left sleeve of pullover shirt/dress;Put head through opening of pull over shirt/dress;Pull shirt over trunk Upper body dressing/undressing: 4: Min-Patient completed 75 plus % of tasks FIM - Lower Body Dressing/Undressing Lower body dressing/undressing steps patient completed: Thread/unthread left underwear leg;Thread/unthread left pants leg;Don/Doff left shoe Lower body dressing/undressing: 3: Mod-Patient completed 50-74% of tasks  FIM - Toileting Toileting: 1: Total-Patient completed zero steps, helper did all 3 (per Blen Enuol, NT past bm  evening of 4/11)  FIM - Radio producer Devices: Grab bars Toilet Transfers: 3-To toilet/BSC: Mod A (lift or lower assist);2-From toilet/BSC: Max A (lift and lower assist)  FIM - Control and instrumentation engineer Devices: Bed rails Bed/Chair Transfer: 5: Supine > Sit: Supervision (verbal cues/safety issues);5: Sit > Supine: Supervision (verbal cues/safety issues)  FIM - Locomotion: Wheelchair Distance: 70 Locomotion: Wheelchair: 0: Activity did not occur FIM - Locomotion: Ambulation Locomotion: Ambulation Assistive Devices: Walker - Rolling;Orthosis;Other (comment) (R hand orthosis) Ambulation/Gait Assistance: 1: +1 Total assist Locomotion: Ambulation: 0: Activity did not occur  Comprehension Comprehension Mode: Auditory Comprehension: 5-Understands basic 90% of the time/requires cueing < 10% of the time  Expression Expression Mode: Verbal Expression:  4-Expresses basic 75 - 89% of the time/requires cueing 10 - 24% of the time. Needs helper to occlude trach/needs to repeat words.  Social Interaction Social Interaction: 4-Interacts appropriately 75 - 89% of the time - Needs redirection for appropriate language or to initiate interaction.  Problem Solving Problem Solving: 5-Solves basic 90% of the time/requires cueing < 10% of the time  Memory Memory: 4-Recognizes or recalls 75 - 89% of the time/requires cueing 10 - 24% of the time  Medical Problem List and Plan:  1. 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 Status post revascularization with clot retrieval  2. DVT Prophylaxis/Anticoagulation: SCDs. Monitor for any signs of DVT  3. Pain Management: Tylenol as needed  4. Mood/anxiety: Xanax 0.25 mg twice a day, Ativan 1 mg every 6 as as needed anxiety. Bed alarm for safety  5. Neuropsych: This patient is capable of making decisions on his own behalf.  6. Dysphagia. Dysphagia 2 thin liquids. Followup speech therapy  7.Hypertension. Cozaar 25 mg daily, Lasix 20 mg daily, Aldactone 12.5 mg daily. Monitor with increased mobility  8. Cardiomyopathy/CHF. Monitor for any signs of fluid overload. Followup cardiology services. Plan left heart catheterization as outpatient per cardiology Dr. Aundra Dubin May have orthopnea at noc elevate HOB 9.  UTI- start Cefazolin, check micro  LOS (Days) 5 A FACE TO FACE EVALUATION WAS PERFORMED  Charlett Blake 05/17/2013, 7:51 AM

## 2013-05-17 NOTE — Progress Notes (Signed)
Occupational Therapy Session Note  Patient Details  Name: Isaac Johnson MRN: [id number] Date of Birth: Sep 12, 1965  Today's Date: 05/17/2013 Time: 1000-1100 Time Calculation (min): 60 min  Short Term Goals: Week 1:  OT Short Term Goal 1 (Week 1): Groom: Patient will perform 3/5 grooming tasks standing at sink with min assist OT Short Term Goal 2 (Week 1): Bath:  Min assist to include sit and stand. OT Short Term Goal 3 (Week 1): UB Dressing: Min assist in unsupported sitting OT Short Term Goal 4 (Week 1): LB Dressing:  Mod Assist to include sit and stand OT Short Term Goal 5 (Week 1): RUE:  Patient will position and monitor RUE at all times during BADL and functional mobility with no more than 4 vcs per session  Skilled Therapeutic Interventions/Progress Updates:    Pt seen for ADL retraining with focus on activity tolerance, functional transfers, functional use of RUE, attention to right. Pt received sitting in w/c with brother present. Pt agreeable to shower this AM. Completed stand pivot transfers w/c<>TTB (walk-in shower) at mod assist and manual facilitation at trunk for hip extension in standing. Pt required max cues for attention to positioning of RUE throughout session and min assist (HOH) to use RUE to wash LUE. Completed sit<>stand x5 throughout session with min-mod assist. Pt demonstrating lateral lean in standing requiring verbal cues to correct. Pt self-corrected with hemi dressing technique for LB dressing, however required assist to completely thread RLE. Donned socks using crossover technique requiring assist for positioning of RLE. At end of session pt requesting to return to bed and assisted with mod assist. Pt left supine in bed with HOB elevated and pillow placed for optimal positioning of RUE. Pt required cues for frequent rest breaks throughout session d/t SOB. SpO2>98% throughout and HR ranging 55-98. RN notified.   Therapy Documentation Precautions:   Precautions Precautions: Fall Precaution Comments: Pusher tendencies, Right hemi, Right inattention Restrictions Weight Bearing Restrictions: No General:   Vital Signs: Therapy Vitals Pulse Rate: 111 Patient Position, if appropriate: Sitting Oxygen Therapy SpO2: 99 % O2 Device: None (Room air) Pain:  No report of pain during therapy session.  Other Treatments:    See FIM for current functional status  Therapy/Group: Individual Therapy  Herberta Pickron N Walton Digilio 05/17/2013, 11:02 AM

## 2013-05-17 NOTE — Progress Notes (Signed)
Nursing Note: Rounded on pt and pt awake and complains that he cannot get to sleep.Gave xanax 0.5 mg at 2300.Paged on-call and made aware of dose given earlier and pt's inability to get to sleep and obtained a one time order to repeat the dose now x1 .wbb
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
## 2013-05-17 NOTE — Progress Notes (Signed)
Nursing Note: Pt called and stated that he was awake.Turned off TV and asked pt to try to relax and get back to sleep as I had no more medicine that I could give for sleep.Pt calm,looks as if he might go right back to sleep and was agreeable.wbb

## 2013-05-17 NOTE — Progress Notes (Signed)
Nursing Note: Gave xanax 0.5 for a total of 1 mg tonight.wbb

## 2013-05-18 ENCOUNTER — Inpatient Hospital Stay (HOSPITAL_COMMUNITY): Payer: BC Managed Care – PPO | Admitting: Occupational Therapy

## 2013-05-18 ENCOUNTER — Encounter (HOSPITAL_COMMUNITY): Payer: BC Managed Care – PPO

## 2013-05-18 ENCOUNTER — Inpatient Hospital Stay (HOSPITAL_COMMUNITY): Payer: BC Managed Care – PPO | Admitting: Physical Therapy

## 2013-05-18 ENCOUNTER — Inpatient Hospital Stay (HOSPITAL_COMMUNITY): Payer: BC Managed Care – PPO | Admitting: Speech Pathology

## 2013-05-18 DIAGNOSIS — I69922 Dysarthria following unspecified cerebrovascular disease: Secondary | ICD-10-CM

## 2013-05-18 DIAGNOSIS — I509 Heart failure, unspecified: Secondary | ICD-10-CM

## 2013-05-18 DIAGNOSIS — I6350 Cerebral infarction due to unspecified occlusion or stenosis of unspecified cerebral artery: Secondary | ICD-10-CM

## 2013-05-18 DIAGNOSIS — I69959 Hemiplegia and hemiparesis following unspecified cerebrovascular disease affecting unspecified side: Secondary | ICD-10-CM

## 2013-05-18 DIAGNOSIS — I69991 Dysphagia following unspecified cerebrovascular disease: Secondary | ICD-10-CM

## 2013-05-18 MED ORDER — TRAMADOL HCL 50 MG PO TABS
50.0000 mg | ORAL_TABLET | Freq: Four times a day (QID) | ORAL | Status: DC | PRN
  Filled 2013-05-18: qty 1
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
## 2013-05-18 NOTE — Progress Notes (Addendum)
Physical Therapy Session Note  Patient Details  Name: Isaac Johnson MRN: [id number] Date of Birth: [birth year]  Today's Date: 05/18/2013 Time: 0830-0930 Time Calculation (min): 60 min  Short Term Goals: Week 1:  PT Short Term Goal 1 (Week 1): Pt will perform supine<>sit with supervision with HOB flat using bed rail. PT Short Term Goal 2 (Week 1): Pt will consistently perform bed<>chair transfer with min A. PT Short Term Goal 3 (Week 1): Pt will perform gait x50' in controlled environment with Total A of single therapist. PT Short Term Goal 4 (Week 1): Pt will negotiate 3 steps with single rail and Max A.  Skilled Therapeutic Interventions/Progress Updates:    Pt received semi-reclined in bed; agreeable to session. Pt reports nausea earlier this AM, which has decreased since RN addressed nausea with medication. Session focused on increasing stability/independence with functional transfers. Supine>sit with HOB flat, no rails, supervision with increased time and mod verbal cueing to manage RLE with effective return demonstration. Seated EOB, assisted pt in donning pants, shirt, Ted hose, and shoes; min guard and min verbal cueing for postural awareness secondary to posterior trunk lean.   Pt performed w/c mobility x65' in controlled environment with L hemi technique; trial ended per pt request secondary to pt fatigue. Transported pt remaining distance to gym in w/c secondary to pt fatigue. In gym, performed multiple squat pivot transfers (bilat directions) with min-mod A, mod multimodal cueing for safe transfer setup. Following initial transfer, pt demonstrates SOB, HR ranging form 32 to 100 bpm. Resumed activity after HR stable at 81 bpm and SOB rated 2 out of 10. Multiple seated rest breaks taken between each transfer. Performed gait x12' in controlled environment with LUE support at L hallway hand rail with max A, manual facilitation of bilat weight shifting, min cueing for upright posture. Pt with  narrow BOS, decreased control of RLE during RLE advancement. Transported pt to room, where session ended.  Pt continuing to insist on getting back into bed after session secondary to pt-reported sciatica, feeling tired due to difficulty sleeping last night. PT explained importance of increasing sitting tolerance. Pt verbalized understanding, agreed to sitting in bedside chair. Therapist departed with pt semi-reclined in bedside chair with bilat LE's elevated, quick release belt on for safety, and all needs within reach. Nurse tech notified that pt was continent of bladder x2 during this session.   Therapy Documentation Precautions:  Precautions Precautions: Fall Precaution Comments: Pusher tendencies, Right hemi, Right inattention Restrictions Weight Bearing Restrictions: No Pain: Pain Assessment Pain Assessment: No/denies pain Pain Score: 0-No pain Locomotion : Ambulation Ambulation/Gait Assistance: 2: Max assist Wheelchair Mobility Distance: 65   See FIM for current functional status  Therapy/Group: Individual Therapy  Malva Cogan Hobble 05/18/2013, 10:49 AM

## 2013-05-18 NOTE — Progress Notes (Signed)
Social Work Patient ID: Gala Lewandowsky, male   DOB: 1966-01-02, 48 y.o.   MRN: [id number] Met with pt and spoke with wife via telephone to discuss team conference goals-supervision level and discharge 4/30. Wife concerned about pt's heart and wants cardiac cath prior to discharge.  Aware up to MD and if feel he is strong and medically stable enough. Have encouraged wife to come in and attend therapies with pt to see his progress.  Aware Neuro-psych to see tomorrow.  Continue to work on discharge plans.

## 2013-05-18 NOTE — Patient Care Conference (Signed)
Inpatient RehabilitationTeam Conference and Plan of Care Update Date: 05/18/2013   Time: 10;40 AM    Patient Name: Isaac Johnson      Medical Record Number: [id number]  Date of Birth: Feb 05, 1965 Sex: Male         Room/Bed: 4W07C/4W07C-01 Payor Info: Payor: [place] / Plan: [employer]  PPO / Product Type: *No Product type* /    Admitting Diagnosis: L CVA  Admit Date/Time:  05/12/2013  5:03 PM Admission Comments: No comment available   Primary Diagnosis:  <principal problem not specified> Principal Problem: <principal problem not specified>  Patient Active Problem List   Diagnosis Date Noted  . Bradycardia 05/17/2013  . Acute systolic heart failure   . 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 Anxiety state, unspecified 05/12/2013  . CVA (cerebral infarction) 05/12/2013  . Systolic dysfunction -EF 82% by 2D echo 05/2013 05/11/2013  . 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 LEUKOPENIA, MILD 10/03/2008  . OTHER ABNORMAL BLOOD CHEMISTRY 10/03/2008  . PROTEINURIA 08/20/2006  . HYPERTENSION 07/23/2006  . GERD 07/23/2006    Expected Discharge Date: Expected Discharge Date: 06/02/13  Team Members Present: Physician leading conference: Dr. Alysia Penna Social Worker Present: Ovidio Kin, LCSW;Jenny Prevatt, LCSW Nurse Present: Henri Medal, RN PT Present: Georjean Mode, PT;Blair Hobble, PT OT Present: Gareth Morgan, Starling Manns, OT SLP Present: Germain Osgood, SLP PPS Coordinator present : Ileana Ladd, Lelan Pons, RN, CRRN     Current Status/Progress Goal Weekly Team Focus  Medical   anxiety, tachy/brady. SOB with exertion  cardiology  follow up, Neuro psych  maintain relaxation   Bowel/Bladder   incont of bladder and bowel, condom cath on @HS   To be continent of B&B   Timed toileting    Swallow/Nutrition/ Hydration   Dys 3 textures and thin liquids with intermittent supervision  supervision with least restrictive  PO  diet tolerance, use of strategies, trials of regular textures   ADL's   mod assist functional transfers and LB dressing, min assist UB dressing, min assist bathing  supervision overall  family training/education, attention to right, standing balance, functional transfers, hemi dressing technique, activity tolerance   Mobility   Supervision-Min A with bed mobility; Mod A with transfers; +2A with gait; Mod A with w/c mobility. Participation limited by fatigue, SOB  Supervision overall  Transfer training, postural/gait stability, activity tolerance, stair negotiation, R-sided attention, initiation of hands-on family training   Communication   Min-Mod cues for speech strategies at the sentence level  Min  increase use of strategies   Safety/Cognition/ Behavioral Observations  Min for sequencing, recall of information  Min (will need to be upgraded)  complex problem solving, recall of information, use of memory aids   Pain   No c/o pain         Skin   No skin issues            *See Care Plan and progress notes for long and short-term goals.  Barriers to Discharge: sciatic pain    Possible Resolutions to Barriers:  see above , med management    Discharge Planning/Teaching Needs:  Home with wife and cargiver wife to arrange.  She has been here and observed in therapies      Team Discussion:  Right side inattention-anxiety issues-Neuro-psych to see tomorrow.  Working on activity tolerance.  Follow up cardiac cath as OP.  SOB monitoring his heart rate in therapies.  Check MD regarding parameters of HR  Revisions to Treatment Plan:  None   Continued Need for Acute Rehabilitation Level of Care: The patient requires daily medical management by a physician with specialized training in physical medicine and rehabilitation for the following conditions: Daily direction of a multidisciplinary physical rehabilitation program to ensure safe treatment while eliciting the highest outcome that is  of practical value to the patient.: Yes Daily medical management of patient stability for increased activity during participation in an intensive rehabilitation regime.: Yes Daily analysis of laboratory values and/or radiology reports with any subsequent need for medication adjustment of medical intervention for : Neurological problems;Cardiac problems  Elease Hashimoto 05/18/2013, 2:46 PM

## 2013-05-18 NOTE — Progress Notes (Signed)
Subjective/Complaints: [name] with history of CHF, stomach ulcers, sciatica LLE, brain lipoma; who was admitted to [facility] on 05/06/13 with right side weakness. He was treated with tPA and transferred to [facility] for further treatment. CT head negative for acute changes. 2D echo with EF 25% with diffuse hypokinesis and grade 2 diastolic dysfunction. Carotid dopplers without significant ICA stenosis. Patient with improvement after tPA . 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 Estanislado Pandy. He was evaluated by Dr. Aundra Dubin who who recommends heart cath prior to discharge to rule out CAD as cause of CM and expressed concerns that CVA could be cardioembolic due to low EF. Medications adjusted and . He was started on ASA and plavix for secondary stroke prevention given stent in place. He has had high levels of anxiety with SOB (hyperventilating?) requiring ativan. CTA of chest negative for PE but with evidence of nodal prominence of questionable etiology.  No CP or SOB No dysuria co Review of Systems - Negative except Right side weak  Objective: Vital Signs: Blood pressure 140/100, pulse 83, temperature 97.9 F (36.6 C), temperature source Oral, resp. rate 18, height 6' (1.829 m), SpO2 100.00%. No results found. Results for orders placed during the hospital encounter of 05/12/13 (from the past 72 hour(s))  COMPREHENSIVE METABOLIC PANEL     Status: Abnormal   Collection Time    05/17/13 10:58 AM      Result Value Ref Range   Sodium 140  137 - 147 mEq/L   Potassium 4.1  3.7 - 5.3 mEq/L   Chloride 105   96 - 112 mEq/L   CO2 20  19 - 32 mEq/L   Glucose, Bld 109 (*) 70 - 99 mg/dL   BUN 16  6 - 23 mg/dL   Creatinine, Ser 1.25  0.50 - 1.35 mg/dL   Calcium 8.8  8.4 - 10.5 mg/dL   Total Protein 7.1  6.0 - 8.3 g/dL   Albumin 3.2 (*) 3.5 - 5.2 g/dL   AST 24  0 - 37 U/L   ALT 24  0 - 53 U/L   Alkaline Phosphatase 47  39 - 117 U/L   Total Bilirubin 0.9  0.3 - 1.2 mg/dL   GFR calc non Af Amer 66 (*) >90 mL/min   GFR calc Af Amer 77 (*) >90 mL/min   Comment: (NOTE)     The eGFR has been calculated using the CKD EPI equation.     This calculation has not been validated in all clinical situations.     eGFR's persistently <90 mL/min signify possible Chronic Kidney     Disease.  CBC     Status: None   Collection Time    05/17/13 10:58 AM      Result Value Ref Range   WBC 5.4  4.0 - 10.5 K/uL   RBC 4.63  4.22 - 5.81 MIL/uL   Hemoglobin 14.8  13.0 - 17.0 g/dL   HCT 43.2  39.0 - 52.0 %   MCV 93.3  78.0 - 100.0 fL   MCH 32.0  26.0 - 34.0 pg   MCHC 34.3  30.0 - 36.0  g/dL   RDW 13.7  11.5 - 15.5 %   Platelets 271  150 - 400 K/uL  PRO B NATRIURETIC PEPTIDE     Status: Abnormal   Collection Time    05/17/13  3:52 PM      Result Value Ref Range   Pro B Natriuretic peptide (BNP) 4808.0 (*) 0 - 125 pg/mL     HEENT: normal Cardio: RRR and no murmur Resp: CTA B/L and unlabored GI: BS positive and NT.ND Extremity:  Pulses positive and No Edema Skin:   Intact Musc/Skel:  Normal Right hemiparesis UE>LE. Mild sensory deficits on right side. Pt with right central 7 and tongue deviation. Speech dysarthric. Sensation intact to light touch in the upper and lower limbs. motor strength: RUE 3- delt,bi, 2- tri, grip. RLE 3- at HF,KE and KE/KF, 2-/5 at ankle   Assessment/Plan: 1. Functional deficits secondary to L MCA distribution infarct which require 3+ hours per day of interdisciplinary therapy in a comprehensive inpatient rehab setting. Physiatrist is providing close team supervision and 24 hour  management of active medical problems listed below. Physiatrist and rehab team continue to assess barriers to discharge/monitor patient progress toward functional and medical goals. Team conference today please see physician documentation under team conference tab, met with team face-to-face to discuss problems,progress, and goals. Formulized individual treatment plan based on medical history, underlying problem and comorbidities. FIM: FIM - Bathing Bathing Steps Patient Completed: Chest;Abdomen;Right upper leg;Left upper leg;Right Arm;Right lower leg (including foot);Left lower leg (including foot);Buttocks;Front perineal area Bathing: 4: Min-Patient completes 8-9 [id number] parts or 75+ percent  FIM - Upper Body Dressing/Undressing Upper body dressing/undressing steps patient completed: Thread/unthread left sleeve of pullover shirt/dress;Put head through opening of pull over shirt/dress;Pull shirt over trunk Upper body dressing/undressing: 0: Wears gown/pajamas-no public clothing (declining donning clothing) FIM - Lower Body Dressing/Undressing Lower body dressing/undressing steps patient completed: Thread/unthread left underwear leg;Don/Doff left sock;Don/Doff right sock Lower body dressing/undressing: 3: Mod-Patient completed 50-74% of tasks  FIM - Toileting Toileting: 1: Total-Patient completed zero steps, helper did all 3 (per Blen Enuol, NT past bm  evening of 4/11)  FIM - TRadio producerDevices: Grab bars Toilet Transfers: 3-To toilet/BSC: Mod A (lift or lower assist);2-From toilet/BSC: Max A (lift and lower assist)  FIM - BControl and instrumentation engineerDevices: Bed rails Bed/Chair Transfer: 4: Sit > Supine: Min A (steadying pt. > 75%/lift 1 leg);3: Bed > Chair or W/C: Mod A (lift or lower assist);4: Supine > Sit: Min A (steadying Pt. > 75%/lift 1 leg)  FIM - Locomotion: Wheelchair Distance: 40 Locomotion: Wheelchair: 0: Activity did not  occur FIM - Locomotion: Ambulation Locomotion: Ambulation Assistive Devices: Orthosis;Other (comment) (R AFO; R knee cage) Ambulation/Gait Assistance: 1: +2 Total assist Locomotion: Ambulation: 0: Activity did not occur  Comprehension Comprehension Mode: Auditory Comprehension: 5-Follows basic conversation/direction: With no assist  Expression Expression Mode: Verbal Expression: 4-Expresses basic 75 - 89% of the time/requires cueing 10 - 24% of the time. Needs helper to occlude trach/needs to repeat words.  Social Interaction Social Interaction: 4-Interacts appropriately 75 - 89% of the time - Needs redirection for appropriate language or to initiate interaction.  Problem Solving Problem Solving: 5-Solves basic problems: With no assist  Memory Memory: 4-Recognizes or recalls 75 - 89% of the time/requires cueing 10 - 24% of the time  Medical Problem List and Plan:  1. 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 Status post revascularization with  clot retrieval  2. DVT Prophylaxis/Anticoagulation: SCDs. Monitor for any signs of DVT  3. Pain Management: Tylenol as needed  4. Mood/anxiety: Xanax 0.25 mg twice a day, Ativan 1 mg every 6 as as needed anxiety. Bed alarm for safety  5. Neuropsych: This patient is capable of making decisions on his own behalf.  6. Dysphagia. Dysphagia 2 thin liquids. Followup speech therapy  7.Hypertension. Cozaar 25 mg daily, Lasix 20 mg daily, Aldactone 12.5 mg daily. Monitor with increased mobility  8. Cardiomyopathy/CHF. Monitor for any signs of fluid overload. Followup cardiology services. Plan left heart catheterization as outpatient per cardiology Dr. Aundra Dubin May have orthopnea at noc elevate HOB 9.  UTI- citrobacter sens Cefazolin,   LOS (Days) 6 A FACE TO FACE EVALUATION WAS PERFORMED  Charlett Blake 05/18/2013, 8:24 AM

## 2013-05-18 NOTE — Progress Notes (Signed)
Occupational Therapy Session Note  Patient Details  Name: Isaac Johnson MRN: [id number] Date of Birth: [birth year]  Today's Date: 05/18/2013 Time: 1345-1430 Time Calculation (min): 45 min  Short Term Goals: Week 1:  OT Short Term Goal 1 (Week 1): Groom: Patient will perform 3/5 grooming tasks standing at sink with min assist OT Short Term Goal 2 (Week 1): Bath:  Min assist to include sit and stand. OT Short Term Goal 3 (Week 1): UB Dressing: Min assist in unsupported sitting OT Short Term Goal 4 (Week 1): LB Dressing:  Mod Assist to include sit and stand OT Short Term Goal 5 (Week 1): RUE:  Patient will position and monitor RUE at all times during BADL and functional mobility with no more than 4 vcs per session      Skilled Therapeutic Interventions/Progress Updates:    Pt seen this session to address RUE functional use and to facilitate active movement.  Pt was in bed and needed min A to sit EOB and mod to w/c. He transferred to mat to work on RUE weight bearing with side to side shifting. He used UE ranger to facilitate shoulder/ elbow movements with guiding assist.  Pt began to c/o shortness of breath. HR and O2 levels monitored constantly through session.  O2 was steadily 98% the entire session but HR fluctuated between 44 and 85 bpm.  Pt seemed very anxious, but denied feeling that way. He had an extremely difficult time with slow nasal  Breathing. He requested to lay down on mat, but that seemed to make his breathing more labored. Pt stated he needed to use bathroom. He completed a very smooth stand pivot with min A with bar to and from toilet. Pt stood with min A to adjust his clothing over hips. No c/o SOB during BR activity. Pt was then taken to his SLP session.  Therapy Documentation Precautions:  Precautions Precautions: Fall Precaution Comments: Pusher tendencies, Right hemi, Right inattention Restrictions Weight Bearing Restrictions: No       Pain: Pain Assessment Pain  Assessment: No/denies pain ADL:  See FIM for current functional status  Therapy/Group: Individual Therapy  Harlene Ramus 05/18/2013, 2:54 PM

## 2013-05-18 NOTE — Progress Notes (Signed)
Occupational Therapy Session Note  Patient Details  Name: Isaac Johnson MRN: [id number] Date of Birth: 07-Sep-1965  Today's Date: 05/18/2013 Time: 1120-1205 Time Calculation (min): 45 min  Short Term Goals: Week 1:  OT Short Term Goal 1 (Week 1): Groom: Patient will perform 3/5 grooming tasks standing at sink with min assist OT Short Term Goal 2 (Week 1): Bath:  Min assist to include sit and stand. OT Short Term Goal 3 (Week 1): UB Dressing: Min assist in unsupported sitting OT Short Term Goal 4 (Week 1): LB Dressing:  Mod Assist to include sit and stand OT Short Term Goal 5 (Week 1): RUE:  Patient will position and monitor RUE at all times during BADL and functional mobility with no more than 4 vcs per session  Skilled Therapeutic Interventions/Progress Updates:    Pt seen for ADL retraining with focus on functional use of RUE, sitting balance, sit<>stand, and activity tolerance. Pt received supine in bed requesting for sponge bath only. Pt reporting incontinent d/t unable to get to urinal in time then continued to lie in soiled brief. Discussed need to call for assistance even after accident. Pt completed bathing sitting unsupported EOB with supervision for sitting balance and pt demonstrating posterior lean at times however able to correct. Pt's HR 33-128 throughout therapy session with frequent rest breaks taken. Pt required min-mod assist for use of RUE to wash LUE and lower RLE. Completed dressing with mod cues for hemi dressing technique for UB dressing and min cues for carryover for LB dressing. Pt completed sit<>stand with min-mod assist and mod assist for standing balance with cues for weight shifting. Pt transferred to recliner chair at end of session with mod assist using RW. Pt left with all needs in reach.   Therapy Documentation Precautions:  Precautions Precautions: Fall Precaution Comments: Pusher tendencies, Right hemi, Right inattention Restrictions Weight Bearing  Restrictions: No General:   Vital Signs: Therapy Vitals Pulse Rate: 32 Patient Position, if appropriate: Sitting (post-transfer) Oxygen Therapy SpO2: 98 % O2 Device: None (Room air) Pain: Pain Assessment Pain Assessment: No/denies pain Pain Score: 0-No pain  See FIM for current functional status  Therapy/Group: Individual Therapy  Elva Mauro N Audrea Bolte 05/18/2013, 12:15 PM

## 2013-05-18 NOTE — Progress Notes (Signed)
Speech Language Pathology Daily Session Note  Patient Details  Name: TRINTEN BOUDOIN MRN: [id number] Date of Birth: 02-27-1965  Today's Date: 05/18/2013 Time: 3235-5732 Time Calculation (min): 35 min  Short Term Goals: Week 1: SLP Short Term Goal 1 (Week 1): Pt. will demonstrate appropriate problem solving abilities during complex activities wtih moderate verbal cues SLP Short Term Goal 2 (Week 1): Pt. will recall and utilize speech strategies in conversation with moderate verbal cues. SLP Short Term Goal 3 (Week 1): Pt. will utilize memory strategies to recall important information with moderate verbal cues  Skilled Therapeutic Interventions: Skilled treatment focused on speech and cognitive goals. SLP facilitated session with education regarding memory strategies. Pt quickly began to feel nauseous and warm, and was assisted back to his room where he required supervision level question cues to problem solve to use call bell for help. Pt verbally communicated his wants/needs with Min cues for speech intelligibility strategies at the conversational level. Pt was left in his room with RN to administer medication. Continue plan of care.   FIM:  Comprehension Comprehension Mode: Auditory Comprehension: 5-Follows basic conversation/direction: With no assist Expression Expression Mode: Verbal Expression: 4-Expresses basic 75 - 89% of the time/requires cueing 10 - 24% of the time. Needs helper to occlude trach/needs to repeat words. Social Interaction Social Interaction: 4-Interacts appropriately 75 - 89% of the time - Needs redirection for appropriate language or to initiate interaction. Problem Solving Problem Solving: 5-Solves basic problems: With no assist Memory Memory: 6-More than reasonable amt of time FIM - Eating Eating Activity: 6: Swallowing techniques: self-managed  Pain Pain Assessment Pain Assessment: No/denies pain  Therapy/Group: Individual Therapy   Germain Osgood,  M.A. CCC-SLP [phone]  Germain Osgood 05/18/2013, 5:15 PM

## 2013-05-19 ENCOUNTER — Inpatient Hospital Stay (HOSPITAL_COMMUNITY): Payer: BC Managed Care – PPO

## 2013-05-19 ENCOUNTER — Inpatient Hospital Stay (HOSPITAL_COMMUNITY): Payer: BC Managed Care – PPO | Admitting: Physical Therapy

## 2013-05-19 ENCOUNTER — Encounter (HOSPITAL_COMMUNITY): Payer: BC Managed Care – PPO

## 2013-05-19 LAB — URINE MICROSCOPIC-ADD ON

## 2013-05-19 LAB — BASIC METABOLIC PANEL
BUN: 16 mg/dL (ref 6–23)
CALCIUM: 8.9 mg/dL (ref 8.4–10.5)
CO2: 24 mEq/L (ref 19–32)
Chloride: 105 mEq/L (ref 96–112)
Creatinine, Ser: 1.17 mg/dL (ref 0.50–1.35)
GFR calc non Af Amer: 72 mL/min — ABNORMAL LOW (ref >90)
GFR, EST AFRICAN AMERICAN: 83 mL/min — AB (ref >90)
Glucose, Bld: 89 mg/dL (ref 70–99)
POTASSIUM: 3.7 meq/L (ref 3.7–5.3)
SODIUM: 142 meq/L (ref 137–147)

## 2013-05-19 LAB — URINALYSIS, ROUTINE W REFLEX MICROSCOPIC
Bilirubin Urine: NEGATIVE
Glucose, UA: NEGATIVE mg/dL
Hgb urine dipstick: NEGATIVE
Ketones, ur: NEGATIVE mg/dL
Leukocytes, UA: NEGATIVE
NITRITE: NEGATIVE
PROTEIN: 100 mg/dL — AB
SPECIFIC GRAVITY, URINE: 1.019 (ref 1.005–1.030)
Urobilinogen, UA: 1 mg/dL (ref 0.0–1.0)
pH: 6.5 (ref 5.0–8.0)

## 2013-05-19 MED ORDER — ALUM & MAG HYDROXIDE-SIMETH 200-200-20 MG/5ML PO SUSP
30.0000 mL | Freq: Four times a day (QID) | ORAL | Status: DC | PRN
  Administered 2013-05-19 – 2013-05-20 (×3): 30 mL via ORAL
  Filled 2013-05-19 (×3): qty 30

## 2013-05-19 MED ORDER — SENNOSIDES-DOCUSATE SODIUM 8.6-50 MG PO TABS
2.0000 | ORAL_TABLET | Freq: Every day | ORAL | Status: DC
  Administered 2013-05-19 – 2013-05-22 (×4): 2 via ORAL
  Filled 2013-05-19 (×5): qty 2

## 2013-05-19 NOTE — Progress Notes (Signed)
Physical Therapy Session Note  Patient Details  Name: Isaac Johnson MRN: [id number] Date of Birth: 03/16/1965  Today's Date: 05/19/2013 Time: 1100-1129 Time Calculation (min): 29 min  Short Term Goals: Week 1:  PT Short Term Goal 1 (Week 1): Pt will perform supine<>sit with supervision with HOB flat using bed rail. PT Short Term Goal 2 (Week 1): Pt will consistently perform bed<>chair transfer with min A. PT Short Term Goal 3 (Week 1): Pt will perform gait x50' in controlled environment with Total A of single therapist. PT Short Term Goal 4 (Week 1): Pt will negotiate 3 steps with single rail and Max A.  Skilled Therapeutic Interventions/Progress Updates:    Pt received seated in w/c. Pt states uncertainty as to whether or not he can participate in therapy secondary to "just feeling really bad," per patient. Per RN request to reposition pt in bed to prepare for pt to leave unit for diagnostic imaging, this PT assisted with functional mobility while RN monitored vital signs and pt response to activity/positional change. See vital signs for series of detailed readings with activity.  Performed sit<>stand from w/c with min A, L HHA. Static standing x40 seconds with L HHA requiring min guard, no LOB. Pt performed stand pivot from w/c>bed with min A, manual facilitation of bilat weight shifting. Seated on EOB, performed sit<>stand with min guard without UE support, per pt request to doff pants to increase comfort. Performed sit>supine with min A. Approximately 25 minutes required for pt to transition from seated in w/c>supine in bed secondary to prolonged rest breaks required between each positional change. PT and RN departed with pt semi-reclined in bed with all needs within reach.  Therapy Documentation Precautions:  Precautions Precautions: Fall Precaution Comments: Pusher tendencies, Right hemi, Right inattention Restrictions Weight Bearing Restrictions: No Vital Signs: Therapy Vitals Pulse  Rate: 99 (from 99 to 83 within 3 seconds of transitioning from sit>supine) Resp: 28 BP: 110/68 mmHg (RN checked BP manually) Patient Position, if appropriate: Lying Oxygen Therapy SpO2: 98 % O2 Device: None (Room air) Pain: Pain Assessment Pain Assessment: No/denies pain Pain Score: 0-No pain  See FIM for current functional status  Therapy/Group: Individual Therapy  Benjie Karvonen A Nyjah Schwake 05/19/2013, 11:36 AM

## 2013-05-19 NOTE — Progress Notes (Signed)
Subjective/Complaints: [name] with history of CHF, stomach ulcers, sciatica LLE, brain lipoma; who was admitted to [facility] on 05/06/13 with right side weakness. He was treated with tPA and transferred to [facility] for further treatment. CT head negative for acute changes. 2D echo with EF 25% with diffuse hypokinesis and grade 2 diastolic dysfunction. Carotid dopplers without significant ICA stenosis. Patient with improvement after tPA . 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 Estanislado Pandy. He was evaluated by Dr. Aundra Dubin who who recommends heart cath prior to discharge to rule out CAD as cause of CM and expressed concerns that CVA could be cardioembolic due to low EF. Medications adjusted and . He was started on ASA and plavix for secondary stroke prevention given stent in place. He has had high levels of anxiety with SOB (hyperventilating?) requiring ativan. CTA of chest negative for PE but with evidence of nodal prominence of questionable etiology.  No CP or SOB Dry heaves last noc, denies abd pain, +nausea, + constipation, feels if he could have BM, this would improve  Review of Systems - Negative except Right side weak  Objective: Vital Signs: Blood pressure 121/80, pulse 77, temperature 97 F (36.1 C), temperature source Oral, resp. rate 20, height 6' (1.829 m), SpO2 98.00%. No results found. Results for orders placed during the hospital encounter of 05/12/13 (from the past 72 hour(s))  COMPREHENSIVE METABOLIC PANEL     Status: Abnormal   Collection Time    05/17/13 10:58 AM      Result  Value Ref Range   Sodium 140  137 - 147 mEq/L   Potassium 4.1  3.7 - 5.3 mEq/L   Chloride 105  96 - 112 mEq/L   CO2 20  19 - 32 mEq/L   Glucose, Bld 109 (*) 70 - 99 mg/dL   BUN 16  6 - 23 mg/dL   Creatinine, Ser 1.25  0.50 - 1.35 mg/dL   Calcium 8.8  8.4 - 10.5 mg/dL   Total Protein 7.1  6.0 - 8.3 g/dL   Albumin 3.2 (*) 3.5 - 5.2 g/dL   AST 24  0 - 37 U/L   ALT 24  0 - 53 U/L   Alkaline Phosphatase 47  39 - 117 U/L   Total Bilirubin 0.9  0.3 - 1.2 mg/dL   GFR calc non Af Amer 66 (*) >90 mL/min   GFR calc Af Amer 77 (*) >90 mL/min   Comment: (NOTE)     The eGFR has been calculated using the CKD EPI equation.     This calculation has not been validated in all clinical situations.     eGFR's persistently <90 mL/min signify possible Chronic Kidney     Disease.  CBC     Status: None   Collection Time    05/17/13 10:58 AM      Result Value Ref Range   WBC 5.4  4.0 - 10.5 K/uL   RBC 4.63  4.22 - 5.81 MIL/uL   Hemoglobin 14.8  13.0 - 17.0 g/dL   HCT 43.2  39.0 - 52.0 %   MCV 93.3  78.0 - 100.0 fL  MCH 32.0  26.0 - 34.0 pg   MCHC 34.3  30.0 - 36.0 g/dL   RDW 13.7  11.5 - 15.5 %   Platelets 271  150 - 400 K/uL  PRO B NATRIURETIC PEPTIDE     Status: Abnormal   Collection Time    05/17/13  3:52 PM      Result Value Ref Range   Pro B Natriuretic peptide (BNP) 4808.0 (*) 0 - 125 pg/mL  BASIC METABOLIC PANEL     Status: Abnormal   Collection Time    05/19/13  4:35 AM      Result Value Ref Range   Sodium 142  137 - 147 mEq/L   Potassium 3.7  3.7 - 5.3 mEq/L   Chloride 105  96 - 112 mEq/L   CO2 24  19 - 32 mEq/L   Glucose, Bld 89  70 - 99 mg/dL   BUN 16  6 - 23 mg/dL   Creatinine, Ser 1.17  0.50 - 1.35 mg/dL   Calcium 8.9  8.4 - 10.5 mg/dL   GFR calc non Af Amer 72 (*) >90 mL/min   GFR calc Af Amer 83 (*) >90 mL/min   Comment: (NOTE)     The eGFR has been calculated using the CKD EPI equation.     This calculation has not been validated in all clinical situations.     eGFR's  persistently <90 mL/min signify possible Chronic Kidney     Disease.     HEENT: normal Cardio: RRR and no murmur Resp: CTA B/L and unlabored GI: BS positive and NT.ND Extremity:  Pulses positive and No Edema Skin:   Intact Musc/Skel:  Normal Right hemiparesis UE>LE. Mild sensory deficits on right side. Pt with right central 7 and tongue deviation. Speech dysarthric. Sensation intact to light touch in the upper and lower limbs. motor strength: RUE 3- delt,bi, 2- tri, grip. RLE 3- at HF,KE and KE/KF, 2-/5 at ankle   Assessment/Plan: 1. Functional deficits secondary to L MCA distribution infarct which require 3+ hours per day of interdisciplinary therapy in a comprehensive inpatient rehab setting. Physiatrist is providing close team supervision and 24 hour management of active medical problems listed below. Physiatrist and rehab team continue to assess barriers to discharge/monitor patient progress toward functional and medical goals.  FIM: FIM - Bathing Bathing Steps Patient Completed: Chest;Abdomen;Right upper leg;Left upper leg;Right Arm;Right lower leg (including foot);Left lower leg (including foot);Buttocks;Front perineal area Bathing: 4: Min-Patient completes 8-9 [id number] parts or 75+ percent  FIM - Upper Body Dressing/Undressing Upper body dressing/undressing steps patient completed: Put head through opening of pull over shirt/dress;Pull shirt over trunk;Thread/unthread left sleeve of pullover shirt/dress Upper body dressing/undressing: 4: Min-Patient completed 75 plus % of tasks FIM - Lower Body Dressing/Undressing Lower body dressing/undressing steps patient completed: Thread/unthread left underwear leg;Thread/unthread left pants leg;Pull underwear up/down Lower body dressing/undressing: 3: Mod-Patient completed 50-74% of tasks  FIM - Toileting Toileting steps completed by patient: Performs perineal hygiene Toileting Assistive Devices: Grab bar or rail for support Toileting: 2:  Max-Patient completed 1 of 3 steps  FIM - TRadio producerDevices: Grab bars Toilet Transfers: 4-To toilet/BSC: Min A (steadying Pt. > 75%)  FIM - Bed/Chair Transfer Bed/Chair Transfer Assistive Devices: Arm rests;Orthosis (R AFO ([place])) Bed/Chair Transfer: 4: Supine > Sit: Min A (steadying Pt. > 75%/lift 1 leg);4: Bed > Chair or W/C: Min A (steadying Pt. > 75%)  FIM - Locomotion: Wheelchair Distance: 65 Locomotion: Wheelchair: 2:  Travels 50 - 149 ft with minimal assistance (Pt.>75%) FIM - Locomotion: Ambulation Locomotion: Ambulation Assistive Devices: Orthosis;Other (comment) (L hallway hand rail; R AFO) Ambulation/Gait Assistance: 2: Max assist Locomotion: Ambulation: 1: Travels less than 50 ft with maximal assistance (Pt: 25 - 49%)  Comprehension Comprehension Mode: Auditory Comprehension: 5-Follows basic conversation/direction: With no assist  Expression Expression Mode: Verbal Expression: 4-Expresses basic 75 - 89% of the time/requires cueing 10 - 24% of the time. Needs helper to occlude trach/needs to repeat words.  Social Interaction Social Interaction: 4-Interacts appropriately 75 - 89% of the time - Needs redirection for appropriate language or to initiate interaction.  Problem Solving Problem Solving: 5-Solves basic problems: With no assist  Memory Memory: 6-More than reasonable amt of time  Medical Problem List and Plan:  1. 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 Status post revascularization with clot retrieval  2. DVT Prophylaxis/Anticoagulation: SCDs. Monitor for any signs of DVT  3. Pain Management: Tylenol as needed  4. Mood/anxiety: Xanax 0.25 mg twice a day, Ativan 1 mg every 6 as as needed anxiety. Bed alarm for safety  5. Neuropsych: This patient is capable of making decisions on his own behalf.  6. Dysphagia. Dysphagia 2 thin liquids. Followup speech therapy   7.Hypertension. Cozaar 25 mg daily, Lasix 20 mg daily, Aldactone 12.5 mg daily. Monitor with increased mobility  8. Cardiomyopathy/CHF. Monitor for any signs of fluid overload. Followup cardiology services. Plan left heart catheterization as outpatient per cardiology 9.  UTI- citrobacter sens Cefazolin, tx x 7d 10.  Nausea, doubt cardiac, started when abx started, will hold keflex , give laxative, PPI, check KUB LOS (Days) 7 A FACE TO FACE EVALUATION WAS PERFORMED  Charlett Blake 05/19/2013, 7:59 AM

## 2013-05-19 NOTE — Progress Notes (Addendum)
Physical Therapy Note  Patient Details  Name: Isaac Johnson MRN: [id number] Date of Birth: 1965/09/20 Today's Date: 05/19/2013  Pt missing 30 minutes of skilled physical therapy secondary to resting HR fluctuating from 94 to 47 bpm, resting RR 36 breaths/min.  Addendum: RN notified.  Per inquiry of pt's brother, provided education on the following: risks of increasing demand on pt's heart; risk of stroke given pt presentation and erratic resting heart rate. Pt/brother verbalized understanding.   Benjie Karvonen A Ameria Sanjurjo 05/19/2013, 3:38 PM

## 2013-05-19 NOTE — Progress Notes (Signed)
Subjective: + cough feeling nauseated at night- gagging though he did have a component of this prior to admit with anxiety., DOE only, though difficult to say at night, some rt arm numbness   Medications: I have reviewed the patient's current medications. Scheduled Meds: . aspirin  325 mg Oral Q breakfast  . carvedilol  3.125 mg Oral BID WC  . clopidogrel  75 mg Oral Q breakfast  . feeding supplement (ENSURE COMPLETE)  237 mL Oral BID BM  . furosemide  40 mg Oral Daily  . losartan  25 mg Oral Daily  . pantoprazole  80 mg Oral QHS  . polyethylene glycol  17 g Oral Daily  . potassium chloride  40 mEq Oral Daily  . senna-docusate  2 tablet Oral QHS  . spironolactone  12.5 mg Oral Daily   Continuous Infusions:  PRN Meds:.acetaminophen, ALPRAZolam, bisacodyl, ondansetron (ZOFRAN) IV, ondansetron, sorbitol, traMADol  Objective: Vital signs in last 24 hours: Temp:  [97 F (36.1 C)-98.6 F (37 C)] 97 F (36.1 C) (04/16 0508) Pulse Rate:  [77-103] 103 (04/16 0814) Resp:  [20-22] 20 (04/16 0508) BP: (121-134)/(77-95) 133/86 mmHg (04/16 0814) SpO2:  [96 %-100 %] 98 % (04/16 0508) Weight change:  Last BM Date: 05/18/13 (per report-per pt) Intake/Output from previous day: -2300 yesterday (-6785 since admit to rehab)  04/15 0701 - 04/16 0700 In: 600 [P.O.:600] Out: 2900 [Urine:2900] Intake/Output this shift: Total I/O In: 240 [P.O.:240] Out: -   PE: General:Pleasant affect, NAD Skin:Warm and dry, brisk capillary refill HEENT:normocephalic, sclera clear, mucus membranes moist Heart:S1S2 RRR without murmur, gallup, rub or click Lungs:without rales, + rhonchi on the rt, no wheezes ELF:YBOF, non tender, + BS, do not palpate liver spleen or masses Ext:no lower ext edema, 2+ pedal pulses, 2+ radial pulses, rt sided weakness   Neuro:alert and oriented, MAE, follows commands    Lab Results:  Recent Labs  05/17/13 1058  WBC 5.4  HGB 14.8  HCT 43.2  PLT 271   BMET  Recent  Labs  05/17/13 1058 05/19/13 0435  NA 140 142  K 4.1 3.7  CL 105 105  CO2 20 24  GLUCOSE 109* 89  BUN 16 16  CREATININE 1.25 1.17  CALCIUM 8.8 8.9   No results found for this basename: TROPONINI, CK, MB,  in the last 72 hours  Lab Results  Component Value Date   CHOL 124 05/06/2013   HDL 37* 05/06/2013   LDLCALC 74 05/06/2013   TRIG 63 05/06/2013   CHOLHDL 3.4 05/06/2013   Lab Results  Component Value Date   HGBA1C 5.8* 05/06/2013     Lab Results  Component Value Date   TSH 0.817 03/22/2008    Hepatic Function Panel  Recent Labs  05/17/13 1058  PROT 7.1  ALBUMIN 3.2*  AST 24  ALT 24  ALKPHOS 47  BILITOT 0.9   Studies/Results: Dg Abd 1 View  05/19/2013   CLINICAL DATA:  Nausea and constipation  EXAM: ABDOMEN - 1 VIEW  COMPARISON:  May 07, 2013  FINDINGS: There is moderate stool in the colon. The bowel gas pattern is unremarkable. No obstruction or free air is seen on this supine examination. There are phleboliths in the pelvis.  IMPRESSION: Bowel gas pattern unremarkable.  Moderate stool in colon.   Electronically Signed   By: Lowella Grip M.D.   On: 05/19/2013 08:37   EKG on the 11th with continued t wave inversion in inf lat leads. No acute changes except  on the 11th + PVCs.   Assessment/Plan: Active Problems:   Cardiomyopathy -EF 25% by 2D echo 05/2013- post rehab cardiac cath to rule out ischemia   Anxiety state, unspecified   CVA (cerebral infarction) - no on rehab   Bradycardia-BB at 3.125 BID, HR yesterday at 39 per VS record.  No symptoms.  Coreg held on the 14th, but has been given since, BP stable.  HR at times up to 103 only.  It would be great to have strips of HR in the 30s ? Big PVCs? Will order EKG if bradycardia.  Also nurses asking for parameters on coreg.   Acute systolic heart failure- Pro bnp 4808, though no previous to compare.  I am going to repeat CXR.   LOS: 7 days   Time spent with pt. :15 minutes. Cecilie Kicks  Nurse Practitioner  Certified Pager [phone] or after 5pm and on weekends call [phone] 05/19/2013, 10:18 AM  1. Acute systolic CHF - Lasix dose decreased to 40 mg po daily,  -2.3L/-6.7 overall, BNP 4808. Continue lasix at 40mg  daily and monitor I & O's.   2. CVA - SP TPA and left MCA revascularization with clot retrieval and stent placement in IR with Dr Estanislado Pandy. ASA, plavix - now on rehab   3. Cardiomyopathy - EF 25% with diffuse hypokinesis, grade 2 diastolic dysfunction, mildly dilated LV and normal wall thickness. Vascular congestion with mild pulmonary edema on earlier CXR. Abnormal EKG with possible inferior and lateral ischemia. Needs ischemic eval. Recommended left heart cath. Plan was to proceed post rehab. Currently on ASA and plavix., lasix 40 mg daily PO, spironolactone 12.5 mg daily.   4. HTN - controlled. Coreg 3.125mg  BID. Cozaar 25 mg daily. Continue current therapy.   5. Bradycardia and tachycardia at other times.  No telemetry on rehab. If more symptomatic bradycardia we would need to adjust meds. EKG today rate 80's diffuse TWI.   Dorothy Spark 05/19/2013

## 2013-05-19 NOTE — Progress Notes (Signed)
Occupational Therapy Session Note  Patient Details  Name: Isaac Johnson MRN: [id number] Date of Birth: [birth year]  Today's Date: 05/19/2013 Time: 0911-1006 Time Calculation (min): 55 min  Short Term Goals: Week 1:  OT Short Term Goal 1 (Week 1): Groom: Patient will perform 3/5 grooming tasks standing at sink with min assist OT Short Term Goal 2 (Week 1): Bath:  Min assist to include sit and stand. OT Short Term Goal 3 (Week 1): UB Dressing: Min assist in unsupported sitting OT Short Term Goal 4 (Week 1): LB Dressing:  Mod Assist to include sit and stand OT Short Term Goal 5 (Week 1): RUE:  Patient will position and monitor RUE at all times during BADL and functional mobility with no more than 4 vcs per session  Skilled Therapeutic Interventions/Progress Updates:    Pt seen for ADL retraining with focus on activity tolerance, functional transfers, and hemi dressing techniques. Spoke with RN and therapy supervisor prior to session about concerns of pt's vitals as there were no set parameters to follow in orders. Monitored HR closely throughout session. Pt received supine in bed requesting to completed toilet task. Completed stand pivot transfers bed>w/c and w/c<>toilet at min assist. Pt's HR 98-102 after transfer then dropping to 72-78 within seconds when sitting on toilet. Mild SOB noted and HR fluctuating 70s-90s while on toilet. Allowed rest break and HR to become stable before beginning sponge bath at sink. HR 80s-90s throughout bathing however began fluctuating during dressing. HR changing 104-69 during UB and LB dressing with frequent rest breaks. HR would change dramatically within seconds. Terminated therapy session and pt requesting to remain in w/c as linens on bed had not been changed yet. Pt with no SOB upon leaving room and HR 86-89 for 1 min. All needs in reach. SpO2>98% throughout session.  Therapy Documentation Precautions:  Precautions Precautions: Fall Precaution Comments: Pusher  tendencies, Right hemi, Right inattention Restrictions Weight Bearing Restrictions: No General:   Vital Signs: Therapy Vitals Pulse Rate: 99 (from 99 to 83 within 3 seconds of transitioning from sit>supine) Resp: 28 BP: 110/68 mmHg (RN checked BP manually) Patient Position, if appropriate: Lying Oxygen Therapy SpO2: 98 % O2 Device: None (Room air) Pain: Pain Assessment Pain Assessment: No/denies pain Pain Score: 0-No pain  See FIM for current functional status  Therapy/Group: Individual Therapy  Jacquita Mulhearn N Kamera Dubas 05/19/2013, 12:18 PM

## 2013-05-19 NOTE — Progress Notes (Signed)
Speech Language Pathology Daily Session Note  Patient Details  Name: Isaac Johnson MRN: [id number] Date of Birth: 05-24-1965  Today's Date: 05/19/2013 Time: 1015-1055 Time Calculation (min): 40 min  Short Term Goals: Week 1: SLP Short Term Goal 1 (Week 1): Pt. will demonstrate appropriate problem solving abilities during complex activities wtih moderate verbal cues SLP Short Term Goal 2 (Week 1): Pt. will recall and utilize speech strategies in conversation with moderate verbal cues. SLP Short Term Goal 3 (Week 1): Pt. will utilize memory strategies to recall important information with moderate verbal cues  Skilled Therapeutic Interventions: Skilled treatment focused on speech, swallowing, and cognitive goals. SLP faciltiated session with Min cues to utilize external aid for recall of speech intelligibility strategies. Pt utilized strategies at the conversational level with supervision level verbal cueing. Pt consumed limited trials of Dys 3 textures and thin liquids, utilizing swallowing strategies to clear buccal pocketing with Mod I. Pt was coughing and dry heaving frequently throughout session (RN and PAs made aware), although no coughing was observed during PO intake. Discussed with PA possibility of potential esophageal component. Continue plan of care.   FIM:  Comprehension Comprehension Mode: Auditory Comprehension: 5-Follows basic conversation/direction: With no assist Expression Expression Mode: Verbal Expression: 5-Expresses basic 90% of the time/requires cueing < 10% of the time. Social Interaction Social Interaction: 4-Interacts appropriately 75 - 89% of the time - Needs redirection for appropriate language or to initiate interaction. Problem Solving Problem Solving: 5-Solves basic problems: With no assist Memory Memory: 4-Recognizes or recalls 75 - 89% of the time/requires cueing 10 - 24% of the time FIM - Eating Eating Activity: 6: Swallowing techniques: self-managed;5:  Set-up assist for open containers  Pain Pain Assessment Pain Assessment: No/denies pain Pain Score: 0-No pain  Therapy/Group: Individual Therapy   Germain Osgood, M.A. CCC-SLP [phone]  Germain Osgood 05/19/2013, 2:49 PM

## 2013-05-20 ENCOUNTER — Inpatient Hospital Stay (HOSPITAL_COMMUNITY): Payer: BC Managed Care – PPO | Admitting: *Deleted

## 2013-05-20 ENCOUNTER — Inpatient Hospital Stay (HOSPITAL_COMMUNITY): Payer: BC Managed Care – PPO

## 2013-05-20 ENCOUNTER — Inpatient Hospital Stay (HOSPITAL_COMMUNITY): Payer: BC Managed Care – PPO | Admitting: Physical Therapy

## 2013-05-20 DIAGNOSIS — G8190 Hemiplegia, unspecified affecting unspecified side: Secondary | ICD-10-CM | POA: Diagnosis not present

## 2013-05-20 DIAGNOSIS — I6350 Cerebral infarction due to unspecified occlusion or stenosis of unspecified cerebral artery: Secondary | ICD-10-CM

## 2013-05-20 DIAGNOSIS — I69991 Dysphagia following unspecified cerebrovascular disease: Secondary | ICD-10-CM

## 2013-05-20 DIAGNOSIS — I69959 Hemiplegia and hemiparesis following unspecified cerebrovascular disease affecting unspecified side: Secondary | ICD-10-CM

## 2013-05-20 DIAGNOSIS — I69922 Dysarthria following unspecified cerebrovascular disease: Secondary | ICD-10-CM

## 2013-05-20 DIAGNOSIS — I6340 Cerebral infarction due to embolism of unspecified cerebral artery: Secondary | ICD-10-CM | POA: Diagnosis not present

## 2013-05-20 DIAGNOSIS — Z5189 Encounter for other specified aftercare: Secondary | ICD-10-CM | POA: Diagnosis not present

## 2013-05-20 DIAGNOSIS — I5021 Acute systolic (congestive) heart failure: Secondary | ICD-10-CM | POA: Diagnosis not present

## 2013-05-20 LAB — BASIC METABOLIC PANEL
BUN: 12 mg/dL (ref 6–23)
CALCIUM: 9.3 mg/dL (ref 8.4–10.5)
CO2: 24 mEq/L (ref 19–32)
Chloride: 104 mEq/L (ref 96–112)
Creatinine, Ser: 1.13 mg/dL (ref 0.50–1.35)
GFR calc Af Amer: 87 mL/min — ABNORMAL LOW (ref >90)
GFR, EST NON AFRICAN AMERICAN: 75 mL/min — AB (ref >90)
GLUCOSE: 103 mg/dL — AB (ref 70–99)
Potassium: 4.5 mEq/L (ref 3.7–5.3)
SODIUM: 140 meq/L (ref 137–147)

## 2013-05-20 LAB — PRO B NATRIURETIC PEPTIDE: PRO B NATRI PEPTIDE: 2735 pg/mL — AB (ref 0–125)

## 2013-05-20 MED ORDER — ASPIRIN 81 MG PO CHEW: 81.0000 mg | CHEWABLE_TABLET | ORAL | Status: AC

## 2013-05-20 MED ORDER — SUCRALFATE 1 GM/10ML PO SUSP
1.0000 g | Freq: Two times a day (BID) | ORAL | Status: DC
  Administered 2013-05-20 – 2013-05-22 (×6): 1 g via ORAL
  Filled 2013-05-20 (×9): qty 10

## 2013-05-20 MED ORDER — ONDANSETRON HCL 8 MG PO TABS
8.0000 mg | ORAL_TABLET | Freq: Two times a day (BID) | ORAL | Status: DC
  Administered 2013-05-20 – 2013-05-22 (×6): 8 mg via ORAL
  Filled 2013-05-20 (×2): qty 1
  Filled 2013-05-20: qty 2
  Filled 2013-05-20 (×3): qty 1
  Filled 2013-05-20: qty 2
  Filled 2013-05-20 (×3): qty 1
  Filled 2013-05-20 (×3): qty 2
  Filled 2013-05-20: qty 1

## 2013-05-20 MED ORDER — BENZONATATE 100 MG PO CAPS
100.0000 mg | ORAL_CAPSULE | Freq: Three times a day (TID) | ORAL | Status: DC
  Administered 2013-05-20 – 2013-05-22 (×9): 100 mg via ORAL
  Filled 2013-05-20 (×13): qty 1

## 2013-05-20 MED ORDER — SODIUM CHLORIDE 0.9 % IJ SOLN: 3.0000 mL | INTRAMUSCULAR | Status: DC | PRN

## 2013-05-20 MED ORDER — SODIUM CHLORIDE 0.9 % IJ SOLN: 3.0000 mL | Freq: Two times a day (BID) | INTRAMUSCULAR | Status: DC

## 2013-05-20 MED ORDER — SODIUM CHLORIDE 0.9 % IV SOLN: 250.0000 mL | INTRAVENOUS | Status: DC | PRN

## 2013-05-20 NOTE — Progress Notes (Signed)
Occupational Therapy Session Note  Patient Details  Name: Isaac Johnson MRN: [id number] Date of Birth: [birth year]  Today's Date: 05/20/2013 Time: 0900-1010 Time Calculation (min): 70 min  Short Term Goals: Week 1:  OT Short Term Goal 1 (Week 1): Groom: Patient will perform 3/5 grooming tasks standing at sink with min assist OT Short Term Goal 2 (Week 1): Bath:  Min assist to include sit and stand. OT Short Term Goal 3 (Week 1): UB Dressing: Min assist in unsupported sitting OT Short Term Goal 4 (Week 1): LB Dressing:  Mod Assist to include sit and stand OT Short Term Goal 5 (Week 1): RUE:  Patient will position and monitor RUE at all times during BADL and functional mobility with no more than 4 vcs per session Week 2:     Skilled Therapeutic Interventions/Progress Updates:    Engaged in therapeutic bathing and dressing at sink level.  HR during session 52-91  Supine with HOB up; 47-82 HR After bathing; BP 140/85; after donning socks HR= 53-84;    Pt. tolerataed session with frequent rest breaks.  Did sit to stand with min to SBA.  Left pt in room with next therapy.    Therapy Documentation Precautions:  Precautions Precautions: Fall Precaution Comments: Pusher tendencies, Right hemi, Right inattention Restrictions Weight Bearing Restrictions: No General:   Vital Signs: Therapy Vitals Pulse Rate: 85 (flucuates between 85-103) Resp: 28 BP: 140/85 mmHg Patient Position, if appropriate: Lying Oxygen Therapy SpO2: 98 % O2 Device: None (Room air) Pain:  none   ADL:   Exercises:   Other Treatments:    See FIM for current functional status  Therapy/Group: Individual Therapy  Lisa Roca 05/20/2013, 10:17 AM

## 2013-05-20 NOTE — Progress Notes (Signed)
Subjective: SOB has improved, no CP.  Medications:  . aspirin  325 mg Oral Q breakfast  . carvedilol  3.125 mg Oral BID WC  . clopidogrel  75 mg Oral Q breakfast  . feeding supplement (ENSURE COMPLETE)  237 mL Oral BID BM  . furosemide  40 mg Oral Daily  . losartan  25 mg Oral Daily  . pantoprazole  80 mg Oral QHS  . polyethylene glycol  17 g Oral Daily  . potassium chloride  40 mEq Oral Daily  . senna-docusate  2 tablet Oral QHS  . spironolactone  12.5 mg Oral Daily   Continuous Infusions:  PRN Meds:.acetaminophen, ALPRAZolam, bisacodyl, ondansetron (ZOFRAN) IV, ondansetron, sorbitol, traMADol  Objective: Vital signs in last 24 hours: Temp:  [97 F (36.1 C)-98.3 F (36.8 C)] 97.3 F (36.3 C) (04/17 0600) Pulse Rate:  [33-116] 92 (04/17 1329) Resp:  [20-36] 32 (04/17 1024) BP: (117-148)/(68-104) 117/85 mmHg (04/17 0815) SpO2:  [91 %-100 %] 98 % (04/17 1329) Weight change:  Last BM Date: 05/19/13 Intake/Output from previous day: -1 L yesterday (-7.7 L since admit to rehab)  04/16 0701 - 04/17 0700 In: 480 [P.O.:480] Out: 1400 [Urine:1400]  PE: General:Pleasant affect, NAD Skin:Warm and dry, brisk capillary refill HEENT:normocephalic, sclera clear, mucus membranes moist Heart:S1S2 RRR without murmur, gallup, rub or click Lungs:without rales, + rhonchi on the rt, no wheezes PYK:DXIP, non tender, + BS, do not palpate liver spleen or masses Ext:no lower ext edema, 2+ pedal pulses, 2+ radial pulses, rt sided weakness   Neuro:alert and oriented, MAE, follows commands    BMET  Recent Labs  05/19/13 0435 05/20/13 0714  NA 142 140  K 3.7 4.5  CL 105 104  CO2 24 24  GLUCOSE 89 103*  BUN 16 12  CREATININE 1.17 1.13  CALCIUM 8.9 9.3   No results found for this basename: TROPONINI, CK, MB,  in the last 72 hours  Lab Results  Component Value Date   CHOL 124 05/06/2013   HDL 37* 05/06/2013   LDLCALC 74 05/06/2013   TRIG 63 05/06/2013   CHOLHDL 3.4 05/06/2013    Studies/Results: Dg Chest 2 View  05/19/2013   CLINICAL DATA:  Shortness of breath, weakness  EXAM: CHEST  2 VIEW  COMPARISON:  05/14/2013  FINDINGS: Cardiomegaly is noted. No acute infiltrate or pleural effusion. No pulmonary edema. Bony thorax is unremarkable.  IMPRESSION: No active cardiopulmonary disease.   Electronically Signed   By: Lahoma Crocker M.D.   On: 05/19/2013 14:31   EKG on the 11th with continued t wave inversion in inf lat leads. No acute changes except on the 11th + PVCs.    Assessment/Plan:  Active Problems:   Cardiomyopathy -EF 25% by 2D echo 05/2013- post rehab cardiac cath to rule out ischemia   Anxiety state, unspecified   CVA (cerebral infarction) - no on rehab   Bradycardia-BB at 3.125 BID, HR yesterday at 39 per VS record.  No symptoms.  Coreg held on the 14th, but has been given since, BP stable.  HR at times up to 103 only.  It would be great to have strips of HR in the 30s ? Big PVCs? Will order EKG if bradycardia.  Also nurses asking for parameters on coreg.   Acute systolic heart failure- Pro bnp 4808, though no previous to compare.  I am going to repeat CXR.   1. Acute systolic CHF - Lasix dose decreased to 40 mg po daily, still good diuresis  with improving symptoms, he appears euvolemic, -1L/-7.7 overall, BNP 4808. Continue lasix at 40mg  daily and monitor I & O's.   2. CVA - SP TPA and left MCA revascularization with clot retrieval and stent placement in IR with Dr Estanislado Pandy. ASA, plavix - now on rehab   3. Cardiomyopathy - EF 25% with diffuse hypokinesis, grade 2 diastolic dysfunction, mildly dilated LV and normal wall thickness. Vascular congestion with mild pulmonary edema on earlier CXR, now resolved. Abnormal EKG with possible inferior and lateral ischemia. Needs ischemic eval. We will plan for a left and right cath on Monday. Currently on ASA and plavix., lasix 40 mg daily PO, spironolactone 12.5 mg daily.   4. HTN - controlled. Coreg 3.125mg  BID. Cozaar  25 mg daily. Continue current therapy.   5. Bradycardia and tachycardia at other times.  No telemetry on rehab. If more symptomatic bradycardia we would need to adjust meds. EKG today rate 80's diffuse TWI.   Dorothy Spark 05/20/2013

## 2013-05-20 NOTE — Progress Notes (Signed)
Speech Language Pathology Weekly Progress and Session Notes  Patient Details  Name: Isaac Johnson MRN: [id number] Date of Birth: 1965-03-21  Beginning of progress report period: May 13, 2013 End of progress report period: May 20, 2013  Today's Date: 05/20/2013 Time: 1125-1210 Time Calculation (min): 45 min  Short Term Goals: Week 1: SLP Short Term Goal 1 (Week 1): Pt. will demonstrate appropriate problem solving abilities during complex activities wtih moderate verbal cues SLP Short Term Goal 1 - Progress (Week 1): Progressing toward goal SLP Short Term Goal 2 (Week 1): Pt. will recall and utilize speech strategies in conversation with moderate verbal cues. SLP Short Term Goal 2 - Progress (Week 1): Met SLP Short Term Goal 3 (Week 1): Pt. will utilize memory strategies to recall important information with moderate verbal cues SLP Short Term Goal 3 - Progress (Week 1): Progressing toward goal SLP Short Term Goal 4 (Week 1): Pt will demonstrate adequate mastication and oral clearance wtih regular textures wtih Min cues SLP Short Term Goal 4 - Progress (Week 1): Not met    New Short Term Goals: Week 2: SLP Short Term Goal 1 (Week 2): Pt. will demonstrate appropriate problem solving abilities during complex activities wtih moderate verbal cues SLP Short Term Goal 2 (Week 2): Pt. will recall and utilize speech strategies in conversation with supervision verbal cues. SLP Short Term Goal 3 (Week 2): Pt. will utilize memory strategies to recall important information with min verbal cues SLP Short Term Goal 4 (Week 2): Pt will demonstrate adequate mastication and oral clearance wtih regular textures wtih Min cues  Weekly Progress Updates: Pt has met 1 out of 4 STGs during this reporting period due to increased use of speech intelligibility strategies at the conversational level. Pt's progress has been impacted by his participation level due to frequent coughing/dry heaving. Therefore, pt has  made inconsistent gains with use of memory strategies and problem solving. He continues to tolerate Dys 3 textures and thin liquids with intermittent supervision. Education is ongoing. Pt will benefit from continued SLP services to maximize functional communication, swallowing safety, and cognitive function prior to discharge home.   Intensity: Minumum of 1-2 x/day, 30 to 90 minutes Frequency: 5 out of 7 days Duration/Length of Stay: 2 weeks Treatment/Interventions: Cognitive remediation/compensation;Patient/family education;Functional tasks;Dysphagia/aspiration precaution training   Daily Session Skilled Therapeutic Interventions: Skilled treatment focused on cognitive goals. SLP facilitated session with education regarding memory strategies. Pt participated in new learning task with Mod faded to Min cues for working memory for recall of instructions. Pt sustained attention to task for ~30 minutes without cues for redirection.        FIM:  Comprehension Comprehension Mode: Auditory Comprehension: 5-Follows basic conversation/direction: With no assist Expression Expression Mode: Verbal Expression: 5-Expresses basic 90% of the time/requires cueing < 10% of the time. Social Interaction Social Interaction: 5-Interacts appropriately 90% of the time - Needs monitoring or encouragement for participation or interaction. Problem Solving Problem Solving: 5-Solves basic problems: With no assist Memory Memory: 4-Recognizes or recalls 75 - 89% of the time/requires cueing 10 - 24% of the time General    Pain    Therapy/Group: Individual Therapy   Germain Osgood, M.A. CCC-SLP [phone]  Germain Osgood 05/20/2013, 4:16 PM

## 2013-05-20 NOTE — Progress Notes (Signed)
Physical Therapy Weekly Progress Note  Patient Details  Name: Isaac Johnson MRN: [id number] Date of Birth: [birth year]  Beginning of progress report period: May 13, 2013 End of progress report period: May 20, 2013  Today's Date: 05/20/2013 Time: 4665-9935 and 1300-1330 Time Calculation (min): 53 min and 30 min  Patient has met 2 of 4 short term goals. Pt has demonstrated improved independence with bed mobility and functional transfers. Pt has not met short-term goals addressing gait and stair negotiation, as participation in physical therapy has been limited by heart rate (resting and HR response to activity) and pt SOB. Pt was able to attempt gait during PM session; however, HR did fluctuate following short-distance gait trial; see below for details.  Patient continues to demonstrate the following deficits: muscle weakness, decreased cardiorespiratoy endurance, impaired timing and sequencing, unbalanced muscle activation and decreased visual perceptual skills, decreased standing balance, hemiplegia and decreased balance strategies and therefore will continue to benefit from skilled PT intervention to enhance overall performance with activity tolerance, balance, postural control, ability to compensate for deficits and functional use of  right upper extremity and right lower extremity.  Patient progressing toward long term goals..  Continue plan of care.  PT Short Term Goals Week 1:  PT Short Term Goal 1 (Week 1): Pt will perform supine<>sit with supervision with HOB flat using bed rail. PT Short Term Goal 1 - Progress (Week 1): Met PT Short Term Goal 2 (Week 1): Pt will consistently perform bed<>chair transfer with min A. PT Short Term Goal 2 - Progress (Week 1): Met PT Short Term Goal 3 (Week 1): Pt will perform gait x50' in controlled environment with Total A of single therapist. PT Short Term Goal 3 - Progress (Week 1): Progressing toward goal PT Short Term Goal 4 (Week 1): Pt will  negotiate 3 steps with single rail and Max A. PT Short Term Goal 4 - Progress (Week 1): Not met Week 2:  PT Short Term Goal 1 (Week 2): Pt will perform bed<>chair with close supervision to min guard. PT Short Term Goal 2 (Week 2): Pt will perform gait x50' in controlled environment with +1Total A. PT Short Term Goal 3 (Week 2): Pt will perform w/c mobility x100' in controlled environment with supervision. PT Short Term Goal 4 (Week 2): Pt will negotiate 3 steps with single hand rail and max A.  Skilled Therapeutic Interventions/Progress Updates:    Treatment Session 1:  Pt received seated in w/c; agreeable to session. Pt noted to be sweating and breathing rapidly. Resting HR fluctuating between 43 and 96 bpm and noted to be irregular on palpation. Notified RN and PA of resting HR and symptoms. Performed squat pivot transfer form w/c>bed with min A, where EKG was performed. Post EKG, PA notified this therapist that pt may proceed with physical therapy as tolerated.   Performed remainder of session with frequent seated rest breaks, resuming when vital signs were stable/WNL and pt was asymptomatic. See vital signs for detailed readings. Seated EOB, pt performed RLE NMR. See below for detailed description of NMR. Performed squat pivot transfer from bed>w/c with min A. During NMR, pt with increased frustration due to limited RLE function. Pt with tearful episode, describing discouragement secondary to inability to participate in therapies due to ongoing medical issues. Therapist provided max encouragement, reminding pt of functional gains made thus far. Therapist departed with pt seated in w/c with all needs within reach. RN notified of session.  Treatment Session 2: Pt received  seated in w/c with brother present. Pt reports feeling "better" and is agreeable to session. Resting HR 85 without fluctuation. Attempted sit>stand from w/c>rolling walker, then static standing x30 seconds with bilat UE support at  rolling walker with R hand orthosis and min A; no change in HR or SpO2; pt asymptomatic. In seated, donned R AFO for dorsiflexion assist and proximal RLE stability; added 3/8" heel lift to prevent R genu recurvatum. Performed gait x15' in controlled environment with rolling walker, R hand orthosis requiring max A, manual facilitation of weight shifting, multimodal cueing for upright posture (focus on R gluteus maximus activation). Following short-distance gait trial, HR 33 bpm and SpO2 91%; pt asymptomatic. Pt repositioned into seated in w/c. Within 30 seconds, HR returned to 85 (without fluctuation) and SpO2 98%. After seated rest break, performed final sit<>stand from w/c with min guard; static standing x60 seconds without UE support requiring close supervision for stability/balance. Vitals remained WNL and pt asymptomatic. Therapist departed with pt seated in w/c with brother present, pt in no apparent distress, and all needs within reach.  Therapy Documentation Precautions:  Precautions Precautions: Fall Precaution Comments: Pusher tendencies, Right hemi, Right inattention Restrictions Weight Bearing Restrictions: No General: Amount of Missed PT Time (min): 7 Minutes Missed Time Reason: Other (comment) (EKG) Vital Signs: Therapy Vitals Pulse Rate: 47 Resp: 32 Patient Position, if appropriate: Sitting Oxygen Therapy SpO2: 98 % O2 Device: None (Room air) Pain: Pain Assessment Pain Assessment: No/denies pain Pain Score: 0-No pain NMR:   Seated EOB, pt performed the following RLE NMR to promote activation, isolated control of LLE movement to facilitate normalized gait: rhythmic initiation of D2 flexion/extension with focus on isolation of R ankle dorsiflexion with concurrent R knee extension. Contraction of R ankle dorsiflexors palpable but no movement through ROM noted.    See FIM for current functional status  Therapy/Group: Individual Therapy  Malva Cogan Hobble 05/20/2013, 12:35 PM

## 2013-05-20 NOTE — Progress Notes (Signed)
INITIAL NUTRITION ASSESSMENT  DOCUMENTATION CODES Per approved criteria  -Not Applicable   INTERVENTION: 1.  Supplements; Ensure Complete po BID, each supplement provides 350 kcal and 13 grams of protein 2.  General healthful diet; encourage intake of foods and beverages as able.  RD to follow and assess for nutritional adequacy.   NUTRITION DIAGNOSIS: Inadequate oral intake related to weakness, fatigue as evidenced by PO variable.   Monitor:  1.  Food/Beverage; pt meeting >/=90% estimated needs with tolerance. 2.  Wt/wt change; monitor trends  Reason for Assessment: MST  48 y.o. 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 Pt sleeping soundly at time of visit.  Family at bedside state pt has had a "hard day."  Pt did consume 100% of his lunch today, however his intake overall has been variable.  RD notes moderate-severe wasting at temples and severe wasting at clavicles.  Pt would benefit from complete nutrition focused physical exam when able.  Pt has experienced wt loss in the remote past (10-15 lbs), however little wt change recently.  RD to follow.   Height: Ht Readings from Last 1 Encounters:  05/12/13 6' (1.829 m)    Weight: Wt Readings from Last 1 Encounters:  05/09/13 170 lb 6.7 oz (77.3 kg)    Ideal Body Weight: 80.9 kg  % Ideal Body Weight: 95%  Wt Readings from Last 10 Encounters:  05/09/13 170 lb 6.7 oz (77.3 kg)  05/09/13 170 lb 6.7 oz (77.3 kg)  06/23/12 168 lb 7 oz (76.403 kg)  07/08/11 185 lb (83.915 kg)  10/03/08 183 lb (83.008 kg)  04/25/08 183 lb (83.008 kg)  03/28/08 185 lb (83.915 kg)  02/25/08 185 lb (83.915 kg)  12/11/06 176 lb (79.833 kg)  09/11/06 183 lb (83.008 kg)     Usual Body Weight: 92%  % Usual Body Weight: 180-185 lbs  BMI:  There is no weight on file to calculate BMI.  Estimated Nutritional Needs: Kcal: 2160-2300 Protein: 100-115g Fluid: ~2.2 L/day  Skin: intact  Diet Order: Dysphagia 3, thin  EDUCATION NEEDS: -Education needs addressed   Intake/Output Summary (Last 24 hours) at 05/20/13 1447 Last data filed at 05/20/13 1300  Gross per 24 hour  Intake    480 ml  Output   1450 ml  Net   -970 ml    Last BM: 4/16  Labs:   Recent Labs Lab 05/17/13 1058 05/19/13 0435 05/20/13 0714  NA 140 142 140  K 4.1 3.7 4.5  CL 105 105 104  CO2 20 24 24   BUN 16 16 12   CREATININE 1.25 1.17 1.13  CALCIUM 8.8 8.9 9.3  GLUCOSE 109* 89 103*    CBG (last 3)  No results found for this basename: GLUCAP,  in the last 72 hours  Scheduled Meds: . [START ON 05/21/2013] aspirin  81 mg Oral Pre-Cath  . aspirin  325 mg Oral Q breakfast  . benzonatate  100 mg Oral TID  . carvedilol  3.125 mg Oral BID WC  . clopidogrel  75 mg Oral Q breakfast  . feeding supplement (ENSURE COMPLETE)  237 mL Oral BID BM  . furosemide  40 mg Oral Daily  . losartan  25 mg Oral Daily  . ondansetron  8 mg Oral  Q12H  . pantoprazole  80 mg Oral QHS  . polyethylene glycol  17 g Oral Daily  . potassium chloride  40 mEq Oral Daily  . senna-docusate  2 tablet Oral QHS  . sodium chloride  3 mL Intravenous Q12H  . spironolactone  12.5 mg Oral Daily  . sucralfate  1 g Oral BID    Continuous Infusions:   Past Medical History  Diagnosis Date  . Hypertension   . Stomach ulcer   . Gait instability   . Proteinuria   . GERD (gastroesophageal reflux disease)   . Insomnia   . Dizziness   . Leukopenia   . Sciatica     left  . Brain lipoma 2010    Dr. Trenton Gammon  . CHF (congestive heart failure)   . Acute systolic heart failure     Past Surgical History  Procedure Laterality Date  . Radiology with anesthesia N/A 05/07/2013    Procedure: RADIOLOGY WITH  ANESTHESIA;  Surgeon: Rob Hickman, MD;  Location: [facility];  Service: Radiology;  Laterality: N/A;    Brynda Greathouse, MS RD LDN Clinical Inpatient Dietitian Pager: [phone] Weekend/After hours pager: [phone]

## 2013-05-20 NOTE — Progress Notes (Signed)
Subjective/Complaints: [name] with history of CHF, stomach ulcers, sciatica LLE, brain lipoma; who was admitted to [facility] on 05/06/13 with right side weakness. He was treated with tPA and transferred to [facility] for further treatment. CT head negative for acute changes. 2D echo with EF 25% with diffuse hypokinesis and grade 2 diastolic dysfunction. Carotid dopplers without significant ICA stenosis. Patient with improvement after tPA . 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 Estanislado Pandy. He was evaluated by Dr. Aundra Dubin who who recommends heart cath prior to discharge to rule out CAD as cause of CM and expressed concerns that CVA could be cardioembolic due to low EF. Medications adjusted and . He was started on ASA and plavix for secondary stroke prevention given stent in place. He has had high levels of anxiety with SOB (hyperventilating?) requiring ativan. CTA of chest negative for PE but with evidence of nodal prominence of questionable etiology.  Still having "dry heaves"--no abdominal pain, persistent nausea Review of Systems - Negative except Right side weak  Objective: Vital Signs: Blood pressure 117/85, pulse 85, temperature 97.3 F (36.3 C), temperature source Oral, resp. rate 28, height 6' (1.829 m), SpO2 98.00%. Dg Chest 2 View  05/19/2013   CLINICAL DATA:  Shortness of breath, weakness  EXAM: CHEST  2 VIEW  COMPARISON:  05/14/2013  FINDINGS: Cardiomegaly is noted. No acute infiltrate or pleural effusion. No pulmonary edema. Bony thorax is unremarkable.  IMPRESSION: No active  cardiopulmonary disease.   Electronically Signed   By: Lahoma Crocker M.D.   On: 05/19/2013 14:31   Dg Abd 1 View  05/19/2013   CLINICAL DATA:  Nausea and constipation  EXAM: ABDOMEN - 1 VIEW  COMPARISON:  May 07, 2013  FINDINGS: There is moderate stool in the colon. The bowel gas pattern is unremarkable. No obstruction or free air is seen on this supine examination. There are phleboliths in the pelvis.  IMPRESSION: Bowel gas pattern unremarkable.  Moderate stool in colon.   Electronically Signed   By: Lowella Grip M.D.   On: 05/19/2013 08:37   Results for orders placed during the hospital encounter of 05/12/13 (from the past 72 hour(s))  COMPREHENSIVE METABOLIC PANEL     Status: Abnormal   Collection Time    05/17/13 10:58 AM      Result Value Ref Range   Sodium 140  137 - 147 mEq/L   Potassium 4.1  3.7 - 5.3 mEq/L   Chloride 105  96 - 112 mEq/L   CO2 20  19 - 32 mEq/L   Glucose, Bld 109 (*) 70 - 99 mg/dL   BUN 16  6 - 23 mg/dL   Creatinine, Ser 1.25  0.50 - 1.35 mg/dL   Calcium 8.8  8.4 - 10.5 mg/dL   Total Protein 7.1  6.0 - 8.3 g/dL   Albumin 3.2 (*) 3.5 - 5.2 g/dL   AST 24  0 - 37 U/L   ALT 24  0 - 53 U/L   Alkaline Phosphatase 47  39 - 117 U/L   Total Bilirubin 0.9  0.3 - 1.2 mg/dL   GFR calc non Af Wyvonnia Lora  66 (*) >90 mL/min   GFR calc Af Amer 77 (*) >90 mL/min   Comment: (NOTE)     The eGFR has been calculated using the CKD EPI equation.     This calculation has not been validated in all clinical situations.     eGFR's persistently <90 mL/min signify possible Chronic Kidney     Disease.  CBC     Status: None   Collection Time    05/17/13 10:58 AM      Result Value Ref Range   WBC 5.4  4.0 - 10.5 K/uL   RBC 4.63  4.22 - 5.81 MIL/uL   Hemoglobin 14.8  13.0 - 17.0 g/dL   HCT 43.2  39.0 - 52.0 %   MCV 93.3  78.0 - 100.0 fL   MCH 32.0  26.0 - 34.0 pg   MCHC 34.3  30.0 - 36.0 g/dL   RDW 13.7  11.5 - 15.5 %   Platelets 271  150 - 400 K/uL  PRO B NATRIURETIC PEPTIDE      Status: Abnormal   Collection Time    05/17/13  3:52 PM      Result Value Ref Range   Pro B Natriuretic peptide (BNP) 4808.0 (*) 0 - 125 pg/mL  BASIC METABOLIC PANEL     Status: Abnormal   Collection Time    05/19/13  4:35 AM      Result Value Ref Range   Sodium 142  137 - 147 mEq/L   Potassium 3.7  3.7 - 5.3 mEq/L   Chloride 105  96 - 112 mEq/L   CO2 24  19 - 32 mEq/L   Glucose, Bld 89  70 - 99 mg/dL   BUN 16  6 - 23 mg/dL   Creatinine, Ser 1.17  0.50 - 1.35 mg/dL   Calcium 8.9  8.4 - 10.5 mg/dL   GFR calc non Af Amer 72 (*) >90 mL/min   GFR calc Af Amer 83 (*) >90 mL/min   Comment: (NOTE)     The eGFR has been calculated using the CKD EPI equation.     This calculation has not been validated in all clinical situations.     eGFR's persistently <90 mL/min signify possible Chronic Kidney     Disease.  URINALYSIS, ROUTINE W REFLEX MICROSCOPIC     Status: Abnormal   Collection Time    05/19/13  9:07 AM      Result Value Ref Range   Color, Urine YELLOW  YELLOW   APPearance CLEAR  CLEAR   Specific Gravity, Urine 1.019  1.005 - 1.030   pH 6.5  5.0 - 8.0   Glucose, UA NEGATIVE  NEGATIVE mg/dL   Hgb urine dipstick NEGATIVE  NEGATIVE   Bilirubin Urine NEGATIVE  NEGATIVE   Ketones, ur NEGATIVE  NEGATIVE mg/dL   Protein, ur 100 (*) NEGATIVE mg/dL   Urobilinogen, UA 1.0  0.0 - 1.0 mg/dL   Nitrite NEGATIVE  NEGATIVE   Leukocytes, UA NEGATIVE  NEGATIVE  URINE CULTURE     Status: None   Collection Time    05/19/13  9:07 AM      Result Value Ref Range   Specimen Description URINE, RANDOM     Special Requests NONE     Culture  Setup Time       Value: 05/19/2013 10:28     Performed at [facility]       Value: 50,000 COLONIES/ML  Performed at [facility]   Culture       Value: [place]     Performed at [facility]   Report Status PENDING    URINE MICROSCOPIC-ADD ON     Status: None   Collection Time    05/19/13  9:07 AM       Result Value Ref Range   Squamous Epithelial / LPF RARE  RARE   WBC, UA 0-2  <3 WBC/hpf   RBC / HPF 0-2  <3 RBC/hpf   Bacteria, UA RARE  RARE  PRO B NATRIURETIC PEPTIDE     Status: Abnormal   Collection Time    05/20/13  7:14 AM      Result Value Ref Range   Pro B Natriuretic peptide (BNP) 2735.0 (*) 0 - 125 pg/mL  BASIC METABOLIC PANEL     Status: Abnormal   Collection Time    05/20/13  7:14 AM      Result Value Ref Range   Sodium 140  137 - 147 mEq/L   Potassium 4.5  3.7 - 5.3 mEq/L   Comment: DELTA CHECK NOTED   Chloride 104  96 - 112 mEq/L   CO2 24  19 - 32 mEq/L   Glucose, Bld 103 (*) 70 - 99 mg/dL   BUN 12  6 - 23 mg/dL   Creatinine, Ser 1.13  0.50 - 1.35 mg/dL   Calcium 9.3  8.4 - 10.5 mg/dL   GFR calc non Af Amer 75 (*) >90 mL/min   GFR calc Af Amer 87 (*) >90 mL/min   Comment: (NOTE)     The eGFR has been calculated using the CKD EPI equation.     This calculation has not been validated in all clinical situations.     eGFR's persistently <90 mL/min signify possible Chronic Kidney     Disease.     HEENT: normal Cardio: RRR and no murmur Resp: CTA B/L and unlabored GI: BS positive and NT.ND Extremity:  Pulses positive and No Edema Skin:   Intact Musc/Skel:  Normal Right hemiparesis UE>LE. Mild sensory deficits on right side. Pt with right central 7 and tongue deviation. Speech dysarthric. Sensation intact to light touch in the upper and lower limbs. motor strength: RUE 3- delt,bi, 2- tri, grip. RLE 3- at HF,KE and KE/KF, 2-/5 at ankle   Assessment/Plan: 1. Functional deficits secondary to L MCA distribution infarct which require 3+ hours per day of interdisciplinary therapy in a comprehensive inpatient rehab setting. Physiatrist is providing close team supervision and 24 hour management of active medical problems listed below. Physiatrist and rehab team continue to assess barriers to discharge/monitor patient progress toward functional and medical  goals.  FIM: FIM - Bathing Bathing Steps Patient Completed: Chest;Abdomen;Right upper leg;Left upper leg;Right Arm;Right lower leg (including foot);Left lower leg (including foot);Buttocks;Front perineal area Bathing: 4: Min-Patient completes 8-9 66f10 parts or 75+ percent  FIM - Upper Body Dressing/Undressing Upper body dressing/undressing steps patient completed: Put head through opening of pull over shirt/dress;Pull shirt over trunk;Thread/unthread left sleeve of pullover shirt/dress Upper body dressing/undressing: 4: Min-Patient completed 75 plus % of tasks FIM - Lower Body Dressing/Undressing Lower body dressing/undressing steps patient completed: Thread/unthread left underwear leg;Thread/unthread left pants leg;Pull underwear up/down;Don/Doff left sock Lower body dressing/undressing: 3: Mod-Patient completed 50-74% of tasks  FIM - Toileting Toileting steps completed by patient: Adjust clothing prior to toileting (removed clothing completely) Toileting Assistive Devices: Grab bar or rail for support Toileting: 3: Mod-Patient  completed 2 of 3 steps  FIM - Radio producer Devices: Grab bars Toilet Transfers: 4-To toilet/BSC: Min A (steadying Pt. > 75%);4-From toilet/BSC: Min A (steadying Pt. > 75%)  FIM - Bed/Chair Transfer Bed/Chair Transfer Assistive Devices: Arm rests;Orthosis (R AFO (Blue Rocker)) Bed/Chair Transfer: 4: Supine > Sit: Min A (steadying Pt. > 75%/lift 1 leg);4: Bed > Chair or W/C: Min A (steadying Pt. > 75%)  FIM - Locomotion: Wheelchair Distance: 65 Locomotion: Wheelchair: 2: Travels 50 - 149 ft with minimal assistance (Pt.>75%) FIM - Locomotion: Ambulation Locomotion: Ambulation Assistive Devices: Orthosis;Other (comment) (L hallway hand rail; R AFO) Ambulation/Gait Assistance: 2: Max assist Locomotion: Ambulation: 1: Travels less than 50 ft with maximal assistance (Pt: 25 - 49%)  Comprehension Comprehension Mode:  Auditory Comprehension: 5-Follows basic conversation/direction: With no assist  Expression Expression Mode: Verbal Expression: 5-Expresses basic 90% of the time/requires cueing < 10% of the time.  Social Interaction Social Interaction: 4-Interacts appropriately 75 - 89% of the time - Needs redirection for appropriate language or to initiate interaction.  Problem Solving Problem Solving: 5-Solves basic problems: With no assist  Memory Memory: 4-Recognizes or recalls 75 - 89% of the time/requires cueing 10 - 24% of the time  Medical Problem List and Plan:  1. 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 Status post revascularization with clot retrieval  2. DVT Prophylaxis/Anticoagulation: SCDs. Monitor for any signs of DVT  3. Pain Management: Tylenol as needed  4. Mood/anxiety: Xanax 0.25 mg twice a day, Ativan 1 mg every 6 as as needed anxiety. Bed alarm for safety  5. Neuropsych: This patient is capable of making decisions on his own behalf.  6. Dysphagia. Dysphagia 2 thin liquids. Followup speech therapy  7.Hypertension. Cozaar 25 mg daily, Lasix 20 mg daily, Aldactone 12.5 mg daily. Monitor with increased mobility  8. Cardiomyopathy/CHF. Monitor for any signs of fluid overload. Followup cardiology services. Plan left heart catheterization as outpatient per cardiology 9.  UTI- citrobacter sens Cefazolin, tx x 7d 10.  Nausea: persistent with unclear source. KUB negative. abx started  -likely anxiety component, cards notes an issue prior to admit  -split up protonix  -try carafate and schedule zofran  -GI consult?   LOS (Days) 8 A FACE TO FACE EVALUATION WAS PERFORMED  Meredith Staggers 05/20/2013, 9:27 AM

## 2013-05-21 ENCOUNTER — Inpatient Hospital Stay (HOSPITAL_COMMUNITY): Payer: BC Managed Care – PPO

## 2013-05-21 LAB — URINE CULTURE

## 2013-05-21 NOTE — Progress Notes (Signed)
Physical Therapy Note  Patient Details  Name: VALENTINE BARNEY MRN: [id number] Date of Birth: [birth year] Today's Date: 05/21/2013 1350-1430, 40 min individual therapy No pain reported  Resting HR seated before starting tx= 85, steady.  Pt stated he was very fatigued due to lack of sleep, but is eager to participate.  W/c propulsion using hemi technique with LUE and LLE x 60' with supervision, encountering obstacle on R x 1; self corrected.  HR 63 at end of propulsion, dyspnea 2/4; HR quickly rose to 80. O2 sats > 95% at all times.  Strengthening R and LLE in sitting, x 10 each : long arc knee extension x 10 each, hip flexion.  HR before ex = 88, immediately after ex = 63.  Therapetic rest provided; discussed cardiac issues with RN.  Pt is awaiting cardiac cath on Monday, 05/23/13.   Sit> stand using hallway rail, min assist.  Wt shifting L><R, wearing R AFO, cues for hip activation with wt bearing; gait with mod assist x 4' with fingertip HR monitor on: HR upon standing = 92, after 4' remained 92 and steady.  No dyspnea, but therapeutic rest provided; gait x 8' : HR upon standing = 86, after 8' =42.  Pt asymptomatic.  HR quickly returned to 90 upon sitting.  Pt returned to room, left with all needs within reach.  Discussed deep diaphragmatic breathing to counter anxiety as needed.  Frederic Jericho 05/21/2013, 2:07 PM

## 2013-05-21 NOTE — Progress Notes (Signed)
Subjective/Complaints: [name] with history of CHF, stomach ulcers, sciatica LLE, brain lipoma; who was admitted to [facility] on 05/06/13 with right side weakness. He was treated with tPA and transferred to [facility] for further treatment. CT head negative for acute changes. 2D echo with EF 25% with diffuse hypokinesis and grade 2 diastolic dysfunction. Carotid dopplers without significant ICA stenosis. Patient with improvement after tPA . 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 Estanislado Pandy. He was evaluated by Dr. Aundra Dubin who who recommends heart cath prior to discharge to rule out CAD as cause of CM and expressed concerns that CVA could be cardioembolic due to low EF. Medications adjusted and . He was started on ASA and plavix for secondary stroke prevention given stent in place. He has had high levels of anxiety with SOB (hyperventilating?) requiring ativan. CTA of chest negative for PE but with evidence of nodal prominence of questionable etiology.  Nausea and dry heaves better during the day yesterday. Still having recurrent symptoms. Sleeps with basin at is side in bed. Denies chest pain. Still sob at times Review of Systems -   Objective: Vital Signs: Blood pressure 104/71, pulse 78, temperature 97.7 F (36.5 C), temperature source Axillary, resp. rate 17, height 6' (1.829 m), SpO2 100.00%. Dg Chest 2 View  05/19/2013   CLINICAL DATA:  Shortness of breath, weakness  EXAM: CHEST  2 VIEW  COMPARISON:  05/14/2013  FINDINGS: Cardiomegaly is noted. No acute infiltrate or pleural effusion. No pulmonary  edema. Bony thorax is unremarkable.  IMPRESSION: No active cardiopulmonary disease.   Electronically Signed   By: Lahoma Crocker M.D.   On: 05/19/2013 14:31   Dg Abd 1 View  05/19/2013   CLINICAL DATA:  Nausea and constipation  EXAM: ABDOMEN - 1 VIEW  COMPARISON:  May 07, 2013  FINDINGS: There is moderate stool in the colon. The bowel gas pattern is unremarkable. No obstruction or free air is seen on this supine examination. There are phleboliths in the pelvis.  IMPRESSION: Bowel gas pattern unremarkable.  Moderate stool in colon.   Electronically Signed   By: Lowella Grip M.D.   On: 05/19/2013 08:37   Results for orders placed during the hospital encounter of 05/12/13 (from the past 72 hour(s))  BASIC METABOLIC PANEL     Status: Abnormal   Collection Time    05/19/13  4:35 AM      Result Value Ref Range   Sodium 142  137 - 147 mEq/L   Potassium 3.7  3.7 - 5.3 mEq/L   Chloride 105  96 - 112 mEq/L   CO2 24  19 - 32 mEq/L   Glucose, Bld 89  70 - 99 mg/dL   BUN 16  6 - 23 mg/dL   Creatinine, Ser 1.17  0.50 - 1.35 mg/dL   Calcium 8.9  8.4 - 10.5 mg/dL   GFR calc non Af Amer 72 (*) >90 mL/min   GFR calc Af Amer 83 (*) >90 mL/min   Comment: (NOTE)     The eGFR has been calculated using the CKD EPI equation.     This calculation has not  been validated in all clinical situations.     eGFR's persistently <90 mL/min signify possible Chronic Kidney     Disease.  URINALYSIS, ROUTINE W REFLEX MICROSCOPIC     Status: Abnormal   Collection Time    05/19/13  9:07 AM      Result Value Ref Range   Color, Urine YELLOW  YELLOW   APPearance CLEAR  CLEAR   Specific Gravity, Urine 1.019  1.005 - 1.030   pH 6.5  5.0 - 8.0   Glucose, UA NEGATIVE  NEGATIVE mg/dL   Hgb urine dipstick NEGATIVE  NEGATIVE   Bilirubin Urine NEGATIVE  NEGATIVE   Ketones, ur NEGATIVE  NEGATIVE mg/dL   Protein, ur 100 (*) NEGATIVE mg/dL   Urobilinogen, UA 1.0  0.0 - 1.0 mg/dL   Nitrite NEGATIVE  NEGATIVE   Leukocytes, UA  NEGATIVE  NEGATIVE  URINE CULTURE     Status: None   Collection Time    05/19/13  9:07 AM      Result Value Ref Range   Specimen Description URINE, RANDOM     Special Requests NONE     Culture  Setup Time       Value: 05/19/2013 10:28     Performed at [facility]       Value: 50,000 COLONIES/ML     Performed at [facility]   Culture       Value: [place]     Performed at [facility]   Report Status PENDING    URINE MICROSCOPIC-ADD ON     Status: None   Collection Time    05/19/13  9:07 AM      Result Value Ref Range   Squamous Epithelial / LPF RARE  RARE   WBC, UA 0-2  <3 WBC/hpf   RBC / HPF 0-2  <3 RBC/hpf   Bacteria, UA RARE  RARE  PRO B NATRIURETIC PEPTIDE     Status: Abnormal   Collection Time    05/20/13  7:14 AM      Result Value Ref Range   Pro B Natriuretic peptide (BNP) 2735.0 (*) 0 - 125 pg/mL  BASIC METABOLIC PANEL     Status: Abnormal   Collection Time    05/20/13  7:14 AM      Result Value Ref Range   Sodium 140  137 - 147 mEq/L   Potassium 4.5  3.7 - 5.3 mEq/L   Comment: DELTA CHECK NOTED   Chloride 104  96 - 112 mEq/L   CO2 24  19 - 32 mEq/L   Glucose, Bld 103 (*) 70 - 99 mg/dL   BUN 12  6 - 23 mg/dL   Creatinine, Ser 1.13  0.50 - 1.35 mg/dL   Calcium 9.3  8.4 - 10.5 mg/dL   GFR calc non Af Amer 75 (*) >90 mL/min   GFR calc Af Amer 87 (*) >90 mL/min   Comment: (NOTE)     The eGFR has been calculated using the CKD EPI equation.     This calculation has not been validated in all clinical situations.     eGFR's persistently <90 mL/min signify possible Chronic Kidney     Disease.     HEENT: normal Cardio: RRR and no murmur Resp: CTA B/L and unlabored GI: BS positive and NT.ND Extremity:  Pulses positive and No Edema Skin:   Intact Musc/Skel:  Normal Right hemiparesis UE>LE. Mild sensory deficits on right side. Pt  with right central 7 and tongue deviation. Speech dysarthric. Sensation intact to  light touch in the upper and lower limbs. motor strength: RUE 3- delt,bi, 2- tri, grip. RLE 3- at HF,KE and KE/KF, 2-/5 at ankle   Assessment/Plan: 1. Functional deficits secondary to L MCA distribution infarct which require 3+ hours per day of interdisciplinary therapy in a comprehensive inpatient rehab setting. Physiatrist is providing close team supervision and 24 hour management of active medical problems listed below. Physiatrist and rehab team continue to assess barriers to discharge/monitor patient progress toward functional and medical goals.  FIM: FIM - Bathing Bathing Steps Patient Completed: Chest;Abdomen;Right upper leg;Left upper leg;Right Arm;Right lower leg (including foot);Left lower leg (including foot);Buttocks;Front perineal area Bathing: 4: Min-Patient completes 8-9 4f10 parts or 75+ percent  FIM - Upper Body Dressing/Undressing Upper body dressing/undressing steps patient completed: Put head through opening of pull over shirt/dress;Pull shirt over trunk;Thread/unthread left sleeve of pullover shirt/dress Upper body dressing/undressing: 4: Min-Patient completed 75 plus % of tasks FIM - Lower Body Dressing/Undressing Lower body dressing/undressing steps patient completed: Thread/unthread left underwear leg;Thread/unthread left pants leg;Pull underwear up/down;Don/Doff left sock;Don/Doff right sock Lower body dressing/undressing: 3: Mod-Patient completed 50-74% of tasks  FIM - Toileting Toileting steps completed by patient: Adjust clothing prior to toileting (removed clothing completely) Toileting Assistive Devices: Grab bar or rail for support Toileting: 3: Mod-Patient completed 2 of 3 steps  FIM - TRadio producerDevices: Grab bars Toilet Transfers: 4-To toilet/BSC: Min A (steadying Pt. > 75%)  FIM - BControl and instrumentation engineerDevices: Orthosis;Arm rests (R AFO) Bed/Chair Transfer: 4: Bed > Chair or W/C: Min A  (steadying Pt. > 75%);4: Chair or W/C > Bed: Min A (steadying Pt. > 75%)  FIM - Locomotion: Wheelchair Distance: 65 Locomotion: Wheelchair: 0: Activity did not occur FIM - Locomotion: Ambulation Locomotion: Ambulation Assistive Devices: Orthosis;Other (comment);Walker - Rolling (R AFO (Blue [place]er); R hand orthosis) Ambulation/Gait Assistance: 2: Max assist Locomotion: Ambulation: 0: Activity did not occur  Comprehension Comprehension Mode: Auditory Comprehension: 5-Understands complex 90% of the time/Cues < 10% of the time  Expression Expression Mode: Verbal Expression: 5-Expresses basic needs/ideas: With no assist  Social Interaction Social Interaction: 5-Interacts appropriately 90% of the time - Needs monitoring or encouragement for participation or interaction.  Problem Solving Problem Solving: 5-Solves basic problems: With no assist  Memory Memory: 5-Recognizes or recalls 90% of the time/requires cueing < 10% of the time  Medical Problem List and Plan:  1. 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 Status post revascularization with clot retrieval  2. DVT Prophylaxis/Anticoagulation: SCDs. Monitor for any signs of DVT  3. Pain Management: Tylenol as needed  4. Mood/anxiety: Xanax 0.25 mg twice a day, Ativan 1 mg every 6 as as needed anxiety. Bed alarm for safety  5. Neuropsych: This patient is capable of making decisions on his own behalf.  6. Dysphagia. Dysphagia 2 thin liquids. Followup speech therapy  7.Hypertension. Cozaar 25 mg daily, Lasix 20 mg daily, Aldactone 12.5 mg daily. Monitor with increased mobility  8. Cardiomyopathy/CHF. Monitor for any signs of fluid overload. Followup cardiology services.  -PLAN IS NOW FOR HEART CATH ON Monday---PT WILL NEED TO RETURN TO ACUTE POST-PROCEDURE 9.  UTI- citrobacter sens Cefazolin--abx stopped 10.  Nausea: persistent with unclear source. KUB negative. abx stopped  -likely  anxiety component, cards notes an issue prior to admit  -split up protonix to bid schedule  -continue carafate and scheduled  zofran  -GI consult?   LOS (Days) 9 A FACE TO FACE EVALUATION WAS PERFORMED  Meredith Staggers 05/21/2013, 7:59 AM

## 2013-05-21 NOTE — Plan of Care (Signed)
Problem: RH BLADDER ELIMINATION Goal: RH STG MANAGE BLADDER WITH ASSISTANCE STG Manage Bladder With min Assistance  Outcome: Not Progressing Condom cath inplace. Pt incont. Of bladder with increased frequency

## 2013-05-21 NOTE — Progress Notes (Signed)
Patient Name: Isaac Johnson Date of Encounter: 05/21/2013     Active Problems:   Systolic dysfunction -EF [id number] by 2D echo 05/2013   Anxiety state, unspecified   CVA (cerebral infarction)   Bradycardia   Acute systolic heart failure    SUBJECTIVE  No chest pain or dyspnea. Awaiting cath Monday.  CURRENT MEDS . aspirin  81 mg Oral Pre-Cath  . aspirin  325 mg Oral Q breakfast  . benzonatate  100 mg Oral TID  . carvedilol  3.125 mg Oral BID WC  . clopidogrel  75 mg Oral Q breakfast  . feeding supplement (ENSURE COMPLETE)  237 mL Oral BID BM  . furosemide  40 mg Oral Daily  . losartan  25 mg Oral Daily  . ondansetron  8 mg Oral Q12H  . pantoprazole  80 mg Oral QHS  . polyethylene glycol  17 g Oral Daily  . potassium chloride  40 mEq Oral Daily  . senna-docusate  2 tablet Oral QHS  . sodium chloride  3 mL Intravenous Q12H  . spironolactone  12.5 mg Oral Daily  . sucralfate  1 g Oral BID    OBJECTIVE  Filed Vitals:   05/20/13 1742 05/20/13 2149 05/21/13 0543 05/21/13 0851  BP: 114/75 109/77 104/71 122/82  Pulse: 87 84 78 96  Temp:  97.7 F (36.5 C) 97.7 F (36.5 C)   TempSrc:  Oral Axillary   Resp:  18 17   Height:      SpO2:  99% 100% 99%    Intake/Output Summary (Last 24 hours) at 05/21/13 1230 Last data filed at 05/21/13 1156  Gross per 24 hour  Intake   1080 ml  Output   2325 ml  Net  -1245 ml   There were no vitals filed for this visit.  PHYSICAL EXAM  General: Pleasant, NAD. Neuro: Alert and oriented X 3.  Right hemiparesis. Psych: Normal affect. HEENT:  Normal  Neck: Supple without bruits or JVD. Lungs:  Resp regular and unlabored, CTA. Heart: RRR no s3, s4, or murmurs. Abdomen: Soft, non-tender, non-distended, BS + x 4.  Extremities: No clubbing, cyanosis or edema. DP/PT/Radials 2+ and equal bilaterally.  Accessory Clinical Findings  CBC No results found for this basename: WBC, NEUTROABS, HGB, HCT, MCV, PLT,  in the last 72 hours Basic  Metabolic Panel  Recent Labs  05/19/13 0435 05/20/13 0714  NA 142 140  K 3.7 4.5  CL 105 104  CO2 24 24  GLUCOSE 89 103*  BUN 16 12  CREATININE 1.17 1.13  CALCIUM 8.9 9.3   Liver Function Tests No results found for this basename: AST, ALT, ALKPHOS, BILITOT, PROT, ALBUMIN,  in the last 72 hours No results found for this basename: LIPASE, AMYLASE,  in the last 72 hours Cardiac Enzymes No results found for this basename: CKTOTAL, CKMB, CKMBINDEX, TROPONINI,  in the last 72 hours BNP No components found with this basename: POCBNP,  D-Dimer No results found for this basename: DDIMER,  in the last 72 hours Hemoglobin A1C No results found for this basename: HGBA1C,  in the last 72 hours Fasting Lipid Panel No results found for this basename: CHOL, HDL, LDLCALC, TRIG, CHOLHDL, LDLDIRECT,  in the last 72 hours Thyroid Function Tests No results found for this basename: TSH, T4TOTAL, FREET3, T3FREE, THYROIDAB,  in the last 72 hours  TELE  Not on telemetry on Rehab  ECG    Radiology/Studies  Dg Chest 2 View  05/19/2013  CLINICAL DATA:  Shortness of breath, weakness  EXAM: CHEST  2 VIEW  COMPARISON:  05/14/2013  FINDINGS: Cardiomegaly is noted. No acute infiltrate or pleural effusion. No pulmonary edema. Bony thorax is unremarkable.  IMPRESSION: No active cardiopulmonary disease.   Electronically Signed   By: Lahoma Crocker M.D.   On: 05/19/2013 14:31   Dg Chest 2 View  05/07/2013   CLINICAL DATA:  Recent stroke  EXAM: CHEST  2 VIEW  COMPARISON:  01/20/2013  FINDINGS: Cardiac shadow is enlarged. Vascular congestion and mild pulmonary edema is noted bilaterally. No focal confluent infiltrate is seen. No sizable effusion is noted.  IMPRESSION: Vascular congestion with mild pulmonary edema.   Electronically Signed   By: Inez Catalina M.D.   On: 05/07/2013 07:10   Dg Abd 1 View  05/19/2013   CLINICAL DATA:  Nausea and constipation  EXAM: ABDOMEN - 1 VIEW  COMPARISON:  May 07, 2013   FINDINGS: There is moderate stool in the colon. The bowel gas pattern is unremarkable. No obstruction or free air is seen on this supine examination. There are phleboliths in the pelvis.  IMPRESSION: Bowel gas pattern unremarkable.  Moderate stool in colon.   Electronically Signed   By: Lowella Grip M.D.   On: 05/19/2013 08:37   Ct Head Wo Contrast  05/07/2013   CLINICAL DATA:  Post endovascular stent placement.  EXAM: CT HEAD WITHOUT CONTRAST  TECHNIQUE: Contiguous axial images were obtained from the base of the skull through the vertex without intravenous contrast.  COMPARISON:  Prior catheter directed angiogram performed earlier on the same day and MRI head from 05/06/2013  FINDINGS: Metallic stent is now seen within the left M1 segment. The stent extends from the supra clinoid aspect of the left internal carotid artery to the level of the MCA bifurcation.  Previously identified ischemic infarcts involving the left basal ganglia and left MCA territory are grossly stable. No new acute large vessel territory infarct. There is no intracranial hemorrhage. No mass lesion or midline shift.  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  No extra-axial fluid collection. No hydrocephalus. Calvarium is intact. Orbital soft tissues are normal. Paranasal sinuses and mastoid air cells are clear.  IMPRESSION: 1. Interval placement of endovascular stent within the left M1 segment. 2. Stable size and distribution of scattered ischemic infarcts involving the left basal ganglia and left MCA territory, not significantly changed relative to prior MRI from 05/06/2012. 3. No acute intracranial hemorrhage or new large vessel territory infarct identified.   Electronically Signed   By: Jeannine Boga M.D.   On: 05/07/2013 06:18   Ct Head Wo Contrast  05/06/2013   CLINICAL DATA:  Code stroke, right arm weakness.   EXAM: CT HEAD WITHOUT CONTRAST  TECHNIQUE: Contiguous axial images were obtained from the base of the skull through the vertex without intravenous contrast.  COMPARISON:  SP ANGIO/CAR/CERV BI dated 06/08/2008; MR MRA NECK WO/W CM dated 03/31/2008; MR HEAD WO/W CM dated 03/31/2008  FINDINGS: No acute large vascular territory infarct. No hemorrhage. No midline shift or mass effect.  Colpocephaly, with partial agenesis of the corpus callosum. No hydrocephalus, apparent absent ventricular septum.  Fatty mass within the supra cerebellar cistern again seen consistent with lipoma. Cerebellar tonsils are at but not below the foramen magnum. No abnormal extra-axial fluid collections.  Paranasal sinuses and mastoid air cells appear well-aerated. No skull fracture.  Ocular globes and orbital contents are unremarkable.  IMPRESSION: No acute large vascular territory infarct or hemorrhage. Please note, for evaluation of acute ischemia, MRI with diffusion-weighted sequences would be more sensitive.  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  Findings discussed with and reconfirmed by Dr.DOUGLAS DELO on 05/06/2013 12:00 AM.   Electronically Signed   By: Elon Alas   On: 05/06/2013 00:09   Ct Angio Chest Pe W/cm &/or Wo Cm  05/14/2013   CLINICAL DATA:  Evaluate for pulmonary embolism. Shortness of breath. Patient with known systolic dysfunction by echocardiogram.  EXAM: CT ANGIOGRAPHY CHEST WITH CONTRAST  TECHNIQUE: Multidetector CT imaging of the chest was performed using the standard protocol during bolus administration of intravenous contrast. Multiplanar CT image reconstructions and MIPs were obtained to evaluate the vascular anatomy.  CONTRAST:  140mL OMNIPAQUE IOHEXOL 350 MG/ML SOLN  COMPARISON:  CTA chest 05/10/2013  FINDINGS: Satisfactory opacification of the pulmonary arteries. Negative for pulmonary embolism. The thoracic aorta is normal in caliber and is only faintly opacified at the  time imaging. No gross evidence of aortic dissection.  The heart is moderately enlarged. The left atrium, left ventricle, and right atrium appear dilated. Trace anterior pericardial effusion noted. Negative for pleural effusion. Prominent subcarinal lymph node is unchanged, measuring up to 1.4 cm short axis. Mildly prominent by hilar lymph nodes are also stable. Esophagus is unremarkable.  There is intralobular septal thickening at the lung apices. There are mild ground-glass opacities in the left perihilar region. No focal consolidation. The trachea and mainstem bronchi are patent.  No acute bony abnormality identified.  Imaging of the upper abdomen demonstrates reflux of contrast into the inferior vena cava and hepatic veins, suggesting elevated right heart pressures.  Bilateral gynecomastia is noted.  Review of the MIP images confirms the above findings.  IMPRESSION: 1. Moderate cardiac enlargement with chamber dilatation and interstitial pulmonary edema. Cannot exclude mild airspace pulmonary edema in the left perihilar region. Reflux of contrast into the inferior vena cava and hepatic veins suggests elevated right heart pressures. 2. Unchanged mild mediastinal and bi hilar lymphadenopathy. 3. Negative for pulmonary embolism. 4. Bilateral gynecomastia.   Electronically Signed   By: Curlene Dolphin M.D.   On: 05/14/2013 17:46   Ct Angio Chest Pe W/cm &/or Wo Cm  05/10/2013   CLINICAL DATA:  Decreased O2 saturation.  Tachycardia.  EXAM: CT ANGIOGRAPHY CHEST WITH CONTRAST  TECHNIQUE: Multidetector CT imaging of the chest was performed using the standard protocol during bolus administration of intravenous contrast. Multiplanar CT image reconstructions and MIPs were obtained to evaluate the vascular anatomy.  CONTRAST:  118mL OMNIPAQUE IOHEXOL 350 MG/ML SOLN  COMPARISON:  Chest radiograph performed 05/07/2013  FINDINGS: There is no evidence of pulmonary embolus.  Small bilateral pleural effusions are noted. Mild  interstitial prominence raises concern for mild interstitial edema. Mild left basilar atelectasis is seen. There is no evidence of pneumothorax. No masses are identified; no abnormal focal contrast enhancement is seen.  The heart is enlarged. Prominent bilateral hilar nodes are seen, measuring up to 1.7 cm on the right and 1.2 cm on the left. These are of uncertain significance. Subcarinal and azygoesophageal recess nodes measure up to 1.4 cm in short axis. The mediastinum is otherwise unremarkable in appearance. Trace pericardial fluid remains within normal limits. The great vessels are grossly unremarkable in appearance. No axillary lymphadenopathy is seen. The visualized portions of the thyroid gland are unremarkable in appearance.  The visualized  portions of the liver and spleen are unremarkable.  No acute osseous abnormalities are seen.  Review of the MIP images confirms the above findings.  IMPRESSION: 1. No evidence of pulmonary embolus. 2. Small bilateral pleural effusions noted. Mild interstitial prominence raises concern for mild interstitial edema. 3. Mild left basilar atelectasis seen. 4. Cardiomegaly noted. 5. Prominent mediastinal and bilateral hilar nodes, of uncertain significance. These measure up to 1.4 cm at the subcarinal region and 1.7 cm at the right hilum. Would correlate for evidence of underlying systemic process.   Electronically Signed   By: Garald Balding M.D.   On: 05/10/2013 03:31   Mr Jodene Nam Head Wo Contrast  05/08/2013   CLINICAL DATA:  Cerebral ischemia, status post endovascular treatment. Right-sided weakness.  EXAM: MRI HEAD WITHOUT CONTRAST  MRA HEAD WITHOUT CONTRAST  TECHNIQUE: Multiplanar, multiecho pulse sequences of the brain and surrounding structures were obtained without intravenous contrast. Angiographic images of the head were obtained using MRA technique without contrast.  COMPARISON:  CT HEAD W/O CM dated 05/07/2013; IR ANGIO INTRA EXTRACRAN SEL INTERNAL CAROTID UNI L MOD  SED dated 05/06/2013; CT HEAD W/O CM dated 05/05/2013; MR HEAD W/O CM dated 05/06/2013; SP ANGIO/CAR/CERV BI dated 06/08/2008  FINDINGS: MRI HEAD FINDINGS  Slight progression of left MCA territory infarction, now involving more of the basal ganglia, including the caudate, putamen, and inferior globus pallidus as compared to the pretreatment MR 05/06/2013. Also new scattered areas of involvement of the left frontal cortex and subcortical white matter, left posterior temporal cortex, and left insular region. Gradient sequence demonstrates no significant hemorrhagic transformation. No midline shift.  Unchanged collicular plate lipoma. Negative orbits, sinuses, and mastoids.  MRA HEAD FINDINGS  There is gross patency of both internal carotid arteries. The left is dominant. Both anterior cerebrals fill from the left due to an atretic right A1 ACA. Fusiform prominence left ICA terminus appears similar to prior angiographic films. Basilar artery widely patent with both vertebrals contributing. No right MCA stenosis or occlusion. No intracranial aneurysm. Both posterior cerebral arteries are widely patent.  There is no visible flow related enhancement of the left MCA proximally or distally. The left MCA proximally appears to have re-occluded.  IMPRESSION: Slight progression of intracranial multifocal left MCA territory infarcts, as described above. No hemorrhage or midline shift.  No appreciable flow related enhancement in the left MCA proximally or distally, suspect re-occlusion of the left MCA ; see discussion above.   Electronically Signed   By: Rolla Flatten M.D.   On: 05/08/2013 10:11   Mr Brain Wo Contrast  05/08/2013   CLINICAL DATA:  Cerebral ischemia, status post endovascular treatment. Right-sided weakness.  EXAM: MRI HEAD WITHOUT CONTRAST  MRA HEAD WITHOUT CONTRAST  TECHNIQUE: Multiplanar, multiecho pulse sequences of the brain and surrounding structures were obtained without intravenous contrast. Angiographic images of  the head were obtained using MRA technique without contrast.  COMPARISON:  CT HEAD W/O CM dated 05/07/2013; IR ANGIO INTRA EXTRACRAN SEL INTERNAL CAROTID UNI L MOD SED dated 05/06/2013; CT HEAD W/O CM dated 05/05/2013; MR HEAD W/O CM dated 05/06/2013; SP ANGIO/CAR/CERV BI dated 06/08/2008  FINDINGS: MRI HEAD FINDINGS  Slight progression of left MCA territory infarction, now involving more of the basal ganglia, including the caudate, putamen, and inferior globus pallidus as compared to the pretreatment MR 05/06/2013. Also new scattered areas of involvement of the left frontal cortex and subcortical white matter, left posterior temporal cortex, and left insular region. Gradient sequence demonstrates no significant  hemorrhagic transformation. No midline shift.  Unchanged collicular plate lipoma. Negative orbits, sinuses, and mastoids.  MRA HEAD FINDINGS  There is gross patency of both internal carotid arteries. The left is dominant. Both anterior cerebrals fill from the left due to an atretic right A1 ACA. Fusiform prominence left ICA terminus appears similar to prior angiographic films. Basilar artery widely patent with both vertebrals contributing. No right MCA stenosis or occlusion. No intracranial aneurysm. Both posterior cerebral arteries are widely patent.  There is no visible flow related enhancement of the left MCA proximally or distally. The left MCA proximally appears to have re-occluded.  IMPRESSION: Slight progression of intracranial multifocal left MCA territory infarcts, as described above. No hemorrhage or midline shift.  No appreciable flow related enhancement in the left MCA proximally or distally, suspect re-occlusion of the left MCA ; see discussion above.   Electronically Signed   By: Rolla Flatten M.D.   On: 05/08/2013 10:11   Mr Brain Wo Contrast  05/06/2013   CLINICAL DATA:  Stroke, 24 hr status post tPA. Right-sided weakness.  EXAM: MRI HEAD WITHOUT CONTRAST  MRA HEAD WITHOUT CONTRAST  TECHNIQUE:  Multiplanar, multiecho pulse sequences of the brain and surrounding structures were obtained without intravenous contrast. Angiographic images of the head were obtained using MRA technique without contrast.  COMPARISON:  Prior CT from 05/05/2013 and MRI from 03/31/2008.  FINDINGS: MRI HEAD FINDINGS  The CSF containing spaces are within normal limits for patient age. No focal parenchymal signal abnormality is identified. 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 No other mass lesion identified. There is no midline shift, or extra-axial fluid collection. Ventricles are normal in size without evidence of hydrocephalus. The absence of the septum pellucidum noted.  There is abnormal restricted diffusion involving the left basal ganglia, most prevalent within the posterior aspect of the putamen. There is some involvement of the periventricular white matter anterior to the atrium of the left lateral ventricle. Scattered foci of restricted diffusion also seen within the insular cortex on the left bowel as well as the left temporoparietal region (series 3, image 19, 20). Additional small focus of restricted diffusion seen within the left centrum semi ovale of the left frontoparietal region (series 3, image 25). No evidence of hemorrhagic conversion. Tiny foci of hypointense signal intensity seen within the left putamen on GRE sequence likely represent associated petechial hemorrhages.  The cervicomedullary junction is normal. Pituitary gland is within normal limits. 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 Pituitary stalk is midline. The globes and optic nerves demonstrate a normal appearance with normal signal intensity. The  The bone marrow signal intensity is normal. Calvarium is intact. Visualized upper cervical spine is within normal limits.  Scalp soft tissues are unremarkable.  Paranasal sinuses are clear.  No mastoid effusion.  MRA HEAD  FINDINGS  The visualized distal cervical, petrous, cavernous, and supra clinoid segments of the internal carotid arteries are widely patent bilaterally with antegrade flow. The left A1 segment is widely patent. The right A1 segment is hypoplastic. Anterior communicating artery is normal. Anterior cerebral arteries are well opacified bilaterally.  There is focal proximal occlusion of the left M1 segment (series 6, image 71). No opacification of the left MCA territory artery branches seen distally.  The right M1 segment is well opacified with widely patent antegrade flow. Distal right MCA territory branches are within normal limits.  The left vertebral artery is dominant. Vertebral arteries are  well opacified with widely patent antegrade flow. Posterior inferior cerebral arteries are within normal limits. Vertebrobasilar junction and basilar artery are normal. Posterior cerebral arteries are well opacified. Superior cerebellar and anterior inferior cerebellar arteries are normal.  No intracranial aneurysm identified.  IMPRESSION: MRI BRAIN:  1. 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 No evidence of hemorrhagic conversion. 2. 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 MRA BRAIN:  1. Complete occlusion of the proximal left M1 segment. 2. No other proximal branch occlusion or high-grade flow-limiting stenosis identified within the intracranial circulation. 3. No intracranial aneurysm.  Critical Value/emergent results were called by telephone at the time of interpretation on 05/06/2013 at 11:00 PM to Dr. Roland Rack , who verbally acknowledged these results.   Electronically Signed   By: Jeannine Boga M.D.   On: 05/06/2013 23:13   Ir Thrombect Prim Mech Init (inclu) Mod Sed  05/20/2013   CLINICAL DATA:  Acute onset of right-sided paralysis, right facial droop and abnormal MRI/MRA of the brain.  EXAM: IR ANGIO INTRA EXTRACRAN SEL  INTERNAL CAROTID UNI LEFT MOD SED  ANESTHESIA/SEDATION: General anesthesia.  MEDICATIONS: As per general anesthesia.  CONTRAST:  80 mL OMNIPAQUE IOHEXOL 300 MG/ML SOLN  PROCEDURE: Following a full explanation of the procedure along with the potential associated complications, an informed witnessed consent was obtained.  The right groin was prepped and draped in the usual sterile fashion. Thereafter using modified Seldinger technique, transfemoral access into the right common femoral artery was obtained without difficulty. Over a 0.035 inch guidewire, a 5 French Pinnacle sheath was inserted. Through this, and also over a 0.035 inch guidewire, 5 French JB1 catheter was advanced to the aortic arch region and selectively positioned in the left common carotid artery.  An arteriogram was then performed centered over the carotid bifurcation intracranially. There were no acute complications. The patient tolerated the procedure well.  COMPLICATIONS: None immediate.  FINDINGS: The left common carotid arteriogram demonstrates the left external carotid artery and its major branches to be normal.  The left internal carotid artery at the bulb to the cranial skull base opacifies normally.  The petrous, the cavernous and the supraclinoid segments opacify normally.  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  The left anterior cerebral artery is seen to opacify normally into the capillary and the venous phases.  Delayed capillary phase demonstrates partial attempt at leptomeningeal collateralization of the left MCA perisylvian distribution, and the left parietal cortical subcortical region. Cross opacification via the anterior communicating artery of the right anterior cerebral A2 segment and distally is seen. The anterior choroidal artery is also normally opacified.  The angiographic findings were reviewed with the referring neurologist.  The option of endovascular revascularization in  attempt to rescue at risk left cerebral hemispheric brain tissue was discussed with the patient's spouse. The procedure, the reasons, the risks, benefits were all reviewed in detail.  The risk of intracranial hemorrhage of 10-15%, worsening neurological deficit, inability to revascularize, ventilator dependency and death were reviewed in detail.  After informed consent, the patient was put under general anesthesia by the Department of Anesthesiology at [place] catheter in the left common carotid artery was exchanged over a 0.035 inch 300 cm Rosen exchange guidewire for a 8 French 55 cm Brite tip neurovascular sheath using biplane roadmap technique, and constant fluoroscopic guidance. Good aspiration was obtained from the  side port of the Tuohy-Borst at the hub off the 8 [place] sheath. General contrast injection demonstrated no evidence of spasm, dissections or of intraluminal filling defects. This was then connected to continuous heparinized saline infusion.  Over the Svensen exchanged guidewire, an 8 [place] 95 cm Flowgate balloon guide catheter which had been prepped and purged with heparinized saline infusion and 75% contrast was then advanced and positioned proximal to the left carotid bifurcation. The guidewire was removed. Good aspiration was obtained from the hub of the 8 French balloon guide catheter.  A gentle contrast injection demonstrated no evidence of spasm, dissections or of intraluminal filling defects.  Over a 0.035 inch Roadrunner guidewire, using biplane roadmap technique and constant fluoroscopic guidance, the 8 [place] guide catheter was then advanced to the distal vertical segment of the left internal carotid artery. The guidewire was removed. Again good aspiration was obtained at the hub of the guide catheter. A control arteriogram performed centered over the intracranial compartment continued to demonstrate angiographically occluded left middle cerebral artery.   At this time in a coaxial manner with constant heparinized saline infusion a combination of DAC 125 microcatheter and an 18L Merci microcatheter was advanced over a 0.014 inch Softip Synchro microguidewire through the distal end of the guide catheter in the left internal carotid artery.  With the micro guidewire leading with the J-tip configuration, the combination was navigated to the supraclinoid left ICA. The left middle cerebral artery was then entered with the micro guidewire with slight difficulty and advanced to the distal M3 segment of the inferior branch. This was then followed by the microcatheter. The guidewire was then retrieved. Good aspiration was obtained from the hub of the microcatheter. A control angiogram performed through the 8 [place] guide catheter demonstrated the extent of the occlusion which also involved the superior division.  At this time, approximately 2 mg of superselective intra-arterial Integrelin were infused over approximately 2 minutes via the microcatheter.  A solitaire FR 36mm x 40 mm stent retrieval device which had been prepped and purged with heparinized saline infusion was then advanced in a coaxial manner and with constant heparinized saline infusion using biplane roadmap technique and constant fluoroscopic guidance to the distal end of the Merci microcatheter.  This was then deployed in the usual fashion covering the occluded segment. The retrieval device was left deployed for approximately 3 minutes. In the interim, a control arteriogram was performed through the 8 [place] guide catheter demonstrated significantly improved flow through the left middle cerebral artery distally. There appeared to be a waist at the proximal 1/3 of the left middle cerebral artery. The stent retrieval device proximally was then captured into the microcatheter. The occlusion balloon was then deployed in the distal left internal carotid artery. The combination of the retrieval device, the  microcatheter and the [facility] 125 microcatheter were then gently retrieved and removed as constant aspiration was applied using 60 mL syringe at the hub of the guide catheter.  Aspiration was continued as the balloon was deflated. Back bleed was allowed. The aspirate demonstrated no clots. A control arteriogram performed through the guide catheter demonstrated occluded left middle cerebral artery.  A second pass was then made with the micro catheter and deploying the Solitaire FR device optimally. Again after having left it deployed for 2 min, its proximal aspect was captured into the microcatheter. Again as mentioned above, the combination of the microcatheter with the retrieval device were then retrieved with proximal flow arrest and aspiration being performed with  a 60 mL syringe. Aspirate again contained no clots. A control arteriogram performed through the 8 [place] guide catheter demonstrated occluded left middle cerebral artery.  This prompted a third attempt with the Solitaire FR device as mentioned earlier.  At this time prior to the retrieval, the patient was given 2 mg of Integrelin into the left middle cerebral artery. Again the deployed combination of the retrieval device, the microcatheter were then gently retrieved with constant aspiration being applied with proximal flow arrest. Again the aspirate contained no filling defects.  A control arteriogram performed through the 8 [place] guide cathter again demonstrate occluded left middle cerebral artery.  A fourth attempt with a Trevo ProVue 3 mm x 20 mm stent retrieval device was then performed. Again after proximal flow arrest and proximal capture of the retrieval device, with constant aspiration, this combination was retrieved and removed. Back bleed was allowed. A control arteriogram performed through the 8 [place] guide catheter continued to demonstrate occluded left middle cerebral artery. It was now apparent that the patient had severe proximal left MCA  stenosis with an acute angulation.  After discussion with the referring neurologist, it was decided to place a rescue stent across the tight focal stenosis in order to facilitate patency of the tight focal stenosis of the left middle cerebral artery proximal.  At this time in a coaxial manner and with constant heparinized saline infusion, using biplane roadmap technique and constant fluoroscopic guidance, a 4.5 mm x 37 mm stent device were then advanced into and already positioned across the tight focal stenosis and into the distal left internal carotid artery terminus. Just prior to the placement of the enterprise stent, the patient was given an additional 1 mg of the superselective intra-arterial Integrelin over a minute. A control arteriogram performed following the placement of the stent reveal significantly improved flow through the left MCA distribution. No gross filling defects are seen.  Patient was also given 2000 units of IV heparin, and 650 mg of aspirin and 300 mg of Plavix via the NG tube prior to the placement of the stent.  The 8 [place] guide catheter and the 8 French neurovascular sheath were then retrieved and removed over a 0.035 inch guidewire for a 9 [place] Pinnacle sheath. This was then connected to continuous heparinized saline infusion. Throughout the procedure, there were no acute complications in terms of extravasation of contrast or, hemodynamic changes.  Patient was then transferred to the CT scanner for post procedural CT scan of the brain.  IMPRESSION: Status post endovascular complete revascularization of occluded left middle cerebral artery using 3 passes with the Solitaire FR retrieval device, and 1 pass with the Trevo ProVue 3 mm x 20 mm device, a total of 5 mg of superselective intra-arterial Integrelin, and with the placement of a rescue stent for recurrent left middle cerebral artery occlusion for a proximal severe stenosis.   Electronically Signed   By: Luanne Bras M.D.    On: 05/20/2013 15:13   Dg Chest Port 1 View  05/14/2013   CLINICAL DATA:  SOB; drop in HR  EXAM: PORTABLE CHEST - 1 VIEW  COMPARISON:  CT ANGIO CHEST W/CM &/OR WO/CM dated 05/10/2013; DG CHEST 1V PORT dated 05/07/2013  FINDINGS: Cardiac silhouette enlarged. Low lung volumes. Stable patchy areas of atelectasis. No new focal regions of consolidation nor new focal infiltrates. The osseous structures unremarkable. When compared to prior chest radiograph dated 05/07/2013 the endotracheal tube and NG tube have been removed.  IMPRESSION: No acute  cardiopulmonary disease. Stable patchy areas of atelectasis.   Electronically Signed   By: Margaree Mackintosh M.D.   On: 05/14/2013 13:00   Dg Chest Port 1 View  05/07/2013   CLINICAL DATA:  Check endotracheal tube placement  EXAM: PORTABLE CHEST - 1 VIEW  COMPARISON:  05/06/2013  FINDINGS: An endotracheal tube is now seen 5.1 cm above the carina. A nasogastric catheter is noted although the tip is coned off than the inferior aspect of the film. Proximal side hole lies within the distal esophagus. Patchy atelectatic changes are noted  IMPRESSION: Status post endotracheal tube and nasogastric catheter placement. Patchy atelectatic changes are noted bilaterally.   Electronically Signed   By: Inez Catalina M.D.   On: 05/07/2013 08:53   Dg Abd Portable 1v  05/07/2013   CLINICAL DATA:  Orogastric tube insertion  EXAM: PORTABLE ABDOMEN - 1 VIEW  COMPARISON:  None.  FINDINGS: OG tube tip is in the proximal stomach. Side port at the GE junction. Normal bowel gas pattern. Dense left lower lobe collapse/consolidation noted obscuring the left hemidiaphragm.  IMPRESSION: OG tube proximal stomach.  This could be advanced 7 cm.  Dense left base consolidation/collapse   Electronically Signed   By: Daryll Brod M.D.   On: 05/07/2013 08:51   Mr Jodene Nam Head/brain Wo Cm  05/06/2013   CLINICAL DATA:  Stroke, 24 hr status post tPA. Right-sided weakness.  EXAM: MRI HEAD WITHOUT CONTRAST  MRA HEAD WITHOUT  CONTRAST  TECHNIQUE: Multiplanar, multiecho pulse sequences of the brain and surrounding structures were obtained without intravenous contrast. Angiographic images of the head were obtained using MRA technique without contrast.  COMPARISON:  Prior CT from 05/05/2013 and MRI from 03/31/2008.  FINDINGS: MRI HEAD FINDINGS  The CSF containing spaces are within normal limits for patient age. No focal parenchymal signal abnormality is identified. 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 No other mass lesion identified. There is no midline shift, or extra-axial fluid collection. Ventricles are normal in size without evidence of hydrocephalus. The absence of the septum pellucidum noted.  There is abnormal restricted diffusion involving the left basal ganglia, most prevalent within the posterior aspect of the putamen. There is some involvement of the periventricular white matter anterior to the atrium of the left lateral ventricle. Scattered foci of restricted diffusion also seen within the insular cortex on the left bowel as well as the left temporoparietal region (series 3, image 19, 20). Additional small focus of restricted diffusion seen within the left centrum semi ovale of the left frontoparietal region (series 3, image 25). No evidence of hemorrhagic conversion. Tiny foci of hypointense signal intensity seen within the left putamen on GRE sequence likely represent associated petechial hemorrhages.  The cervicomedullary junction is normal. Pituitary gland is within normal limits. 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 Pituitary stalk is midline. The globes and optic nerves demonstrate a normal appearance with normal signal intensity. The  The bone marrow signal intensity is normal. Calvarium is intact. Visualized upper cervical spine is within normal limits.  Scalp soft tissues are unremarkable.  Paranasal sinuses are clear.  No mastoid  effusion.  MRA HEAD FINDINGS  The visualized distal cervical, petrous, cavernous, and supra clinoid segments of the internal carotid arteries are widely patent bilaterally with antegrade flow. The left A1 segment is widely patent. The right A1 segment is hypoplastic. Anterior communicating artery is normal. Anterior cerebral arteries are well opacified bilaterally.  There is focal proximal occlusion of the left M1 segment (series 6, image 71). No opacification of the left MCA territory artery branches seen distally.  The right M1 segment is well opacified with widely patent antegrade flow. Distal right MCA territory branches are within normal limits.  The left vertebral artery is dominant. Vertebral arteries are well opacified with widely patent antegrade flow. Posterior inferior cerebral arteries are within normal limits. Vertebrobasilar junction and basilar artery are normal. Posterior cerebral arteries are well opacified. Superior cerebellar and anterior inferior cerebellar arteries are normal.  No intracranial aneurysm identified.  IMPRESSION: MRI BRAIN:  1. 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 No evidence of hemorrhagic conversion. 2. 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 MRA BRAIN:  1. Complete occlusion of the proximal left M1 segment. 2. No other proximal branch occlusion or high-grade flow-limiting stenosis identified within the intracranial circulation. 3. No intracranial aneurysm.  Critical Value/emergent results were called by telephone at the time of interpretation on 05/06/2013 at 11:00 PM to Dr. Roland Rack , who verbally acknowledged these results.   Electronically Signed   By: Jeannine Boga M.D.   On: 05/06/2013 23:13   Ir Angio Intra Extracran Sel Internal Carotid Uni L Mod Sed  05/10/2013   CLINICAL DATA:  Acute onset of right-sided paralysis, right facial droop and abnormal MRI/MRA of the brain.   EXAM: IR ANGIO INTRA EXTRACRAN SEL INTERNAL CAROTID UNI LEFT MOD SED  ANESTHESIA/SEDATION: General anesthesia.  MEDICATIONS: As per general anesthesia.  CONTRAST:  80 mL OMNIPAQUE IOHEXOL 300 MG/ML  SOLN  PROCEDURE: Following a full explanation of the procedure along with the potential associated complications, an informed witnessed consent was obtained.  The right groin was prepped and draped in the usual sterile fashion. Thereafter using modified Seldinger technique, transfemoral access into the right common femoral artery was obtained without difficulty. Over a 0.035 inch guidewire, a 5 French Pinnacle sheath was inserted. Through this, and also over a 0.035 inch guidewire, 5 French JB1 catheter was advanced to the aortic arch region and selectively positioned in the left common carotid artery.  An arteriogram was then performed centered over the carotid bifurcation intracranially. There were no acute complications. The patient tolerated the procedure well.  COMPLICATIONS: None immediate.  FINDINGS: The left common carotid arteriogram demonstrates the left external carotid artery and its major branches to be normal.  The left internal carotid artery at the bulb to the cranial skull base opacifies normally.  The petrous, the cavernous and the supraclinoid segments opacify normally.  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  The left anterior cerebral artery is seen to opacify normally into the capillary and the venous phases.  Delayed capillary phase demonstrates partial attempt at leptomeningeal collateralization of the left MCA perisylvian distribution, and the left parietal cortical subcortical region. Cross opacification via the anterior communicating artery of the right anterior cerebral A2 segment and distally is seen. The anterior choroidal artery is also normally opacified.  The angiographic findings were reviewed with the referring neurologist.  The option  of endovascular revascularization in attempt to rescue at risk left cerebral hemispheric brain tissue was discussed with the patient's spouse. The procedure, the reasons, the risks, benefits were all reviewed in detail.  The risk of intracranial hemorrhage of 10-15%, worsening neurological deficit, inability to revascularize, ventilator dependency and death were reviewed in detail.  After informed consent, the  patient was put under general anesthesia by the Department of Anesthesiology at [place] catheter in the left common carotid artery was exchanged over a 0.035 inch 300 cm Rosen exchange guidewire for a 8 [place] 55 cm Brite tip neurovascular sheath using biplane roadmap technique, and constant fluoroscopic guidance. Good aspiration was obtained from the side port of the Tuohy-Borst at the hub off the 8 [place] sheath. General contrast injection demonstrated no evidence of spasm, dissections or of intraluminal filling defects. This was then connected to continuous heparinized saline infusion.  Over the [place] exchanged guidewire, an 8 [place] 95 cm Flowgate balloon guide catheter which had been prepped and purged with heparinized saline infusion and 75% contrast was then advanced and positioned proximal to the left carotid bifurcation. The guidewire was removed. Good aspiration was obtained from the hub of the 8 French balloon guide catheter.  A gentle contrast injection demonstrated no evidence of spasm, dissections or of intraluminal filling defects.  Over a 0.035 inch Roadrunner guidewire, using biplane roadmap technique and constant fluoroscopic guidance, the 8 [place] guide catheter was then advanced to the distal vertical segment of the left internal carotid artery. The guidewire was removed. Again good aspiration was obtained at the hub of the guide catheter. A control arteriogram performed centered over the intracranial compartment continued to demonstrate angiographically  occluded left middle cerebral artery.  At this time in a coaxial manner with constant heparinized saline infusion a combination of DAC 125 microcatheter and an 18L Merci microcatheter was advanced over a 0.014 inch Softip Synchro microguidewire through the distal end of the guide catheter in the left internal carotid artery.  With the micro guidewire leading with the J-tip configuration, the combination was navigated to the supraclinoid left ICA. The left middle cerebral artery was then entered with the micro guidewire with slight difficulty and advanced to the distal M3 segment of the inferior branch. This was then followed by the microcatheter. The guidewire was then retrieved. Good aspiration was obtained from the hub of the microcatheter. A control angiogram performed through the 8 [place] guide catheter demonstrated the extent of the occlusion which also involved the superior division.  At this time, approximately 2 mg of superselective intra-arterial Integrelin were infused over approximately 2 minutes via the microcatheter.  A solitaire FR 29mm x 40 mm stent retrieval device which had been prepped and purged with heparinized saline infusion was then advanced in a coaxial manner and with constant heparinized saline infusion using biplane roadmap technique and constant fluoroscopic guidance to the distal end of the Merci microcatheter.  This was then deployed in the usual fashion covering the occluded segment. The retrieval device was left deployed for approximately 3 minutes. In the interim, a control arteriogram was performed through the 8 [place] guide catheter demonstrated significantly improved flow through the left middle cerebral artery distally. There appeared to be a waist at the proximal 1/3 of the left middle cerebral artery. The stent retrieval device proximally was then captured into the microcatheter. The occlusion balloon was then deployed in the distal left internal carotid artery. The combination  of the retrieval device, the microcatheter and the [facility] 125 microcatheter were then gently retrieved and removed as constant aspiration was applied using 60 mL syringe at the hub of the guide catheter.  Aspiration was continued as the balloon was deflated. Back bleed was allowed. The aspirate demonstrated no clots. A control arteriogram performed through the guide catheter demonstrated occluded left middle cerebral  artery.  A second pass was then made with the micro catheter and deploying the Solitaire FR device optimally. Again after having left it deployed for 2 min, its proximal aspect was captured into the microcatheter. Again as mentioned above, the combination of the microcatheter with the retrieval device were then retrieved with proximal flow arrest and aspiration being performed with a 60 mL syringe. Aspirate again contained no clots. A control arteriogram performed through the 8 [place] guide catheter demonstrated occluded left middle cerebral artery.  This prompted a third attempt with the Solitaire FR device as mentioned earlier.  At this time prior to the retrieval, the patient was given 2 mg of Integrelin into the left middle cerebral artery. Again the deployed combination of the retrieval device, the microcatheter were then gently retrieved with constant aspiration being applied with proximal flow arrest. Again the aspirate contained no filling defects.  A control arteriogram performed through the 8 [place] guide cathter again demonstrate occluded left middle cerebral artery.  A fourth attempt with a Trevo ProVue 3 mm x 20 mm stent retrieval device was then performed. Again after proximal flow arrest and proximal capture of the retrieval device, with constant aspiration, this combination was retrieved and removed. Back bleed was allowed. A control arteriogram performed through the 8 [place] guide catheter continued to demonstrate occluded left middle cerebral artery. It was now apparent that the patient  had severe proximal left MCA stenosis with an acute angulation.  After discussion with the referring neurologist, it was decided to place a rescue stent across the tight focal stenosis in order to facilitate patency of the tight focal stenosis of the left middle cerebral artery proximal.  At this time in a coaxial manner and with constant heparinized saline infusion, using biplane roadmap technique and constant fluoroscopic guidance, a 4.5 mm x 37 mm stent device were then advanced into and already positioned across the tight focal stenosis and into the distal left internal carotid artery terminus. Just prior to the placement of the enterprise stent, the patient was given an additional 1 mg of the superselective intra-arterial Integrelin over a minute. A control arteriogram performed following the placement of the stent reveal significantly improved flow through the left MCA distribution. No gross filling defects are seen.  Patient was also given 2000 units of IV heparin, and 650 mg of aspirin and 300 mg of Plavix via the NG tube prior to the placement of the stent.  The 8 [place] guide catheter and the 8 French neurovascular sheath were then retrieved and removed over a 0.035 inch guidewire for a 9 [place] Pinnacle sheath. This was then connected to continuous heparinized saline infusion. Throughout the procedure, there were no acute complications in terms of extravasation of contrast or, hemodynamic changes.  Patient was then transferred to the CT scanner for post procedural CT scan of the brain.  IMPRESSION: Status post endovascular complete revascularization of occluded left middle cerebral artery using 3 passes with the Solitaire FR retrieval device, and 1 pass with the Trevo ProVue 3 mm x 20 mm device, a total of 5 mg of superselective intra-arterial Integrelin, and with the placement of a rescue stent for recurrent left middle cerebral artery occlusion for a proximal severe stenosis.   Electronically Signed    By: Luanne Bras M.D.   On: 05/09/2013 15:10   Ir Neuro Each Add'l After Basic Uni Left (ms)  05/20/2013   CLINICAL DATA:  Acute onset of right-sided paralysis, right facial droop and abnormal MRI/MRA  of the brain.  EXAM: IR ANGIO INTRA EXTRACRAN SEL INTERNAL CAROTID UNI LEFT MOD SED  ANESTHESIA/SEDATION: General anesthesia.  MEDICATIONS: As per general anesthesia.  CONTRAST:  80 mL OMNIPAQUE IOHEXOL 300 MG/ML SOLN  PROCEDURE: Following a full explanation of the procedure along with the potential associated complications, an informed witnessed consent was obtained.  The right groin was prepped and draped in the usual sterile fashion. Thereafter using modified Seldinger technique, transfemoral access into the right common femoral artery was obtained without difficulty. Over a 0.035 inch guidewire, a 5 French Pinnacle sheath was inserted. Through this, and also over a 0.035 inch guidewire, 5 French JB1 catheter was advanced to the aortic arch region and selectively positioned in the left common carotid artery.  An arteriogram was then performed centered over the carotid bifurcation intracranially. There were no acute complications. The patient tolerated the procedure well.  COMPLICATIONS: None immediate.  FINDINGS: The left common carotid arteriogram demonstrates the left external carotid artery and its major branches to be normal.  The left internal carotid artery at the bulb to the cranial skull base opacifies normally.  The petrous, the cavernous and the supraclinoid segments opacify normally.  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  The left anterior cerebral artery is seen to opacify normally into the capillary and the venous phases.  Delayed capillary phase demonstrates partial attempt at leptomeningeal collateralization of the left MCA perisylvian distribution, and the left parietal cortical subcortical region. Cross opacification via the anterior  communicating artery of the right anterior cerebral A2 segment and distally is seen. The anterior choroidal artery is also normally opacified.  The angiographic findings were reviewed with the referring neurologist.  The option of endovascular revascularization in attempt to rescue at risk left cerebral hemispheric brain tissue was discussed with the patient's spouse. The procedure, the reasons, the risks, benefits were all reviewed in detail.  The risk of intracranial hemorrhage of 10-15%, worsening neurological deficit, inability to revascularize, ventilator dependency and death were reviewed in detail.  After informed consent, the patient was put under general anesthesia by the Department of Anesthesiology at [place] catheter in the left common carotid artery was exchanged over a 0.035 inch 300 cm Rosen exchange guidewire for a 8 [place] 55 cm Brite tip neurovascular sheath using biplane roadmap technique, and constant fluoroscopic guidance. Good aspiration was obtained from the side port of the Tuohy-Borst at the hub off the 8 [place] sheath. General contrast injection demonstrated no evidence of spasm, dissections or of intraluminal filling defects. This was then connected to continuous heparinized saline infusion.  Over the [place] exchanged guidewire, an 8 [place] 95 cm Flowgate balloon guide catheter which had been prepped and purged with heparinized saline infusion and 75% contrast was then advanced and positioned proximal to the left carotid bifurcation. The guidewire was removed. Good aspiration was obtained from the hub of the 8 French balloon guide catheter.  A gentle contrast injection demonstrated no evidence of spasm, dissections or of intraluminal filling defects.  Over a 0.035 inch Roadrunner guidewire, using biplane roadmap technique and constant fluoroscopic guidance, the 8 [place] guide catheter was then advanced to the distal vertical segment of the left internal  carotid artery. The guidewire was removed. Again good aspiration was obtained at the hub of the guide catheter. A control arteriogram performed centered over the intracranial compartment continued to demonstrate angiographically occluded left middle cerebral artery.  At this time  in a coaxial manner with constant heparinized saline infusion a combination of DAC 125 microcatheter and an 18L Merci microcatheter was advanced over a 0.014 inch Softip Synchro microguidewire through the distal end of the guide catheter in the left internal carotid artery.  With the micro guidewire leading with the J-tip configuration, the combination was navigated to the supraclinoid left ICA. The left middle cerebral artery was then entered with the micro guidewire with slight difficulty and advanced to the distal M3 segment of the inferior branch. This was then followed by the microcatheter. The guidewire was then retrieved. Good aspiration was obtained from the hub of the microcatheter. A control angiogram performed through the 8 [place] guide catheter demonstrated the extent of the occlusion which also involved the superior division.  At this time, approximately 2 mg of superselective intra-arterial Integrelin were infused over approximately 2 minutes via the microcatheter.  A solitaire FR 38mm x 40 mm stent retrieval device which had been prepped and purged with heparinized saline infusion was then advanced in a coaxial manner and with constant heparinized saline infusion using biplane roadmap technique and constant fluoroscopic guidance to the distal end of the Merci microcatheter.  This was then deployed in the usual fashion covering the occluded segment. The retrieval device was left deployed for approximately 3 minutes. In the interim, a control arteriogram was performed through the 8 [place] guide catheter demonstrated significantly improved flow through the left middle cerebral artery distally. There appeared to be a waist at the  proximal 1/3 of the left middle cerebral artery. The stent retrieval device proximally was then captured into the microcatheter. The occlusion balloon was then deployed in the distal left internal carotid artery. The combination of the retrieval device, the microcatheter and the Scnetx 125 microcatheter were then gently retrieved and removed as constant aspiration was applied using 60 mL syringe at the hub of the guide catheter.  Aspiration was continued as the balloon was deflated. Back bleed was allowed. The aspirate demonstrated no clots. A control arteriogram performed through the guide catheter demonstrated occluded left middle cerebral artery.  A second pass was then made with the micro catheter and deploying the Solitaire FR device optimally. Again after having left it deployed for 2 min, its proximal aspect was captured into the microcatheter. Again as mentioned above, the combination of the microcatheter with the retrieval device were then retrieved with proximal flow arrest and aspiration being performed with a 60 mL syringe. Aspirate again contained no clots. A control arteriogram performed through the 8 [place] guide catheter demonstrated occluded left middle cerebral artery.  This prompted a third attempt with the Solitaire FR device as mentioned earlier.  At this time prior to the retrieval, the patient was given 2 mg of Integrelin into the left middle cerebral artery. Again the deployed combination of the retrieval device, the microcatheter were then gently retrieved with constant aspiration being applied with proximal flow arrest. Again the aspirate contained no filling defects.  A control arteriogram performed through the 8 [place] guide cathter again demonstrate occluded left middle cerebral artery.  A fourth attempt with a Trevo ProVue 3 mm x 20 mm stent retrieval device was then performed. Again after proximal flow arrest and proximal capture of the retrieval device, with constant aspiration, this  combination was retrieved and removed. Back bleed was allowed. A control arteriogram performed through the 8 [place] guide catheter continued to demonstrate occluded left middle cerebral artery. It was now apparent that the patient had severe proximal  left MCA stenosis with an acute angulation.  After discussion with the referring neurologist, it was decided to place a rescue stent across the tight focal stenosis in order to facilitate patency of the tight focal stenosis of the left middle cerebral artery proximal.  At this time in a coaxial manner and with constant heparinized saline infusion, using biplane roadmap technique and constant fluoroscopic guidance, a 4.5 mm x 37 mm stent device were then advanced into and already positioned across the tight focal stenosis and into the distal left internal carotid artery terminus. Just prior to the placement of the enterprise stent, the patient was given an additional 1 mg of the superselective intra-arterial Integrelin over a minute. A control arteriogram performed following the placement of the stent reveal significantly improved flow through the left MCA distribution. No gross filling defects are seen.  Patient was also given 2000 units of IV heparin, and 650 mg of aspirin and 300 mg of Plavix via the NG tube prior to the placement of the stent.  The 8 [place] guide catheter and the 8 French neurovascular sheath were then retrieved and removed over a 0.035 inch guidewire for a 9 [place] Pinnacle sheath. This was then connected to continuous heparinized saline infusion. Throughout the procedure, there were no acute complications in terms of extravasation of contrast or, hemodynamic changes.  Patient was then transferred to the CT scanner for post procedural CT scan of the brain.  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   Electronically Signed   By: Luanne Bras M.D.   On: 05/20/2013 15:13    ASSESSMENT AND PLAN 1.  Acute systolic heart failure, improved. 2. CVA 3. Dilated cardiomyopathy EF 25%  Plan:Left and Right heart cath Monday.  Signed, Darlin Coco MD

## 2013-05-22 ENCOUNTER — Ambulatory Visit (HOSPITAL_COMMUNITY): Payer: BC Managed Care – PPO

## 2013-05-22 ENCOUNTER — Inpatient Hospital Stay (HOSPITAL_COMMUNITY): Payer: BC Managed Care – PPO

## 2013-05-22 LAB — BASIC METABOLIC PANEL
BUN: 13 mg/dL (ref 6–23)
CALCIUM: 9.3 mg/dL (ref 8.4–10.5)
CO2: 26 mEq/L (ref 19–32)
CREATININE: 1.26 mg/dL (ref 0.50–1.35)
Chloride: 102 mEq/L (ref 96–112)
GFR calc Af Amer: 76 mL/min — ABNORMAL LOW (ref >90)
GFR, EST NON AFRICAN AMERICAN: 65 mL/min — AB (ref >90)
Glucose, Bld: 100 mg/dL — ABNORMAL HIGH (ref 70–99)
Potassium: 4.7 mEq/L (ref 3.7–5.3)
SODIUM: 139 meq/L (ref 137–147)

## 2013-05-22 LAB — CBC
HCT: 40.4 % (ref 39.0–52.0)
Hemoglobin: 13.7 g/dL (ref 13.0–17.0)
MCH: 32.3 pg (ref 26.0–34.0)
MCHC: 33.9 g/dL (ref 30.0–36.0)
MCV: 95.3 fL (ref 78.0–100.0)
PLATELETS: 263 10*3/uL (ref 150–400)
RBC: 4.24 MIL/uL (ref 4.22–5.81)
RDW: 13.7 % (ref 11.5–15.5)
WBC: 4.4 10*3/uL (ref 4.0–10.5)

## 2013-05-22 LAB — PROTIME-INR
INR: 0.93 (ref 0.00–1.49)
PROTHROMBIN TIME: 12.3 s (ref 11.6–15.2)

## 2013-05-22 NOTE — Progress Notes (Signed)
Patient Name: Isaac Johnson Date of Encounter: 05/22/2013     Active Problems:   Systolic dysfunction -EF [id number] by 2D echo 05/2013   Anxiety state, unspecified   CVA (cerebral infarction)   Bradycardia   Acute systolic heart failure    SUBJECTIVE  Occasional mild dyspnea.  No chest discomfort.  Rhythm regular on bedside exam.  CURRENT MEDS . aspirin  325 mg Oral Q breakfast  . benzonatate  100 mg Oral TID  . carvedilol  3.125 mg Oral BID WC  . clopidogrel  75 mg Oral Q breakfast  . feeding supplement (ENSURE COMPLETE)  237 mL Oral BID BM  . furosemide  40 mg Oral Daily  . losartan  25 mg Oral Daily  . ondansetron  8 mg Oral Q12H  . pantoprazole  80 mg Oral QHS  . polyethylene glycol  17 g Oral Daily  . potassium chloride  40 mEq Oral Daily  . senna-docusate  2 tablet Oral QHS  . sodium chloride  3 mL Intravenous Q12H  . spironolactone  12.5 mg Oral Daily  . sucralfate  1 g Oral BID    OBJECTIVE  Filed Vitals:   05/21/13 1447 05/21/13 1814 05/21/13 2236 05/22/13 0527  BP: 105/74 110/78 117/82 114/80  Pulse: 85 84 78 76  Temp: 97.6 F (36.4 C)  98.6 F (37 C) 97.9 F (36.6 C)  TempSrc: Oral  Oral Oral  Resp: 22  18 18   Height:      SpO2: 99% 99% 100% 100%    Intake/Output Summary (Last 24 hours) at 05/22/13 1149 Last data filed at 05/22/13 0830  Gross per 24 hour  Intake   1040 ml  Output   2550 ml  Net  -1510 ml   There were no vitals filed for this visit.  PHYSICAL EXAM  General: Pleasant, NAD.  Neuro: Alert and oriented X 3. Right hemiparesis.  Psych: Normal affect.  HEENT: Normal  Neck: Supple without bruits or JVD.  Lungs: Resp regular and unlabored, CTA.  Heart: RRR no s3, s4, or murmurs.  Abdomen: Soft, non-tender, non-distended, BS + x 4.  Extremities: No clubbing, cyanosis or edema. DP/PT/Radials 2+ and equal bilaterally.   Accessory Clinical Findings  CBC No results found for this basename: WBC, NEUTROABS, HGB, HCT, MCV, PLT,  in the  last 72 hours Basic Metabolic Panel  Recent Labs  05/20/13 0714  NA 140  K 4.5  CL 104  CO2 24  GLUCOSE 103*  BUN 12  CREATININE 1.13  CALCIUM 9.3   Liver Function Tests No results found for this basename: AST, ALT, ALKPHOS, BILITOT, PROT, ALBUMIN,  in the last 72 hours No results found for this basename: LIPASE, AMYLASE,  in the last 72 hours Cardiac Enzymes No results found for this basename: CKTOTAL, CKMB, CKMBINDEX, TROPONINI,  in the last 72 hours BNP No components found with this basename: POCBNP,  D-Dimer No results found for this basename: DDIMER,  in the last 72 hours Hemoglobin A1C No results found for this basename: HGBA1C,  in the last 72 hours Fasting Lipid Panel No results found for this basename: CHOL, HDL, LDLCALC, TRIG, CHOLHDL, LDLDIRECT,  in the last 72 hours Thyroid Function Tests No results found for this basename: TSH, T4TOTAL, FREET3, T3FREE, THYROIDAB,  in the last 72 hours  TELE Not on telemetry  ECG    Radiology/Studies  Dg Chest 2 View  05/19/2013   CLINICAL DATA:  Shortness of breath, weakness  EXAM:  CHEST  2 VIEW  COMPARISON:  05/14/2013  FINDINGS: Cardiomegaly is noted. No acute infiltrate or pleural effusion. No pulmonary edema. Bony thorax is unremarkable.  IMPRESSION: No active cardiopulmonary disease.   Electronically Signed   By: Lahoma Crocker M.D.   On: 05/19/2013 14:31   Dg Chest 2 View  05/07/2013   CLINICAL DATA:  Recent stroke  EXAM: CHEST  2 VIEW  COMPARISON:  01/20/2013  FINDINGS: Cardiac shadow is enlarged. Vascular congestion and mild pulmonary edema is noted bilaterally. No focal confluent infiltrate is seen. No sizable effusion is noted.  IMPRESSION: Vascular congestion with mild pulmonary edema.   Electronically Signed   By: Inez Catalina M.D.   On: 05/07/2013 07:10   Dg Abd 1 View  05/19/2013   CLINICAL DATA:  Nausea and constipation  EXAM: ABDOMEN - 1 VIEW  COMPARISON:  May 07, 2013  FINDINGS: There is moderate stool in the  colon. The bowel gas pattern is unremarkable. No obstruction or free air is seen on this supine examination. There are phleboliths in the pelvis.  IMPRESSION: Bowel gas pattern unremarkable.  Moderate stool in colon.   Electronically Signed   By: Lowella Grip M.D.   On: 05/19/2013 08:37   Ct Head Wo Contrast  05/07/2013   CLINICAL DATA:  Post endovascular stent placement.  EXAM: CT HEAD WITHOUT CONTRAST  TECHNIQUE: Contiguous axial images were obtained from the base of the skull through the vertex without intravenous contrast.  COMPARISON:  Prior catheter directed angiogram performed earlier on the same day and MRI head from 05/06/2013  FINDINGS: Metallic stent is now seen within the left M1 segment. The stent extends from the supra clinoid aspect of the left internal carotid artery to the level of the MCA bifurcation.  Previously identified ischemic infarcts involving the left basal ganglia and left MCA territory are grossly stable. No new acute large vessel territory infarct. There is no intracranial hemorrhage. No mass lesion or midline shift.  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  No extra-axial fluid collection. No hydrocephalus. Calvarium is intact. Orbital soft tissues are normal. Paranasal sinuses and mastoid air cells are clear.  IMPRESSION: 1. Interval placement of endovascular stent within the left M1 segment. 2. Stable size and distribution of scattered ischemic infarcts involving the left basal ganglia and left MCA territory, not significantly changed relative to prior MRI from 05/06/2012. 3. No acute intracranial hemorrhage or new large vessel territory infarct identified.   Electronically Signed   By: Jeannine Boga M.D.   On: 05/07/2013 06:18   Ct Head Wo Contrast  05/06/2013   CLINICAL DATA:  Code stroke, right arm weakness.  EXAM: CT HEAD WITHOUT CONTRAST   TECHNIQUE: Contiguous axial images were obtained from the base of the skull through the vertex without intravenous contrast.  COMPARISON:  SP ANGIO/CAR/CERV BI dated 06/08/2008; MR MRA NECK WO/W CM dated 03/31/2008; MR HEAD WO/W CM dated 03/31/2008  FINDINGS: No acute large vascular territory infarct. No hemorrhage. No midline shift or mass effect.  Colpocephaly, with partial agenesis of the corpus callosum. No hydrocephalus, apparent absent ventricular septum.  Fatty mass within the supra cerebellar cistern again seen consistent with lipoma. Cerebellar tonsils are at but not below the foramen magnum. No abnormal extra-axial fluid collections.  Paranasal sinuses and mastoid air cells appear well-aerated. No skull fracture. Ocular globes and orbital contents are unremarkable.  IMPRESSION:  No acute large vascular territory infarct or hemorrhage. Please note, for evaluation of acute ischemia, MRI with diffusion-weighted sequences would be more sensitive.  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  Findings discussed with and reconfirmed by Dr.DOUGLAS DELO on 05/06/2013 12:00 AM.   Electronically Signed   By: Elon Alas   On: 05/06/2013 00:09   Ct Angio Chest Pe W/cm &/or Wo Cm  05/14/2013   CLINICAL DATA:  Evaluate for pulmonary embolism. Shortness of breath. Patient with known systolic dysfunction by echocardiogram.  EXAM: CT ANGIOGRAPHY CHEST WITH CONTRAST  TECHNIQUE: Multidetector CT imaging of the chest was performed using the standard protocol during bolus administration of intravenous contrast. Multiplanar CT image reconstructions and MIPs were obtained to evaluate the vascular anatomy.  CONTRAST:  157mL OMNIPAQUE IOHEXOL 350 MG/ML SOLN  COMPARISON:  CTA chest 05/10/2013  FINDINGS: Satisfactory opacification of the pulmonary arteries. Negative for pulmonary embolism. The thoracic aorta is normal in caliber and is only faintly opacified at the time imaging. No gross evidence of  aortic dissection.  The heart is moderately enlarged. The left atrium, left ventricle, and right atrium appear dilated. Trace anterior pericardial effusion noted. Negative for pleural effusion. Prominent subcarinal lymph node is unchanged, measuring up to 1.4 cm short axis. Mildly prominent by hilar lymph nodes are also stable. Esophagus is unremarkable.  There is intralobular septal thickening at the lung apices. There are mild ground-glass opacities in the left perihilar region. No focal consolidation. The trachea and mainstem bronchi are patent.  No acute bony abnormality identified.  Imaging of the upper abdomen demonstrates reflux of contrast into the inferior vena cava and hepatic veins, suggesting elevated right heart pressures.  Bilateral gynecomastia is noted.  Review of the MIP images confirms the above findings.  IMPRESSION: 1. Moderate cardiac enlargement with chamber dilatation and interstitial pulmonary edema. Cannot exclude mild airspace pulmonary edema in the left perihilar region. Reflux of contrast into the inferior vena cava and hepatic veins suggests elevated right heart pressures. 2. Unchanged mild mediastinal and bi hilar lymphadenopathy. 3. Negative for pulmonary embolism. 4. Bilateral gynecomastia.   Electronically Signed   By: Curlene Dolphin M.D.   On: 05/14/2013 17:46   Ct Angio Chest Pe W/cm &/or Wo Cm  05/10/2013   CLINICAL DATA:  Decreased O2 saturation.  Tachycardia.  EXAM: CT ANGIOGRAPHY CHEST WITH CONTRAST  TECHNIQUE: Multidetector CT imaging of the chest was performed using the standard protocol during bolus administration of intravenous contrast. Multiplanar CT image reconstructions and MIPs were obtained to evaluate the vascular anatomy.  CONTRAST:  155mL OMNIPAQUE IOHEXOL 350 MG/ML SOLN  COMPARISON:  Chest radiograph performed 05/07/2013  FINDINGS: There is no evidence of pulmonary embolus.  Small bilateral pleural effusions are noted. Mild interstitial prominence raises concern  for mild interstitial edema. Mild left basilar atelectasis is seen. There is no evidence of pneumothorax. No masses are identified; no abnormal focal contrast enhancement is seen.  The heart is enlarged. Prominent bilateral hilar nodes are seen, measuring up to 1.7 cm on the right and 1.2 cm on the left. These are of uncertain significance. Subcarinal and azygoesophageal recess nodes measure up to 1.4 cm in short axis. The mediastinum is otherwise unremarkable in appearance. Trace pericardial fluid remains within normal limits. The great vessels are grossly unremarkable in appearance. No axillary lymphadenopathy is seen. The visualized portions of the thyroid gland are unremarkable in appearance.  The visualized portions of the liver and spleen are unremarkable.  No acute osseous abnormalities are seen.  Review of the MIP images confirms the above findings.  IMPRESSION: 1. No evidence of pulmonary embolus. 2. Small bilateral pleural effusions noted. Mild interstitial prominence raises concern for mild interstitial edema. 3. Mild left basilar atelectasis seen. 4. Cardiomegaly noted. 5. Prominent mediastinal and bilateral hilar nodes, of uncertain significance. These measure up to 1.4 cm at the subcarinal region and 1.7 cm at the right hilum. Would correlate for evidence of underlying systemic process.   Electronically Signed   By: Garald Balding M.D.   On: 05/10/2013 03:31   Mr Jodene Nam Head Wo Contrast  05/08/2013   CLINICAL DATA:  Cerebral ischemia, status post endovascular treatment. Right-sided weakness.  EXAM: MRI HEAD WITHOUT CONTRAST  MRA HEAD WITHOUT CONTRAST  TECHNIQUE: Multiplanar, multiecho pulse sequences of the brain and surrounding structures were obtained without intravenous contrast. Angiographic images of the head were obtained using MRA technique without contrast.  COMPARISON:  CT HEAD W/O CM dated 05/07/2013; IR ANGIO INTRA EXTRACRAN SEL INTERNAL CAROTID UNI L MOD SED dated 05/06/2013; CT HEAD W/O CM  dated 05/05/2013; MR HEAD W/O CM dated 05/06/2013; SP ANGIO/CAR/CERV BI dated 06/08/2008  FINDINGS: MRI HEAD FINDINGS  Slight progression of left MCA territory infarction, now involving more of the basal ganglia, including the caudate, putamen, and inferior globus pallidus as compared to the pretreatment MR 05/06/2013. Also new scattered areas of involvement of the left frontal cortex and subcortical white matter, left posterior temporal cortex, and left insular region. Gradient sequence demonstrates no significant hemorrhagic transformation. No midline shift.  Unchanged collicular plate lipoma. Negative orbits, sinuses, and mastoids.  MRA HEAD FINDINGS  There is gross patency of both internal carotid arteries. The left is dominant. Both anterior cerebrals fill from the left due to an atretic right A1 ACA. Fusiform prominence left ICA terminus appears similar to prior angiographic films. Basilar artery widely patent with both vertebrals contributing. No right MCA stenosis or occlusion. No intracranial aneurysm. Both posterior cerebral arteries are widely patent.  There is no visible flow related enhancement of the left MCA proximally or distally. The left MCA proximally appears to have re-occluded.  IMPRESSION: Slight progression of intracranial multifocal left MCA territory infarcts, as described above. No hemorrhage or midline shift.  No appreciable flow related enhancement in the left MCA proximally or distally, suspect re-occlusion of the left MCA ; see discussion above.   Electronically Signed   By: Rolla Flatten M.D.   On: 05/08/2013 10:11   Mr Brain Wo Contrast  05/08/2013   CLINICAL DATA:  Cerebral ischemia, status post endovascular treatment. Right-sided weakness.  EXAM: MRI HEAD WITHOUT CONTRAST  MRA HEAD WITHOUT CONTRAST  TECHNIQUE: Multiplanar, multiecho pulse sequences of the brain and surrounding structures were obtained without intravenous contrast. Angiographic images of the head were obtained using MRA  technique without contrast.  COMPARISON:  CT HEAD W/O CM dated 05/07/2013; IR ANGIO INTRA EXTRACRAN SEL INTERNAL CAROTID UNI L MOD SED dated 05/06/2013; CT HEAD W/O CM dated 05/05/2013; MR HEAD W/O CM dated 05/06/2013; SP ANGIO/CAR/CERV BI dated 06/08/2008  FINDINGS: MRI HEAD FINDINGS  Slight progression of left MCA territory infarction, now involving more of the basal ganglia, including the caudate, putamen, and inferior globus pallidus as compared to the pretreatment MR 05/06/2013. Also new scattered areas of involvement of the left frontal cortex and subcortical white matter, left posterior temporal cortex, and left insular region. Gradient sequence demonstrates no significant hemorrhagic transformation. No midline shift.  Unchanged collicular plate  lipoma. Negative orbits, sinuses, and mastoids.  MRA HEAD FINDINGS  There is gross patency of both internal carotid arteries. The left is dominant. Both anterior cerebrals fill from the left due to an atretic right A1 ACA. Fusiform prominence left ICA terminus appears similar to prior angiographic films. Basilar artery widely patent with both vertebrals contributing. No right MCA stenosis or occlusion. No intracranial aneurysm. Both posterior cerebral arteries are widely patent.  There is no visible flow related enhancement of the left MCA proximally or distally. The left MCA proximally appears to have re-occluded.  IMPRESSION: Slight progression of intracranial multifocal left MCA territory infarcts, as described above. No hemorrhage or midline shift.  No appreciable flow related enhancement in the left MCA proximally or distally, suspect re-occlusion of the left MCA ; see discussion above.   Electronically Signed   By: Rolla Flatten M.D.   On: 05/08/2013 10:11   Mr Brain Wo Contrast  05/06/2013   CLINICAL DATA:  Stroke, 24 hr status post tPA. Right-sided weakness.  EXAM: MRI HEAD WITHOUT CONTRAST  MRA HEAD WITHOUT CONTRAST  TECHNIQUE: Multiplanar, multiecho pulse sequences  of the brain and surrounding structures were obtained without intravenous contrast. Angiographic images of the head were obtained using MRA technique without contrast.  COMPARISON:  Prior CT from 05/05/2013 and MRI from 03/31/2008.  FINDINGS: MRI HEAD FINDINGS  The CSF containing spaces are within normal limits for patient age. No focal parenchymal signal abnormality is identified. 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 No other mass lesion identified. There is no midline shift, or extra-axial fluid collection. Ventricles are normal in size without evidence of hydrocephalus. The absence of the septum pellucidum noted.  There is abnormal restricted diffusion involving the left basal ganglia, most prevalent within the posterior aspect of the putamen. There is some involvement of the periventricular white matter anterior to the atrium of the left lateral ventricle. Scattered foci of restricted diffusion also seen within the insular cortex on the left bowel as well as the left temporoparietal region (series 3, image 19, 20). Additional small focus of restricted diffusion seen within the left centrum semi ovale of the left frontoparietal region (series 3, image 25). No evidence of hemorrhagic conversion. Tiny foci of hypointense signal intensity seen within the left putamen on GRE sequence likely represent associated petechial hemorrhages.  The cervicomedullary junction is normal. Pituitary gland is within normal limits. 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 Pituitary stalk is midline. The globes and optic nerves demonstrate a normal appearance with normal signal intensity. The  The bone marrow signal intensity is normal. Calvarium is intact. Visualized upper cervical spine is within normal limits.  Scalp soft tissues are unremarkable.  Paranasal sinuses are clear.  No mastoid effusion.  MRA HEAD FINDINGS  The visualized distal  cervical, petrous, cavernous, and supra clinoid segments of the internal carotid arteries are widely patent bilaterally with antegrade flow. The left A1 segment is widely patent. The right A1 segment is hypoplastic. Anterior communicating artery is normal. Anterior cerebral arteries are well opacified bilaterally.  There is focal proximal occlusion of the left M1 segment (series 6, image 71). No opacification of the left MCA territory artery branches seen distally.  The right M1 segment is well opacified with widely patent antegrade flow. Distal right MCA territory branches are within normal limits.  The left vertebral artery is dominant. Vertebral arteries are well opacified with widely patent antegrade flow. Posterior inferior  cerebral arteries are within normal limits. Vertebrobasilar junction and basilar artery are normal. Posterior cerebral arteries are well opacified. Superior cerebellar and anterior inferior cerebellar arteries are normal.  No intracranial aneurysm identified.  IMPRESSION: MRI BRAIN:  1. 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 No evidence of hemorrhagic conversion. 2. 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 MRA BRAIN:  1. Complete occlusion of the proximal left M1 segment. 2. No other proximal branch occlusion or high-grade flow-limiting stenosis identified within the intracranial circulation. 3. No intracranial aneurysm.  Critical Value/emergent results were called by telephone at the time of interpretation on 05/06/2013 at 11:00 PM to Dr. Roland Rack , who verbally acknowledged these results.   Electronically Signed   By: Jeannine Boga M.D.   On: 05/06/2013 23:13   Ir Thrombect Prim Mech Init (inclu) Mod Sed  05/20/2013   CLINICAL DATA:  Acute onset of right-sided paralysis, right facial droop and abnormal MRI/MRA of the brain.  EXAM: IR ANGIO INTRA EXTRACRAN SEL INTERNAL CAROTID UNI LEFT MOD  SED  ANESTHESIA/SEDATION: General anesthesia.  MEDICATIONS: As per general anesthesia.  CONTRAST:  80 mL OMNIPAQUE IOHEXOL 300 MG/ML SOLN  PROCEDURE: Following a full explanation of the procedure along with the potential associated complications, an informed witnessed consent was obtained.  The right groin was prepped and draped in the usual sterile fashion. Thereafter using modified Seldinger technique, transfemoral access into the right common femoral artery was obtained without difficulty. Over a 0.035 inch guidewire, a 5 French Pinnacle sheath was inserted. Through this, and also over a 0.035 inch guidewire, 5 French JB1 catheter was advanced to the aortic arch region and selectively positioned in the left common carotid artery.  An arteriogram was then performed centered over the carotid bifurcation intracranially. There were no acute complications. The patient tolerated the procedure well.  COMPLICATIONS: None immediate.  FINDINGS: The left common carotid arteriogram demonstrates the left external carotid artery and its major branches to be normal.  The left internal carotid artery at the bulb to the cranial skull base opacifies normally.  The petrous, the cavernous and the supraclinoid segments opacify normally.  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  The left anterior cerebral artery is seen to opacify normally into the capillary and the venous phases.  Delayed capillary phase demonstrates partial attempt at leptomeningeal collateralization of the left MCA perisylvian distribution, and the left parietal cortical subcortical region. Cross opacification via the anterior communicating artery of the right anterior cerebral A2 segment and distally is seen. The anterior choroidal artery is also normally opacified.  The angiographic findings were reviewed with the referring neurologist.  The option of endovascular revascularization in attempt to rescue at risk  left cerebral hemispheric brain tissue was discussed with the patient's spouse. The procedure, the reasons, the risks, benefits were all reviewed in detail.  The risk of intracranial hemorrhage of 10-15%, worsening neurological deficit, inability to revascularize, ventilator dependency and death were reviewed in detail.  After informed consent, the patient was put under general anesthesia by the Department of Anesthesiology at [place] catheter in the left common carotid artery was exchanged over a 0.035 inch 300 cm Rosen exchange guidewire for a 8 French 55 cm Brite tip neurovascular sheath using biplane roadmap technique, and constant fluoroscopic guidance. Good aspiration was obtained from the side port of the Tuohy-Borst at the hub off  the 8 [place] sheath. General contrast injection demonstrated no evidence of spasm, dissections or of intraluminal filling defects. This was then connected to continuous heparinized saline infusion.  Over the [place] exchanged guidewire, an 8 [place] 95 cm Flowgate balloon guide catheter which had been prepped and purged with heparinized saline infusion and 75% contrast was then advanced and positioned proximal to the left carotid bifurcation. The guidewire was removed. Good aspiration was obtained from the hub of the 8 French balloon guide catheter.  A gentle contrast injection demonstrated no evidence of spasm, dissections or of intraluminal filling defects.  Over a 0.035 inch Roadrunner guidewire, using biplane roadmap technique and constant fluoroscopic guidance, the 8 [place] guide catheter was then advanced to the distal vertical segment of the left internal carotid artery. The guidewire was removed. Again good aspiration was obtained at the hub of the guide catheter. A control arteriogram performed centered over the intracranial compartment continued to demonstrate angiographically occluded left middle cerebral artery.  At this time in a coaxial  manner with constant heparinized saline infusion a combination of DAC 125 microcatheter and an 18L Merci microcatheter was advanced over a 0.014 inch Softip Synchro microguidewire through the distal end of the guide catheter in the left internal carotid artery.  With the micro guidewire leading with the J-tip configuration, the combination was navigated to the supraclinoid left ICA. The left middle cerebral artery was then entered with the micro guidewire with slight difficulty and advanced to the distal M3 segment of the inferior branch. This was then followed by the microcatheter. The guidewire was then retrieved. Good aspiration was obtained from the hub of the microcatheter. A control angiogram performed through the 8 [place] guide catheter demonstrated the extent of the occlusion which also involved the superior division.  At this time, approximately 2 mg of superselective intra-arterial Integrelin were infused over approximately 2 minutes via the microcatheter.  A solitaire FR 65mm x 40 mm stent retrieval device which had been prepped and purged with heparinized saline infusion was then advanced in a coaxial manner and with constant heparinized saline infusion using biplane roadmap technique and constant fluoroscopic guidance to the distal end of the Merci microcatheter.  This was then deployed in the usual fashion covering the occluded segment. The retrieval device was left deployed for approximately 3 minutes. In the interim, a control arteriogram was performed through the 8 [place] guide catheter demonstrated significantly improved flow through the left middle cerebral artery distally. There appeared to be a waist at the proximal 1/3 of the left middle cerebral artery. The stent retrieval device proximally was then captured into the microcatheter. The occlusion balloon was then deployed in the distal left internal carotid artery. The combination of the retrieval device, the microcatheter and the [facility] 125  microcatheter were then gently retrieved and removed as constant aspiration was applied using 60 mL syringe at the hub of the guide catheter.  Aspiration was continued as the balloon was deflated. Back bleed was allowed. The aspirate demonstrated no clots. A control arteriogram performed through the guide catheter demonstrated occluded left middle cerebral artery.  A second pass was then made with the micro catheter and deploying the Solitaire FR device optimally. Again after having left it deployed for 2 min, its proximal aspect was captured into the microcatheter. Again as mentioned above, the combination of the microcatheter with the retrieval device were then retrieved with proximal flow arrest and aspiration being performed with a 60 mL syringe. Aspirate again contained no clots.  A control arteriogram performed through the 8 [place] guide catheter demonstrated occluded left middle cerebral artery.  This prompted a third attempt with the Solitaire FR device as mentioned earlier.  At this time prior to the retrieval, the patient was given 2 mg of Integrelin into the left middle cerebral artery. Again the deployed combination of the retrieval device, the microcatheter were then gently retrieved with constant aspiration being applied with proximal flow arrest. Again the aspirate contained no filling defects.  A control arteriogram performed through the 8 [place] guide cathter again demonstrate occluded left middle cerebral artery.  A fourth attempt with a Trevo ProVue 3 mm x 20 mm stent retrieval device was then performed. Again after proximal flow arrest and proximal capture of the retrieval device, with constant aspiration, this combination was retrieved and removed. Back bleed was allowed. A control arteriogram performed through the 8 [place] guide catheter continued to demonstrate occluded left middle cerebral artery. It was now apparent that the patient had severe proximal left MCA stenosis with an acute  angulation.  After discussion with the referring neurologist, it was decided to place a rescue stent across the tight focal stenosis in order to facilitate patency of the tight focal stenosis of the left middle cerebral artery proximal.  At this time in a coaxial manner and with constant heparinized saline infusion, using biplane roadmap technique and constant fluoroscopic guidance, a 4.5 mm x 37 mm stent device were then advanced into and already positioned across the tight focal stenosis and into the distal left internal carotid artery terminus. Just prior to the placement of the enterprise stent, the patient was given an additional 1 mg of the superselective intra-arterial Integrelin over a minute. A control arteriogram performed following the placement of the stent reveal significantly improved flow through the left MCA distribution. No gross filling defects are seen.  Patient was also given 2000 units of IV heparin, and 650 mg of aspirin and 300 mg of Plavix via the NG tube prior to the placement of the stent.  The 8 [place] guide catheter and the 8 French neurovascular sheath were then retrieved and removed over a 0.035 inch guidewire for a 9 [place] Pinnacle sheath. This was then connected to continuous heparinized saline infusion. Throughout the procedure, there were no acute complications in terms of extravasation of contrast or, hemodynamic changes.  Patient was then transferred to the CT scanner for post procedural CT scan of the brain.  IMPRESSION: Status post endovascular complete revascularization of occluded left middle cerebral artery using 3 passes with the Solitaire FR retrieval device, and 1 pass with the Trevo ProVue 3 mm x 20 mm device, a total of 5 mg of superselective intra-arterial Integrelin, and with the placement of a rescue stent for recurrent left middle cerebral artery occlusion for a proximal severe stenosis.   Electronically Signed   By: Luanne Bras M.D.   On: 05/20/2013 15:13    Dg Chest Port 1 View  05/14/2013   CLINICAL DATA:  SOB; drop in HR  EXAM: PORTABLE CHEST - 1 VIEW  COMPARISON:  CT ANGIO CHEST W/CM &/OR WO/CM dated 05/10/2013; DG CHEST 1V PORT dated 05/07/2013  FINDINGS: Cardiac silhouette enlarged. Low lung volumes. Stable patchy areas of atelectasis. No new focal regions of consolidation nor new focal infiltrates. The osseous structures unremarkable. When compared to prior chest radiograph dated 05/07/2013 the endotracheal tube and NG tube have been removed.  IMPRESSION: No acute cardiopulmonary disease. Stable patchy areas of atelectasis.  Electronically Signed   By: Margaree Mackintosh M.D.   On: 05/14/2013 13:00   Dg Chest Port 1 View  05/07/2013   CLINICAL DATA:  Check endotracheal tube placement  EXAM: PORTABLE CHEST - 1 VIEW  COMPARISON:  05/06/2013  FINDINGS: An endotracheal tube is now seen 5.1 cm above the carina. A nasogastric catheter is noted although the tip is coned off than the inferior aspect of the film. Proximal side hole lies within the distal esophagus. Patchy atelectatic changes are noted  IMPRESSION: Status post endotracheal tube and nasogastric catheter placement. Patchy atelectatic changes are noted bilaterally.   Electronically Signed   By: Inez Catalina M.D.   On: 05/07/2013 08:53   Dg Abd Portable 1v  05/07/2013   CLINICAL DATA:  Orogastric tube insertion  EXAM: PORTABLE ABDOMEN - 1 VIEW  COMPARISON:  None.  FINDINGS: OG tube tip is in the proximal stomach. Side port at the GE junction. Normal bowel gas pattern. Dense left lower lobe collapse/consolidation noted obscuring the left hemidiaphragm.  IMPRESSION: OG tube proximal stomach.  This could be advanced 7 cm.  Dense left base consolidation/collapse   Electronically Signed   By: Daryll Brod M.D.   On: 05/07/2013 08:51   Mr Jodene Nam Head/brain Wo Cm  05/06/2013   CLINICAL DATA:  Stroke, 24 hr status post tPA. Right-sided weakness.  EXAM: MRI HEAD WITHOUT CONTRAST  MRA HEAD WITHOUT CONTRAST  TECHNIQUE:  Multiplanar, multiecho pulse sequences of the brain and surrounding structures were obtained without intravenous contrast. Angiographic images of the head were obtained using MRA technique without contrast.  COMPARISON:  Prior CT from 05/05/2013 and MRI from 03/31/2008.  FINDINGS: MRI HEAD FINDINGS  The CSF containing spaces are within normal limits for patient age. No focal parenchymal signal abnormality is identified. 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 No other mass lesion identified. There is no midline shift, or extra-axial fluid collection. Ventricles are normal in size without evidence of hydrocephalus. The absence of the septum pellucidum noted.  There is abnormal restricted diffusion involving the left basal ganglia, most prevalent within the posterior aspect of the putamen. There is some involvement of the periventricular white matter anterior to the atrium of the left lateral ventricle. Scattered foci of restricted diffusion also seen within the insular cortex on the left bowel as well as the left temporoparietal region (series 3, image 19, 20). Additional small focus of restricted diffusion seen within the left centrum semi ovale of the left frontoparietal region (series 3, image 25). No evidence of hemorrhagic conversion. Tiny foci of hypointense signal intensity seen within the left putamen on GRE sequence likely represent associated petechial hemorrhages.  The cervicomedullary junction is normal. Pituitary gland is within normal limits. 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 Pituitary stalk is midline. The globes and optic nerves demonstrate a normal appearance with normal signal intensity. The  The bone marrow signal intensity is normal. Calvarium is intact. Visualized upper cervical spine is within normal limits.  Scalp soft tissues are unremarkable.  Paranasal sinuses are clear.  No mastoid effusion.  MRA HEAD  FINDINGS  The visualized distal cervical, petrous, cavernous, and supra clinoid segments of the internal carotid arteries are widely patent bilaterally with antegrade flow. The left A1 segment is widely patent. The right A1 segment is hypoplastic. Anterior communicating artery is normal. Anterior cerebral arteries are well opacified bilaterally.  There is focal proximal occlusion of the left M1  segment (series 6, image 71). No opacification of the left MCA territory artery branches seen distally.  The right M1 segment is well opacified with widely patent antegrade flow. Distal right MCA territory branches are within normal limits.  The left vertebral artery is dominant. Vertebral arteries are well opacified with widely patent antegrade flow. Posterior inferior cerebral arteries are within normal limits. Vertebrobasilar junction and basilar artery are normal. Posterior cerebral arteries are well opacified. Superior cerebellar and anterior inferior cerebellar arteries are normal.  No intracranial aneurysm identified.  IMPRESSION: MRI BRAIN:  1. 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 No evidence of hemorrhagic conversion. 2. 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 MRA BRAIN:  1. Complete occlusion of the proximal left M1 segment. 2. No other proximal branch occlusion or high-grade flow-limiting stenosis identified within the intracranial circulation. 3. No intracranial aneurysm.  Critical Value/emergent results were called by telephone at the time of interpretation on 05/06/2013 at 11:00 PM to Dr. Roland Rack , who verbally acknowledged these results.   Electronically Signed   By: Jeannine Boga M.D.   On: 05/06/2013 23:13   Ir Angio Intra Extracran Sel Internal Carotid Uni L Mod Sed  05/10/2013   CLINICAL DATA:  Acute onset of right-sided paralysis, right facial droop and abnormal MRI/MRA of the brain.  EXAM: IR ANGIO INTRA  EXTRACRAN SEL INTERNAL CAROTID UNI LEFT MOD SED  ANESTHESIA/SEDATION: General anesthesia.  MEDICATIONS: As per general anesthesia.  CONTRAST:  80 mL OMNIPAQUE IOHEXOL 300 MG/ML  SOLN  PROCEDURE: Following a full explanation of the procedure along with the potential associated complications, an informed witnessed consent was obtained.  The right groin was prepped and draped in the usual sterile fashion. Thereafter using modified Seldinger technique, transfemoral access into the right common femoral artery was obtained without difficulty. Over a 0.035 inch guidewire, a 5 French Pinnacle sheath was inserted. Through this, and also over a 0.035 inch guidewire, 5 French JB1 catheter was advanced to the aortic arch region and selectively positioned in the left common carotid artery.  An arteriogram was then performed centered over the carotid bifurcation intracranially. There were no acute complications. The patient tolerated the procedure well.  COMPLICATIONS: None immediate.  FINDINGS: The left common carotid arteriogram demonstrates the left external carotid artery and its major branches to be normal.  The left internal carotid artery at the bulb to the cranial skull base opacifies normally.  The petrous, the cavernous and the supraclinoid segments opacify normally.  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  The left anterior cerebral artery is seen to opacify normally into the capillary and the venous phases.  Delayed capillary phase demonstrates partial attempt at leptomeningeal collateralization of the left MCA perisylvian distribution, and the left parietal cortical subcortical region. Cross opacification via the anterior communicating artery of the right anterior cerebral A2 segment and distally is seen. The anterior choroidal artery is also normally opacified.  The angiographic findings were reviewed with the referring neurologist.  The option of endovascular  revascularization in attempt to rescue at risk left cerebral hemispheric brain tissue was discussed with the patient's spouse. The procedure, the reasons, the risks, benefits were all reviewed in detail.  The risk of intracranial hemorrhage of 10-15%, worsening neurological deficit, inability to revascularize, ventilator dependency and death were reviewed in detail.  After informed consent, the patient was put under general anesthesia by the Department  of Anesthesiology at [place] catheter in the left common carotid artery was exchanged over a 0.035 inch 300 cm Rosen exchange guidewire for a 8 [place] 55 cm Brite tip neurovascular sheath using biplane roadmap technique, and constant fluoroscopic guidance. Good aspiration was obtained from the side port of the Tuohy-Borst at the hub off the 8 [place] sheath. General contrast injection demonstrated no evidence of spasm, dissections or of intraluminal filling defects. This was then connected to continuous heparinized saline infusion.  Over the [place] exchanged guidewire, an 8 [place] 95 cm Flowgate balloon guide catheter which had been prepped and purged with heparinized saline infusion and 75% contrast was then advanced and positioned proximal to the left carotid bifurcation. The guidewire was removed. Good aspiration was obtained from the hub of the 8 [place] balloon guide catheter.  A gentle contrast injection demonstrated no evidence of spasm, dissections or of intraluminal filling defects.  Over a 0.035 inch Roadrunner guidewire, using biplane roadmap technique and constant fluoroscopic guidance, the 8 [place] guide catheter was then advanced to the distal vertical segment of the left internal carotid artery. The guidewire was removed. Again good aspiration was obtained at the hub of the guide catheter. A control arteriogram performed centered over the intracranial compartment continued to demonstrate angiographically occluded left  middle cerebral artery.  At this time in a coaxial manner with constant heparinized saline infusion a combination of DAC 125 microcatheter and an 18L Merci microcatheter was advanced over a 0.014 inch Softip Synchro microguidewire through the distal end of the guide catheter in the left internal carotid artery.  With the micro guidewire leading with the J-tip configuration, the combination was navigated to the supraclinoid left ICA. The left middle cerebral artery was then entered with the micro guidewire with slight difficulty and advanced to the distal M3 segment of the inferior branch. This was then followed by the microcatheter. The guidewire was then retrieved. Good aspiration was obtained from the hub of the microcatheter. A control angiogram performed through the 8 [place] guide catheter demonstrated the extent of the occlusion which also involved the superior division.  At this time, approximately 2 mg of superselective intra-arterial Integrelin were infused over approximately 2 minutes via the microcatheter.  A solitaire FR 60mm x 40 mm stent retrieval device which had been prepped and purged with heparinized saline infusion was then advanced in a coaxial manner and with constant heparinized saline infusion using biplane roadmap technique and constant fluoroscopic guidance to the distal end of the Merci microcatheter.  This was then deployed in the usual fashion covering the occluded segment. The retrieval device was left deployed for approximately 3 minutes. In the interim, a control arteriogram was performed through the 8 [place] guide catheter demonstrated significantly improved flow through the left middle cerebral artery distally. There appeared to be a waist at the proximal 1/3 of the left middle cerebral artery. The stent retrieval device proximally was then captured into the microcatheter. The occlusion balloon was then deployed in the distal left internal carotid artery. The combination of the  retrieval device, the microcatheter and the [facility] 125 microcatheter were then gently retrieved and removed as constant aspiration was applied using 60 mL syringe at the hub of the guide catheter.  Aspiration was continued as the balloon was deflated. Back bleed was allowed. The aspirate demonstrated no clots. A control arteriogram performed through the guide catheter demonstrated occluded left middle cerebral artery.  A second pass was then made with  the micro catheter and deploying the Solitaire FR device optimally. Again after having left it deployed for 2 min, its proximal aspect was captured into the microcatheter. Again as mentioned above, the combination of the microcatheter with the retrieval device were then retrieved with proximal flow arrest and aspiration being performed with a 60 mL syringe. Aspirate again contained no clots. A control arteriogram performed through the 8 [place] guide catheter demonstrated occluded left middle cerebral artery.  This prompted a third attempt with the Solitaire FR device as mentioned earlier.  At this time prior to the retrieval, the patient was given 2 mg of Integrelin into the left middle cerebral artery. Again the deployed combination of the retrieval device, the microcatheter were then gently retrieved with constant aspiration being applied with proximal flow arrest. Again the aspirate contained no filling defects.  A control arteriogram performed through the 8 [place] guide cathter again demonstrate occluded left middle cerebral artery.  A fourth attempt with a Trevo ProVue 3 mm x 20 mm stent retrieval device was then performed. Again after proximal flow arrest and proximal capture of the retrieval device, with constant aspiration, this combination was retrieved and removed. Back bleed was allowed. A control arteriogram performed through the 8 [place] guide catheter continued to demonstrate occluded left middle cerebral artery. It was now apparent that the patient had  severe proximal left MCA stenosis with an acute angulation.  After discussion with the referring neurologist, it was decided to place a rescue stent across the tight focal stenosis in order to facilitate patency of the tight focal stenosis of the left middle cerebral artery proximal.  At this time in a coaxial manner and with constant heparinized saline infusion, using biplane roadmap technique and constant fluoroscopic guidance, a 4.5 mm x 37 mm stent device were then advanced into and already positioned across the tight focal stenosis and into the distal left internal carotid artery terminus. Just prior to the placement of the enterprise stent, the patient was given an additional 1 mg of the superselective intra-arterial Integrelin over a minute. A control arteriogram performed following the placement of the stent reveal significantly improved flow through the left MCA distribution. No gross filling defects are seen.  Patient was also given 2000 units of IV heparin, and 650 mg of aspirin and 300 mg of Plavix via the NG tube prior to the placement of the stent.  The 8 [place] guide catheter and the 8 French neurovascular sheath were then retrieved and removed over a 0.035 inch guidewire for a 9 [place] Pinnacle sheath. This was then connected to continuous heparinized saline infusion. Throughout the procedure, there were no acute complications in terms of extravasation of contrast or, hemodynamic changes.  Patient was then transferred to the CT scanner for post procedural CT scan of the brain.  IMPRESSION: Status post endovascular complete revascularization of occluded left middle cerebral artery using 3 passes with the Solitaire FR retrieval device, and 1 pass with the Trevo ProVue 3 mm x 20 mm device, a total of 5 mg of superselective intra-arterial Integrelin, and with the placement of a rescue stent for recurrent left middle cerebral artery occlusion for a proximal severe stenosis.   Electronically Signed   By:  Luanne Bras M.D.   On: 05/09/2013 15:10   Ir Neuro Each Add'l After Basic Uni Left (ms)  05/20/2013   CLINICAL DATA:  Acute onset of right-sided paralysis, right facial droop and abnormal MRI/MRA of the brain.  EXAM: IR ANGIO INTRA EXTRACRAN  SEL INTERNAL CAROTID UNI LEFT MOD SED  ANESTHESIA/SEDATION: General anesthesia.  MEDICATIONS: As per general anesthesia.  CONTRAST:  80 mL OMNIPAQUE IOHEXOL 300 MG/ML SOLN  PROCEDURE: Following a full explanation of the procedure along with the potential associated complications, an informed witnessed consent was obtained.  The right groin was prepped and draped in the usual sterile fashion. Thereafter using modified Seldinger technique, transfemoral access into the right common femoral artery was obtained without difficulty. Over a 0.035 inch guidewire, a 5 French Pinnacle sheath was inserted. Through this, and also over a 0.035 inch guidewire, 5 French JB1 catheter was advanced to the aortic arch region and selectively positioned in the left common carotid artery.  An arteriogram was then performed centered over the carotid bifurcation intracranially. There were no acute complications. The patient tolerated the procedure well.  COMPLICATIONS: None immediate.  FINDINGS: The left common carotid arteriogram demonstrates the left external carotid artery and its major branches to be normal.  The left internal carotid artery at the bulb to the cranial skull base opacifies normally.  The petrous, the cavernous and the supraclinoid segments opacify normally.  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  The left anterior cerebral artery is seen to opacify normally into the capillary and the venous phases.  Delayed capillary phase demonstrates partial attempt at leptomeningeal collateralization of the left MCA perisylvian distribution, and the left parietal cortical subcortical region. Cross opacification via the anterior  communicating artery of the right anterior cerebral A2 segment and distally is seen. The anterior choroidal artery is also normally opacified.  The angiographic findings were reviewed with the referring neurologist.  The option of endovascular revascularization in attempt to rescue at risk left cerebral hemispheric brain tissue was discussed with the patient's spouse. The procedure, the reasons, the risks, benefits were all reviewed in detail.  The risk of intracranial hemorrhage of 10-15%, worsening neurological deficit, inability to revascularize, ventilator dependency and death were reviewed in detail.  After informed consent, the patient was put under general anesthesia by the Department of Anesthesiology at [place] catheter in the left common carotid artery was exchanged over a 0.035 inch 300 cm Rosen exchange guidewire for a 8 [place] 55 cm Brite tip neurovascular sheath using biplane roadmap technique, and constant fluoroscopic guidance. Good aspiration was obtained from the side port of the Tuohy-Borst at the hub off the 8 [place] sheath. General contrast injection demonstrated no evidence of spasm, dissections or of intraluminal filling defects. This was then connected to continuous heparinized saline infusion.  Over the [place] exchanged guidewire, an 8 [place] 95 cm Flowgate balloon guide catheter which had been prepped and purged with heparinized saline infusion and 75% contrast was then advanced and positioned proximal to the left carotid bifurcation. The guidewire was removed. Good aspiration was obtained from the hub of the 8 French balloon guide catheter.  A gentle contrast injection demonstrated no evidence of spasm, dissections or of intraluminal filling defects.  Over a 0.035 inch Roadrunner guidewire, using biplane roadmap technique and constant fluoroscopic guidance, the 8 [place] guide catheter was then advanced to the distal vertical segment of the left internal  carotid artery. The guidewire was removed. Again good aspiration was obtained at the hub of the guide catheter. A control arteriogram performed centered over the intracranial compartment continued to demonstrate angiographically occluded left middle cerebral artery.  At this time in a coaxial manner with constant heparinized saline infusion  a combination of DAC 125 microcatheter and an 18L Merci microcatheter was advanced over a 0.014 inch Softip Synchro microguidewire through the distal end of the guide catheter in the left internal carotid artery.  With the micro guidewire leading with the J-tip configuration, the combination was navigated to the supraclinoid left ICA. The left middle cerebral artery was then entered with the micro guidewire with slight difficulty and advanced to the distal M3 segment of the inferior branch. This was then followed by the microcatheter. The guidewire was then retrieved. Good aspiration was obtained from the hub of the microcatheter. A control angiogram performed through the 8 [place] guide catheter demonstrated the extent of the occlusion which also involved the superior division.  At this time, approximately 2 mg of superselective intra-arterial Integrelin were infused over approximately 2 minutes via the microcatheter.  A solitaire FR 88mm x 40 mm stent retrieval device which had been prepped and purged with heparinized saline infusion was then advanced in a coaxial manner and with constant heparinized saline infusion using biplane roadmap technique and constant fluoroscopic guidance to the distal end of the Merci microcatheter.  This was then deployed in the usual fashion covering the occluded segment. The retrieval device was left deployed for approximately 3 minutes. In the interim, a control arteriogram was performed through the 8 [place] guide catheter demonstrated significantly improved flow through the left middle cerebral artery distally. There appeared to be a waist at the  proximal 1/3 of the left middle cerebral artery. The stent retrieval device proximally was then captured into the microcatheter. The occlusion balloon was then deployed in the distal left internal carotid artery. The combination of the retrieval device, the microcatheter and the [facility] 125 microcatheter were then gently retrieved and removed as constant aspiration was applied using 60 mL syringe at the hub of the guide catheter.  Aspiration was continued as the balloon was deflated. Back bleed was allowed. The aspirate demonstrated no clots. A control arteriogram performed through the guide catheter demonstrated occluded left middle cerebral artery.  A second pass was then made with the micro catheter and deploying the Solitaire FR device optimally. Again after having left it deployed for 2 min, its proximal aspect was captured into the microcatheter. Again as mentioned above, the combination of the microcatheter with the retrieval device were then retrieved with proximal flow arrest and aspiration being performed with a 60 mL syringe. Aspirate again contained no clots. A control arteriogram performed through the 8 [place] guide catheter demonstrated occluded left middle cerebral artery.  This prompted a third attempt with the Solitaire FR device as mentioned earlier.  At this time prior to the retrieval, the patient was given 2 mg of Integrelin into the left middle cerebral artery. Again the deployed combination of the retrieval device, the microcatheter were then gently retrieved with constant aspiration being applied with proximal flow arrest. Again the aspirate contained no filling defects.  A control arteriogram performed through the 8 [place] guide cathter again demonstrate occluded left middle cerebral artery.  A fourth attempt with a Trevo ProVue 3 mm x 20 mm stent retrieval device was then performed. Again after proximal flow arrest and proximal capture of the retrieval device, with constant aspiration, this  combination was retrieved and removed. Back bleed was allowed. A control arteriogram performed through the 8 [place] guide catheter continued to demonstrate occluded left middle cerebral artery. It was now apparent that the patient had severe proximal left MCA stenosis with an acute angulation.  After  discussion with the referring neurologist, it was decided to place a rescue stent across the tight focal stenosis in order to facilitate patency of the tight focal stenosis of the left middle cerebral artery proximal.  At this time in a coaxial manner and with constant heparinized saline infusion, using biplane roadmap technique and constant fluoroscopic guidance, a 4.5 mm x 37 mm stent device were then advanced into and already positioned across the tight focal stenosis and into the distal left internal carotid artery terminus. Just prior to the placement of the enterprise stent, the patient was given an additional 1 mg of the superselective intra-arterial Integrelin over a minute. A control arteriogram performed following the placement of the stent reveal significantly improved flow through the left MCA distribution. No gross filling defects are seen.  Patient was also given 2000 units of IV heparin, and 650 mg of aspirin and 300 mg of Plavix via the NG tube prior to the placement of the stent.  The 8 [place] guide catheter and the 8 French neurovascular sheath were then retrieved and removed over a 0.035 inch guidewire for a 9 [place] Pinnacle sheath. This was then connected to continuous heparinized saline infusion. Throughout the procedure, there were no acute complications in terms of extravasation of contrast or, hemodynamic changes.  Patient was then transferred to the CT scanner for post procedural CT scan of the brain.  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   Electronically Signed   By: Luanne Bras M.D.   On: 05/20/2013 15:13    ASSESSMENT AND PLAN 1. Acute systolic heart failure, improved.  2. 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  On aspirin and Plavix. 3. Dilated cardiomyopathy EF 25%   Plan:Left and Right heart cath Monday.  Will update labs.    Signed, Darlin Coco MD

## 2013-05-22 NOTE — Progress Notes (Signed)
Patient accidentally ingested dairy this AM and reports being lactose intolerant.  Multiple BM's recorded in daily flowsheet.

## 2013-05-22 NOTE — Progress Notes (Signed)
Subjective/Complaints: [name] with history of CHF, stomach ulcers, sciatica LLE, brain lipoma; who was admitted to [facility] on 05/06/13 with right side weakness. He was treated with tPA and transferred to [facility] for further treatment. CT head negative for acute changes. 2D echo with EF 25% with diffuse hypokinesis and grade 2 diastolic dysfunction. Carotid dopplers without significant ICA stenosis. Patient with improvement after tPA . 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 Estanislado Pandy. He was evaluated by Dr. Aundra Dubin who who recommends heart cath prior to discharge to rule out CAD as cause of CM and expressed concerns that CVA could be cardioembolic due to low EF. Medications adjusted and . He was started on ASA and plavix for secondary stroke prevention given stent in place. He has had high levels of anxiety with SOB (hyperventilating?) requiring ativan. CTA of chest negative for PE but with evidence of nodal prominence of questionable etiology.  Slept well last night. Nausea better as a whole. Occasional dry heaves still Review of Systems -   Objective: Vital Signs: Blood pressure 114/80, pulse 76, temperature 97.9 F (36.6 C), temperature source Oral, resp. rate 18, height 6' (1.829 m), SpO2 100.00%. No results found. Results for orders placed during the hospital encounter of 05/12/13 (from the past 72 hour(s))  URINALYSIS, ROUTINE W REFLEX MICROSCOPIC     Status: Abnormal   Collection Time    05/19/13  9:07 AM      Result Value Ref Range   Color, Urine YELLOW  YELLOW   APPearance CLEAR   CLEAR   Specific Gravity, Urine 1.019  1.005 - 1.030   pH 6.5  5.0 - 8.0   Glucose, UA NEGATIVE  NEGATIVE mg/dL   Hgb urine dipstick NEGATIVE  NEGATIVE   Bilirubin Urine NEGATIVE  NEGATIVE   Ketones, ur NEGATIVE  NEGATIVE mg/dL   Protein, ur 100 (*) NEGATIVE mg/dL   Urobilinogen, UA 1.0  0.0 - 1.0 mg/dL   Nitrite NEGATIVE  NEGATIVE   Leukocytes, UA NEGATIVE  NEGATIVE  URINE CULTURE     Status: None   Collection Time    05/19/13  9:07 AM      Result Value Ref Range   Specimen Description URINE, RANDOM     Special Requests NONE     Culture  Setup Time       Value: 05/19/2013 10:28     Performed at [facility]       Value: 50,000 COLONIES/ML     Performed at [facility]   Culture       Value: [place]     Performed at [facility]   Report Status 05/21/2013 FINAL     Organism ID, Bacteria CITROBACTER KOSERI    URINE MICROSCOPIC-ADD ON     Status: None   Collection Time    05/19/13  9:07 AM      Result Value Ref Range   Squamous Epithelial / LPF RARE  RARE   WBC, UA 0-2  <3 WBC/hpf   RBC / HPF 0-2  <3 RBC/hpf   Bacteria, UA RARE  RARE  PRO B NATRIURETIC PEPTIDE     Status: Abnormal   Collection Time    05/20/13  7:14 AM      Result Value Ref Range   Pro B Natriuretic peptide (BNP) 2735.0 (*) 0 - 125 pg/mL  BASIC METABOLIC PANEL     Status: Abnormal   Collection Time    05/20/13  7:14 AM      Result Value Ref Range   Sodium 140  137 - 147 mEq/L   Potassium 4.5  3.7 - 5.3 mEq/L   Comment: DELTA CHECK NOTED   Chloride 104  96 - 112 mEq/L   CO2 24  19 - 32 mEq/L   Glucose, Bld 103 (*) 70 - 99 mg/dL   BUN 12  6 - 23 mg/dL   Creatinine, Ser 1.13  0.50 - 1.35 mg/dL   Calcium 9.3  8.4 - 10.5 mg/dL   GFR calc non Af Amer 75 (*) >90 mL/min   GFR calc Af Amer 87 (*) >90 mL/min   Comment: (NOTE)     The eGFR has been calculated using the CKD EPI equation.     This calculation has not been validated in all clinical  situations.     eGFR's persistently <90 mL/min signify possible Chronic Kidney     Disease.     HEENT: normal Cardio: RRR and no murmur Resp: CTA B/L and unlabored GI: BS positive and NT.ND Extremity:  Pulses positive and No Edema Skin:   Intact Musc/Skel:  Normal Right hemiparesis UE>LE. Mild sensory deficits on right side. Pt with right central 7 and tongue deviation. Speech dysarthric. Sensation intact to light touch in the upper and lower limbs. motor strength: RUE 3- delt,bi, 2- tri, grip. RLE 3- at HF,KE and KE/KF, 2-/5 at ankle   Assessment/Plan: 1. Functional deficits secondary to L MCA distribution infarct which require 3+ hours per day of interdisciplinary therapy in a comprehensive inpatient rehab setting. Physiatrist is providing close team supervision and 24 hour management of active medical problems listed below. Physiatrist and rehab team continue to assess barriers to discharge/monitor patient progress toward functional and medical goals.  FIM: FIM - Bathing Bathing Steps Patient Completed: Chest;Abdomen;Right upper leg;Left upper leg;Right Arm;Right lower leg (including foot);Left lower leg (including foot);Buttocks;Front perineal area Bathing: 4: Min-Patient completes 8-9 [id number] parts or 75+ percent  FIM - Upper Body Dressing/Undressing Upper body dressing/undressing steps patient completed: Put head through opening of pull over shirt/dress;Pull shirt over trunk;Thread/unthread left sleeve of pullover shirt/dress Upper body dressing/undressing: 4: Min-Patient completed 75 plus % of tasks FIM - Lower Body Dressing/Undressing Lower body dressing/undressing steps patient completed: Thread/unthread left underwear leg;Thread/unthread left pants leg;Pull underwear up/down;Don/Doff left sock;Don/Doff right sock Lower body dressing/undressing: 3: Mod-Patient completed 50-74% of tasks  FIM - Toileting Toileting steps completed by patient: Adjust clothing prior to toileting  (removed clothing completely) Toileting Assistive Devices: Grab bar or rail for support Toileting: 3: Mod-Patient completed 2 of 3 steps  FIM - TRadio producerDevices: Grab bars Toilet Transfers: 4-To toilet/BSC: Min A (steadying Pt. > 75%)  FIM - BControl and instrumentation engineerDevices: Orthosis;Arm rests (R AFO) Bed/Chair Transfer: 0: Activity did not occur  FIM - Locomotion: Wheelchair Distance: 60 Locomotion: Wheelchair: 2: Travels 50 - 149 ft with supervision, cueing or coaxing FIM - Locomotion: Ambulation Locomotion: Ambulation Assistive Devices: Orthosis;Other (comment) (hallway rail on L) Ambulation/Gait Assistance: 3: Mod assist Locomotion: Ambulation: 1: Travels less than 50 ft with moderate assistance (  Pt: 50 - 74%)  Comprehension Comprehension Mode: Auditory Comprehension: 5-Follows basic conversation/direction: With extra time/assistive device  Expression Expression Mode: Verbal Expression: 5-Expresses basic needs/ideas: With extra time/assistive device  Social Interaction Social Interaction: 5-Interacts appropriately 90% of the time - Needs monitoring or encouragement for participation or interaction.  Problem Solving Problem Solving: 5-Solves basic problems: With no assist  Memory Memory: 5-Recognizes or recalls 90% of the time/requires cueing < 10% of the time  Medical Problem List and Plan:  1. 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 Status post revascularization with clot retrieval  2. DVT Prophylaxis/Anticoagulation: SCDs. Monitor for any signs of DVT  3. Pain Management: Tylenol as needed  4. Mood/anxiety: Xanax 0.25 mg twice a day, Ativan 1 mg every 6 as as needed anxiety. Bed alarm for safety  5. Neuropsych: This patient is capable of making decisions on his own behalf.  6. Dysphagia. Dysphagia 2 thin liquids. Followup speech therapy  7.Hypertension. Cozaar  25 mg daily, Lasix 20 mg daily, Aldactone 12.5 mg daily. Monitor with increased mobility  8. Cardiomyopathy/CHF. Monitor for any signs of fluid overload. Followup cardiology services.  -PLAN IS NOW FOR HEART CATH ON Monday---PT WILL NEED TO RETURN TO ACUTE POST-PROCEDURE 9.  UTI- citrobacter sens Cefazolin--abx stopped 10.  Nausea: still occasional dry heaves but patient feels this is better.  - KUB negative. abx stopped  -likely anxiety component, cards notes an issue prior to admit  -split up protonix to bid schedule  -continue carafate and scheduled zofran as well     LOS (Days) 10 A FACE TO FACE EVALUATION WAS PERFORMED  Meredith Staggers 05/22/2013, 7:48 AM

## 2013-05-23 ENCOUNTER — Encounter (HOSPITAL_COMMUNITY)
Admission: AD | Disposition: A | Discharge status: Rehabilitation Facility | Payer: Self-pay | Source: Ambulatory Visit | Attending: Cardiovascular Disease

## 2013-05-23 ENCOUNTER — Encounter (HOSPITAL_COMMUNITY)
Admission: RE | Disposition: A | Discharge status: Home / Self Care | Payer: Self-pay | Source: Intra-hospital | Attending: Physical Medicine & Rehabilitation

## 2013-05-23 ENCOUNTER — Observation Stay (HOSPITAL_COMMUNITY)
Admission: AD | Admit: 2013-05-23 | Discharge: 2013-05-24 | Disposition: A | Discharge status: Rehabilitation Facility | Payer: BC Managed Care – PPO | Source: Ambulatory Visit | Attending: Cardiovascular Disease | Admitting: Cardiovascular Disease

## 2013-05-23 DIAGNOSIS — R29898 Other symptoms and signs involving the musculoskeletal system: Secondary | ICD-10-CM | POA: Insufficient documentation

## 2013-05-23 DIAGNOSIS — G4700 Insomnia, unspecified: Secondary | ICD-10-CM | POA: Insufficient documentation

## 2013-05-23 DIAGNOSIS — I10 Essential (primary) hypertension: Secondary | ICD-10-CM

## 2013-05-23 DIAGNOSIS — I429 Cardiomyopathy, unspecified: Secondary | ICD-10-CM

## 2013-05-23 DIAGNOSIS — F430 Acute stress reaction: Secondary | ICD-10-CM | POA: Insufficient documentation

## 2013-05-23 DIAGNOSIS — K219 Gastro-esophageal reflux disease without esophagitis: Secondary | ICD-10-CM | POA: Insufficient documentation

## 2013-05-23 DIAGNOSIS — I63519 Cerebral infarction due to unspecified occlusion or stenosis of unspecified middle cerebral artery: Secondary | ICD-10-CM

## 2013-05-23 DIAGNOSIS — I69922 Dysarthria following unspecified cerebrovascular disease: Secondary | ICD-10-CM

## 2013-05-23 DIAGNOSIS — I6350 Cerebral infarction due to unspecified occlusion or stenosis of unspecified cerebral artery: Secondary | ICD-10-CM

## 2013-05-23 DIAGNOSIS — I5020 Unspecified systolic (congestive) heart failure: Secondary | ICD-10-CM | POA: Insufficient documentation

## 2013-05-23 DIAGNOSIS — I69998 Other sequelae following unspecified cerebrovascular disease: Secondary | ICD-10-CM | POA: Insufficient documentation

## 2013-05-23 DIAGNOSIS — I69959 Hemiplegia and hemiparesis following unspecified cerebrovascular disease affecting unspecified side: Secondary | ICD-10-CM

## 2013-05-23 DIAGNOSIS — I639 Cerebral infarction, unspecified: Secondary | ICD-10-CM

## 2013-05-23 DIAGNOSIS — I5021 Acute systolic (congestive) heart failure: Secondary | ICD-10-CM

## 2013-05-23 DIAGNOSIS — I428 Other cardiomyopathies: Secondary | ICD-10-CM | POA: Diagnosis present

## 2013-05-23 DIAGNOSIS — I69991 Dysphagia following unspecified cerebrovascular disease: Secondary | ICD-10-CM

## 2013-05-23 DIAGNOSIS — F411 Generalized anxiety disorder: Secondary | ICD-10-CM | POA: Insufficient documentation

## 2013-05-23 DIAGNOSIS — I69920 Aphasia following unspecified cerebrovascular disease: Secondary | ICD-10-CM | POA: Insufficient documentation

## 2013-05-23 DIAGNOSIS — I509 Heart failure, unspecified: Principal | ICD-10-CM | POA: Insufficient documentation

## 2013-05-23 HISTORY — DX: Anxiety disorder, unspecified: F41.9

## 2013-05-23 HISTORY — PX: LEFT AND RIGHT HEART CATHETERIZATION WITH CORONARY ANGIOGRAM: SHX5449

## 2013-05-23 HISTORY — DX: Cerebral infarction, unspecified: I63.9

## 2013-05-23 HISTORY — DX: Shortness of breath: R06.02

## 2013-05-23 HISTORY — DX: Cardiomyopathy, unspecified: I42.9

## 2013-05-23 HISTORY — PX: CARDIAC CATHETERIZATION: SHX172

## 2013-05-23 LAB — CREATININE, SERUM
CREATININE: 1.11 mg/dL (ref 0.50–1.35)
GFR calc Af Amer: 88 mL/min — ABNORMAL LOW (ref >90)
GFR calc non Af Amer: 76 mL/min — ABNORMAL LOW (ref >90)

## 2013-05-23 LAB — CBC
HEMATOCRIT: 42.8 % (ref 39.0–52.0)
HEMOGLOBIN: 14.5 g/dL (ref 13.0–17.0)
MCH: 32.3 pg (ref 26.0–34.0)
MCHC: 33.9 g/dL (ref 30.0–36.0)
MCV: 95.3 fL (ref 78.0–100.0)
Platelets: 282 10*3/uL (ref 150–400)
RBC: 4.49 MIL/uL (ref 4.22–5.81)
RDW: 13.5 % (ref 11.5–15.5)
WBC: 4.7 10*3/uL (ref 4.0–10.5)

## 2013-05-23 LAB — POCT I-STAT 3, ART BLOOD GAS (G3+)
Acid-base deficit: 2 mmol/L (ref 0.0–2.0)
BICARBONATE: 22.5 meq/L (ref 20.0–24.0)
O2 Saturation: 96 %
PH ART: 7.399 (ref 7.350–7.450)
PO2 ART: 81 mmHg (ref 80.0–100.0)
TCO2: 24 mmol/L (ref 0–100)
pCO2 arterial: 36.4 mmHg (ref 35.0–45.0)

## 2013-05-23 LAB — POCT I-STAT 3, VENOUS BLOOD GAS (G3P V)
ACID-BASE DEFICIT: 1 mmol/L (ref 0.0–2.0)
BICARBONATE: 23.4 meq/L (ref 20.0–24.0)
O2 Saturation: 73 %
PO2 VEN: 39 mmHg (ref 30.0–45.0)
TCO2: 25 mmol/L (ref 0–100)
pCO2, Ven: 38.4 mmHg — ABNORMAL LOW (ref 45.0–50.0)
pH, Ven: 7.393 — ABNORMAL HIGH (ref 7.250–7.300)

## 2013-05-23 SURGERY — LEFT AND RIGHT HEART CATHETERIZATION WITH CORONARY ANGIOGRAM
Anesthesia: LOCAL | Laterality: Bilateral

## 2013-05-23 SURGERY — LEFT AND RIGHT HEART CATHETERIZATION WITH CORONARY ANGIOGRAM
Anesthesia: LOCAL

## 2013-05-23 MED ORDER — ASPIRIN 81 MG PO CHEW: 81.0000 mg | CHEWABLE_TABLET | Freq: Every day | ORAL | Status: DC

## 2013-05-23 MED ORDER — HEPARIN SODIUM (PORCINE) 1000 UNIT/ML IJ SOLN
INTRAMUSCULAR | Status: AC
  Filled 2013-05-23: qty 1

## 2013-05-23 MED ORDER — DIPHENHYDRAMINE HCL 50 MG/ML IJ SOLN
INTRAMUSCULAR | Status: AC
  Filled 2013-05-23: qty 1

## 2013-05-23 MED ORDER — SODIUM CHLORIDE 0.9 % IJ SOLN: 3.0000 mL | INTRAMUSCULAR | Status: DC | PRN

## 2013-05-23 MED ORDER — VERAPAMIL HCL 2.5 MG/ML IV SOLN
INTRAVENOUS | Status: AC
  Filled 2013-05-23: qty 2

## 2013-05-23 MED ORDER — ALPRAZOLAM 0.5 MG PO TABS
0.5000 mg | ORAL_TABLET | Freq: Three times a day (TID) | ORAL | Status: DC | PRN
  Administered 2013-05-23 – 2013-05-24 (×3): 0.5 mg via ORAL
  Filled 2013-05-23 (×4): qty 1

## 2013-05-23 MED ORDER — TRAMADOL HCL 50 MG PO TABS: 50.0000 mg | ORAL_TABLET | Freq: Four times a day (QID) | ORAL | Status: DC | PRN

## 2013-05-23 MED ORDER — ACETAMINOPHEN 325 MG PO TABS: 650.0000 mg | ORAL_TABLET | ORAL | Status: DC | PRN

## 2013-05-23 MED ORDER — FENTANYL CITRATE 0.05 MG/ML IJ SOLN
INTRAMUSCULAR | Status: AC
  Filled 2013-05-23: qty 2

## 2013-05-23 MED ORDER — CLOPIDOGREL BISULFATE 75 MG PO TABS
75.0000 mg | ORAL_TABLET | Freq: Every day | ORAL | Status: DC
  Administered 2013-05-24: 75 mg via ORAL
  Filled 2013-05-23: qty 1

## 2013-05-23 MED ORDER — HEPARIN (PORCINE) IN NACL 2-0.9 UNIT/ML-% IJ SOLN
INTRAMUSCULAR | Status: AC
  Filled 2013-05-23: qty 1000

## 2013-05-23 MED ORDER — SODIUM CHLORIDE 0.9 % IV SOLN
INTRAVENOUS | Status: AC
  Administered 2013-05-23: 19:00:00 via INTRAVENOUS

## 2013-05-23 MED ORDER — MIDAZOLAM HCL 2 MG/2ML IJ SOLN
INTRAMUSCULAR | Status: AC
  Filled 2013-05-23: qty 2

## 2013-05-23 MED ORDER — NITROGLYCERIN 0.2 MG/ML ON CALL CATH LAB
INTRAVENOUS | Status: AC
  Filled 2013-05-23: qty 1

## 2013-05-23 MED ORDER — ASPIRIN EC 81 MG PO TBEC
81.0000 mg | DELAYED_RELEASE_TABLET | Freq: Every day | ORAL | Status: DC
  Administered 2013-05-24: 10:00:00 81 mg via ORAL
  Filled 2013-05-23: qty 1

## 2013-05-23 MED ORDER — SODIUM CHLORIDE 0.9 % IJ SOLN: 3.0000 mL | Freq: Two times a day (BID) | INTRAMUSCULAR | Status: DC

## 2013-05-23 MED ORDER — NITROGLYCERIN 0.4 MG SL SUBL: 0.4000 mg | SUBLINGUAL_TABLET | SUBLINGUAL | Status: DC | PRN

## 2013-05-23 MED ORDER — SODIUM CHLORIDE 0.9 % IV SOLN: INTRAVENOUS | Status: DC

## 2013-05-23 MED ORDER — POTASSIUM CHLORIDE CRYS ER 20 MEQ PO TBCR
40.0000 meq | EXTENDED_RELEASE_TABLET | Freq: Every day | ORAL | Status: DC
  Administered 2013-05-24: 40 meq via ORAL
  Filled 2013-05-23: qty 2

## 2013-05-23 MED ORDER — PANTOPRAZOLE SODIUM 40 MG PO TBEC
80.0000 mg | DELAYED_RELEASE_TABLET | Freq: Every day | ORAL | Status: DC
  Administered 2013-05-24: 80 mg via ORAL
  Filled 2013-05-23: qty 2

## 2013-05-23 MED ORDER — HEPARIN SODIUM (PORCINE) 5000 UNIT/ML IJ SOLN
5000.0000 [IU] | Freq: Three times a day (TID) | INTRAMUSCULAR | Status: DC
  Administered 2013-05-24 (×3): 5000 [IU] via SUBCUTANEOUS
  Filled 2013-05-23 (×5): qty 1

## 2013-05-23 MED ORDER — SUCRALFATE 1 GM/10ML PO SUSP
1.0000 g | Freq: Two times a day (BID) | ORAL | Status: DC
  Administered 2013-05-23 – 2013-05-24 (×2): 1 g via ORAL
  Filled 2013-05-23 (×4): qty 10

## 2013-05-23 MED ORDER — LIDOCAINE HCL (PF) 1 % IJ SOLN
INTRAMUSCULAR | Status: AC
  Filled 2013-05-23: qty 30

## 2013-05-23 MED ORDER — SPIRONOLACTONE-HCTZ 25-25 MG PO TABS
0.5000 | ORAL_TABLET | Freq: Every day | ORAL | Status: DC
  Filled 2013-05-23: qty 1

## 2013-05-23 MED ORDER — FUROSEMIDE 40 MG PO TABS
40.0000 mg | ORAL_TABLET | Freq: Every day | ORAL | Status: DC
  Administered 2013-05-24: 10:00:00 40 mg via ORAL
  Filled 2013-05-23: qty 1

## 2013-05-23 MED ORDER — LOSARTAN POTASSIUM 25 MG PO TABS
25.0000 mg | ORAL_TABLET | Freq: Every day | ORAL | Status: DC
  Filled 2013-05-23: qty 1

## 2013-05-23 MED ORDER — DIPHENHYDRAMINE HCL 50 MG/ML IJ SOLN
25.0000 mg | Freq: Once | INTRAMUSCULAR | Status: AC
  Administered 2013-05-23: 25 mg via INTRAVENOUS

## 2013-05-23 MED ORDER — ONDANSETRON HCL 4 MG/2ML IJ SOLN: 4.0000 mg | Freq: Four times a day (QID) | INTRAMUSCULAR | Status: DC | PRN

## 2013-05-23 NOTE — Interval H&P Note (Signed)
History and Physical Interval Note:  05/23/2013 4:38 PM  Isaac Johnson  has presented today for surgery, with the diagnosis of CHF  The various methods of treatment have been discussed with the patient and family. After consideration of risks, benefits and other options for treatment, the patient has consented to  Procedure(s): LEFT AND RIGHT HEART CATHETERIZATION WITH CORONARY ANGIOGRAM (Bilateral) as a surgical intervention .  The patient's history has been reviewed, patient examined, no change in status, stable for surgery.  I have reviewed the patient's chart and labs.  Questions were answered to the patient's satisfaction.     Roberta Kelly

## 2013-05-23 NOTE — Progress Notes (Signed)
    Subjective:  No SOB, ready for cath today.  Objective:  Vital Signs in the last 24 hours: Temp:  [98.1 F (36.7 C)-98.5 F (36.9 C)] 98.5 F (36.9 C) (04/20 0524) Pulse Rate:  [69-88] 88 (04/20 0524) Resp:  [18-19] 19 (04/20 0524) BP: (113-132)/(78-84) 113/78 mmHg (04/20 0524) SpO2:  [94 %-100 %] 100 % (04/20 0524) Weight:  [161 lb 6 oz (73.2 kg)] 161 lb 6 oz (73.2 kg) (04/20 0524)  Intake/Output from previous day:  Intake/Output Summary (Last 24 hours) at 05/23/13 1203 Last data filed at 05/23/13 0900  Gross per 24 hour  Intake    960 ml  Output   1700 ml  Net   -740 ml    Physical Exam: General appearance: alert, cooperative, no distress and rt hemiparesis Lungs: decreased Rt base Heart: regular rate and rhythm and S3, S4 gallop   Rate: 84  Rhythm: indeterminate and not on tele  Lab Results:  Recent Labs  05/22/13 1420  WBC 4.4  HGB 13.7  PLT 263    Recent Labs  05/22/13 1420  NA 139  K 4.7  CL 102  CO2 26  GLUCOSE 100*  BUN 13  CREATININE 1.26   No results found for this basename: TROPONINI, CK, MB,  in the last 72 hours  Recent Labs  05/22/13 1420  INR 0.93    Imaging: Imaging results have been reviewed  Cardiac Studies: Echo 05/06/13- - Left ventricle: The cavity size was mildly dilated. Wall thickness was normal. The estimated ejection fraction was 25%. Diffuse hypokinesis. Features are consistent with a pseudonormal left ventricular filling pattern, with concomitant abnormal relaxation and increased filling pressure (grade 2 diastolic dysfunction). - Aortic valve: There was no stenosis. - Mitral valve: Trivial regurgitation. - Left atrium: The atrium was moderately dilated. - Right ventricle: The cavity size was normal. Systolic function was mildly reduced. - Right atrium: The atrium was mildly dilated. - Pulmonary arteries: No complete TR doppler jet so unable to estimate PA systolic pressure. - Systemic veins: IVC measured  2.3 cm with < 50% respirophasic variation, suggesting RA pressure 15 mmHg. - Pericardium, extracardiac: A trivial pericardial effusion was identified. Impressions:  - Mildly dilated LV with EF 25%, diffuse hypokinesis. Moderate diastolic dysfunction. Normal RV size with mildly decreased systolic function. No significant valvular abnormalities.    Assessment/Plan:  This is a 48 year old male  was admitted [facility] May 06, 2013, with right-sided weakness. He was treated with  tPA, transferred to [facility] for further treatment. Echocardiogram with ejection fraction of 25% with diffuse hypokinesis with grade 2 diastolic  Dysfunction. He is for diagnostic cath today to r/o CAD as a cuase of his cardiomyopathy.    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   Kerin Ransom PA-C [facility] [phone] 05/23/2013, 12:03 PM

## 2013-05-23 NOTE — Progress Notes (Signed)
I have seen and examined the patient along with Kerin Ransom, PA.  I have reviewed the chart, notes and new data.  I agree with PA/NP's note.  PLAN: Elective cath today for unexplained severe cardiomyopathy with suspicion for CAD, Note recent ischemic stroke history.  Sanda Klein, MD, [place] [phone] 05/23/2013, 12:18 PM

## 2013-05-23 NOTE — Progress Notes (Signed)
Pt being discharged from rehab and going to cath lab for a cardiac catheterization as scheduled. Wife has pt belongings.  Will transfer to acute after procedure for monitoring.

## 2013-05-23 NOTE — Progress Notes (Addendum)
Subjective/Complaints: [name] with history of CHF, stomach ulcers, sciatica LLE, brain lipoma; who was admitted to [facility] on 05/06/13 with right side weakness. He was treated with tPA and transferred to [facility] for further treatment. CT head negative for acute changes. 2D echo with EF 25% with diffuse hypokinesis and grade 2 diastolic dysfunction. Carotid dopplers without significant ICA stenosis. Patient with improvement after tPA . 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  Scheduled for R and Left heart catheterization today Coughing and gagging, feels like he needs to cough up domething in throat Reports no abd pain and appetite is ok No problems swallowing during meals Review of Systems -   Objective: Vital Signs: Blood pressure 113/78, pulse 88, temperature 98.5 F (36.9 C), temperature source Oral, resp. rate 19, height 6' (1.829 m), weight 73.2 kg (161 lb 6 oz), SpO2 100.00%. No results found. Results for orders placed during the hospital encounter of 05/12/13 (from the past 72 hour(s))  CBC     Status: None   Collection Time    05/22/13  2:20 PM      Result Value Ref Range   WBC 4.4  4.0 - 10.5 K/uL   RBC 4.24  4.22 - 5.81 MIL/uL   Hemoglobin 13.7  13.0 - 17.0 g/dL   HCT 40.4  39.0 - 52.0 %   MCV 95.3  78.0 - 100.0 fL   MCH 32.3  26.0 - 34.0 pg   MCHC 33.9  30.0 - 36.0 g/dL   RDW 13.7  11.5 - 15.5 %   Platelets 263  150 - 400 K/uL  PROTIME-INR     Status: None   Collection Time    05/22/13  2:20 PM      Result Value Ref Range   Prothrombin Time 12.3  11.6 - 15.2 seconds   INR 0.93  0.00 - 7.41  BASIC METABOLIC PANEL     Status: Abnormal   Collection Time    05/22/13  2:20 PM      Result Value Ref Range   Sodium 139  137 - 147 mEq/L   Potassium 4.7  3.7 - 5.3 mEq/L   Chloride 102  96 - 112 mEq/L   CO2 26  19 - 32 mEq/L   Glucose, Bld 100 (*) 70 - 99 mg/dL   BUN 13  6 - 23 mg/dL   Creatinine, Ser 1.26  0.50 - 1.35 mg/dL   Calcium 9.3  8.4 - 10.5 mg/dL   GFR calc non Af Amer 65 (*) >90 mL/min   GFR calc Af Amer 76 (*) >90 mL/min   Comment: (NOTE)     The eGFR has been calculated using the CKD EPI equation.     This calculation has not been validated in all clinical situations.     eGFR's persistently <90 mL/min signify possible Chronic Kidney     Disease.     HEENT: normal Cardio: RRR and no murmur Resp: CTA B/L and unlabored GI: BS positive and NT.ND Extremity:  Pulses positive and No Edema Skin:   Intact Musc/Skel:  Normal Right hemiparesis UE>LE. Mild sensory deficits on right side. Pt with right central 7 and tongue deviation. Speech dysarthric. Sensation intact to light touch in the upper and lower limbs. motor  strength: RUE 3- delt,bi, 2- tri, grip. RLE 3- at HF,KE and KE/KF, 2-/5 at ankle   Assessment/Plan: 1. Functional deficits secondary to L MCA distribution infarct discharging to cardiology for heart cath/ further work up will have rehab admission team to monitor for rehab readmission after caridac w/u and treatment complete FIM: FIM - Bathing Bathing Steps Patient Completed: Chest;Abdomen;Right upper leg;Left upper leg;Right Arm;Right lower leg (including foot);Left lower leg (including foot);Buttocks;Front perineal area Bathing: 4: Min-Patient completes 8-9 [id number] parts or 75+ percent  FIM - Upper Body Dressing/Undressing Upper body dressing/undressing steps patient completed: Put head through opening of pull over shirt/dress;Pull shirt over trunk;Thread/unthread left sleeve of pullover shirt/dress Upper body dressing/undressing: 4: Min-Patient completed 75 plus % of tasks FIM - Lower Body Dressing/Undressing Lower body dressing/undressing steps patient completed:  Thread/unthread left underwear leg;Thread/unthread left pants leg;Pull underwear up/down;Don/Doff left sock;Don/Doff right sock Lower body dressing/undressing: 3: Mod-Patient completed 50-74% of tasks  FIM - Toileting Toileting steps completed by patient: Adjust clothing prior to toileting Toileting Assistive Devices: Grab bar or rail for support Toileting: 3: Mod-Patient completed 2 of 3 steps  FIM - TRadio producerDevices: Grab bars Toilet Transfers: 4-To toilet/BSC: Min A (steadying Pt. > 75%)  FIM - BControl and instrumentation engineerDevices: Orthosis;Arm rests (R AFO) Bed/Chair Transfer: 0: Activity did not occur  FIM - Locomotion: Wheelchair Distance: 60 Locomotion: Wheelchair: 2: Travels 50 - 149 ft with supervision, cueing or coaxing FIM - Locomotion: Ambulation Locomotion: Ambulation Assistive Devices: Orthosis;Other (comment) (hallway rail on L) Ambulation/Gait Assistance: 3: Mod assist Locomotion: Ambulation: 1: Travels less than 50 ft with moderate assistance (Pt: 50 - 74%)  Comprehension Comprehension Mode: Auditory Comprehension: 5-Follows basic conversation/direction: With no assist  Expression Expression Mode: Verbal Expression: 5-Expresses basic needs/ideas: With no assist  Social Interaction Social Interaction: 5-Interacts appropriately 90% of the time - Needs monitoring or encouragement for participation or interaction.  Problem Solving Problem Solving: 5-Solves basic problems: With no assist  Memory Memory: 6-More than reasonable amt of time  Medical Problem List and Plan:  1. 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 Status post revascularization with clot retrieval  2. DVT Prophylaxis/Anticoagulation: SCDs. Monitor for any signs of DVT  3. Pain Management: Tylenol as needed  4. Mood/anxiety: Xanax 0.25 mg twice a day, Ativan 1 mg every 6 as as needed anxiety.  Bed alarm for safety  5. Neuropsych: This patient is capable of making decisions on his own behalf.  6. Dysphagia. Dysphagia 2 thin liquids. Followup speech therapy  7.Hypertension. Cozaar 25 mg daily, Lasix 20 mg daily, Aldactone 12.5 mg daily. Monitor with increased mobility  8. Cardiomyopathy/CHF. Monitor for any signs of fluid overload. Followup cardiology services.  -PLAN IS NOW FOR HEART CATH ON Monday---PT WILL NEED TO RETURN TO ACUTE POST-PROCEDURE 9.  UTI- citrobacter sens Cefazolin--abx stopped 10.  Nausea: still occasional dry heaves but patient feels this is better.  - KUB negative. abx stopped  -likely anxiety component, cards notes an issue prior to admit  -split up protonix to bid schedule  -continue carafate and scheduled zofran as well     LOS (Days) 11 A FACE TO FACE EVALUATION WAS PERFORMED  ACharlett Blake4/20/2015, 7:23 AM

## 2013-05-23 NOTE — Progress Notes (Signed)
Social Work Patient ID: Isaac Johnson, male   DOB: 1965-09-19, 48 y.o.   MRN: [id number] Met with pt and wife who has POA papers and wants them notorized before his surgery today.  Have gotten Cindy-Financial Counselor to notorize and given original and copies To wife.  Aware he will go to acute before coming back to rehab after surgery today.

## 2013-05-23 NOTE — Op Note (Signed)
CARDIAC CATHETERIZATION REPORT   Procedures performed:  1. Left heart catheterization  2. Selective coronary angiography  3. Left ventriculography   Reason for procedure:  Congestive heart failure Cardiomyopathy of unknown etiology  Procedure performed by: Sanda Klein, MD, [facility]  Complications: none   Estimated blood loss: less than 5 mL   History:  48 year old gentleman presenting with ischemic stroke related to acute occlusion of the right middle cerebral artery treated with intra-arterial reperfusion techniques. He was found to have severe cardiomyopathy with a left ventricular ejection fraction of 20-25%. Diagnostic cardiac catheterization is requested to establish the etiology of his cardiomyopathy.  Consent: The risks, benefits, and details of the procedure were explained to the patient. Risks including death, MI, stroke, bleeding, limb ischemia, renal failure and allergy were described and accepted by the patient. Informed written consent was obtained prior to proceeding.  Technique: The patient was brought to the cardiac catheterization laboratory in the fasting state. He was prepped and draped in the usual sterile fashion. Local anesthesia with 1% lidocaine was administered to the right wrist and the right groin area. A 1.5-2 cm hard nodule was palpated anterior to the femoral artery, possibly a hematoma. Using the modified Seldinger technique a 5 French right radial artery sheath was introduced without difficulty. Similarly, a right common femoral vein sheath was introduced, staying medial of the small preexisting hematoma. Under fluoroscopic guidance, using 5 French TIG and angled pigtail catheters, selective cannulation of the left coronary artery, right coronary artery and left ventricle were respectively performed. Several coronary angiograms in a variety of projections were recorded, as well as a left ventriculogram in the RAO projection. Left ventricular pressure and a pull  back to the aorta were recorded. A balloon tipped Gordy Councilman catheter was used to record right heart pressures and obtain a PA blood sample for oximetry. No immediate complications occurred. At the end of the procedure, all catheters were removed. After the procedure, hemostasis will be achieved with a radial band/manual pressure, respectively.  Contrast used: 50 mL Omnipaque Medications: Verapamil 5mg , Nitroglycerin 400 mcg, lidocaine 1% 2 mL intraarterial; Heparin 3500 units IV; Versed 1, Fentanyl 25 mcg IV.  Angiographic Findings:  1. The left main coronary artery is free of significant atherosclerosis and bifurcates in the usual fashion into the left anterior descending artery and left circumflex coronary artery.  2. The left anterior descending artery is a large vessel that reaches the apex and generates one major diagonal branch. There is evidence of no luminal irregularities and no calcification. No hemodynamically meaningful stenoses are seen. 3. The left circumflex coronary artery is a large-size vessel non dominant vessel that generates three major oblique marginal arteries, the more distal being by far the largest. There is evidence of no luminal irregularities and no calcification. No hemodynamically meaningful stenoses are seen. 4. The right coronary artery is a medium-size dominant vessel that generates only a posterior descending artery. There is evidence of no luminal irregularities and no calcification. No hemodynamically meaningful stenoses are seen.  5. The left ventricle is normal in size. The left ventricle systolic function is severely decreased with an estimated ejection fraction of 25%. Regional wall motion abnormalities are not seen. No left ventricular thrombus is seen. There is moderate mitral insufficiency, but there was probably catheter entanglement in the subvalvular apparatus.. The ascending aorta appears normal. There is no aortic valve stenosis by pullback. The left  ventricular end-diastolic pressure is 18 mm Hg.    Hemodynamic findings:  Aortic pressure  137/79 (mean 102 ) mm Hg   Left ventricle 166/0 with end-diastolic pressure of 18 mm Hg  PA wedge pressure a wave 21, v wave 20 (mean 16) mm Hg  Pulmonary artery 40/16 (mean 26) mm Hg  Right ventricle 37/2 with an end-diastolic pressure of 5 mm Hg  Right atrium a wave 8, v wave 7 (mean 5) mm Hg  Cardiac output is 6 L per minute (cardiac index 3.1 L per minute per meter sq)  Oxygen saturation: Aortic 96%, pulmonary artery 73%  SVR 1289 dsc, SVRI 2630  PVR 133, PVRI 258  IMPRESSIONS:  Nonischemic cardiomyopathy, compensated. Normal coronary arteries.  RECOMMENDATION:  Medical therapy for CHF. Consider long term anticoagulation therapy with warfarin.     Sanda Klein, MD, [facility] [phone] office [phone] pager

## 2013-05-23 NOTE — H&P (View-Only) (Signed)
Reason for Consult:  bradycardia  Referring Physician:  Dr. Letta Pate [facility] PQA:ESLPNPY,YFRT A, MD Primary Cardiologist: Dr. Donalee Citrin is an 48 y.o. male.    Chief Complaint:  Episodic symptomatic bradycardia  HPI: This is second consult (first at [facility] 05/09/13) for 48 yo male with a hisotry of HTN, GERD, Brain lipoma, CHF, proteinuria, stomach ulcer, leukopenia. He presented with right sided weakness which started while driving. He was diagnosed with a stroke and underwent TPA at [facility]. He was transferred to [facility] and underwent left MCA revascularization with clot retrieval and stent placement in IR with Dr Estanislado Pandy. At some point prior to admission he was started on lasix 43m daily. 2D echo revealed an EF of 202% grade 2 diastolic dysfunction, mildly dialated LV and normal wall thickness. EKG reveals inferior and lateral TWI. He denies ever having CP, orthopnea, SOB but has had some LEE recently.  Carotid dopplers revealed 1-39 percent stenosis involving the right internal carotid artery and the left internal carotid artery. He had abnormal EKG and plans initially for cardiac cath during that admit, but prior to admit to rehab, plan was to allow rehab first.  Pt was placed on coreg 3.125 BID.  I reviewed strips from tele with reg. Hospital stay, + SR with PVCs, some trig.  HR up to 101, no bradycardia.   Pt admitted [facility] 05/12/13.  He has been found to be bradycardic beginning on the 11th with HR to mid to low 40s. Later HR as low as 30-50s. BP stable but pt felt weak and tired.  Coreg held. EKG with HR 96-100. Overall pt was doing better but with HR fluctuations.  Today HR from 53 to 111 and he has rec'd coreg.  Troponin and CKMB were neg for MI. Lytes and CBC stable.  D dimer was elevated but CTA was neg.  + UTI- treated with cefazolin.   EKG on the 11th with continued t wave inversion in inf lat leads.  No acute changes except on the 11th + PVCs.   Currently no  chest pain, + SOB with exertion and it occ wakes him at night though he does admit to not sleeping well.  He also has increased anxiety.  Echo:  05/06/13: Left ventricle: The cavity size was mildly dilated. Wall thickness was normal. The estimated ejection fraction was 25%. Diffuse hypokinesis. Features are consistent with a pseudonormal left ventricular filling pattern, with concomitant abnormal relaxation and increased filling pressure (grade 2 diastolic dysfunction). - Aortic valve: There was no stenosis. - Mitral valve: Trivial regurgitation. - Left atrium: The atrium was moderately dilated. - Right ventricle: The cavity size was normal. Systolic function was mildly reduced. - Right atrium: The atrium was mildly dilated. - Pulmonary arteries: No complete TR doppler jet so unable to estimate PA systolic pressure. - Systemic veins: IVC measured 2.3 cm with < 50% respirophasic variation, suggesting RA pressure 15 mmHg. - Pericardium, extracardiac: A trivial pericardial effusion was identified. Impressions:  - Mildly dilated LV with EF 25%, diffuse hypokinesis. Moderate diastolic dysfunction. Normal RV size with mildly decreased systolic function. No significant valvular abnormalities.    Past Medical History  Diagnosis Date  . Hypertension   . Stomach ulcer   . Gait instability   . Proteinuria   . GERD (gastroesophageal reflux disease)   . Insomnia   . Dizziness   . Leukopenia   . Sciatica     left  .  Brain lipoma 2010    Dr. Trenton Gammon  . CHF (congestive heart failure)     Past Surgical History  Procedure Laterality Date  . Radiology with anesthesia N/A 05/07/2013    Procedure: RADIOLOGY WITH ANESTHESIA;  Surgeon: Rob Hickman, MD;  Location: [place];  Service: Radiology;  Laterality: N/A;    Family History  Problem Relation Age of Onset  . Stroke Mother 96  . Gout Father    Social History:  reports that he has never smoked. He does not have any smokeless  tobacco history on file. He reports that he does not drink alcohol or use illicit drugs.  Allergies: No Known Allergies  Medications Prior to Admission  Medication Sig Dispense Refill  . carvedilol (COREG) 3.125 MG tablet Take 3.125 mg by mouth 2 (two) times daily with a meal.      . furosemide (LASIX) 20 MG tablet Take 20 mg by mouth daily.      [name] losartan (COZAAR) 50 MG tablet Take 50 mg by mouth daily.        Results for orders placed during the hospital encounter of 05/12/13 (from the past 48 hour(s))  COMPREHENSIVE METABOLIC PANEL     Status: Abnormal   Collection Time    05/17/13 10:58 AM      Result Value Ref Range   Sodium 140  137 - 147 mEq/L   Potassium 4.1  3.7 - 5.3 mEq/L   Chloride 105  96 - 112 mEq/L   CO2 20  19 - 32 mEq/L   Glucose, Bld 109 (*) 70 - 99 mg/dL   BUN 16  6 - 23 mg/dL   Creatinine, Ser 1.25  0.50 - 1.35 mg/dL   Calcium 8.8  8.4 - 10.5 mg/dL   Total Protein 7.1  6.0 - 8.3 g/dL   Albumin 3.2 (*) 3.5 - 5.2 g/dL   AST 24  0 - 37 U/L   ALT 24  0 - 53 U/L   Alkaline Phosphatase 47  39 - 117 U/L   Total Bilirubin 0.9  0.3 - 1.2 mg/dL   GFR calc non Af Amer 66 (*) >90 mL/min   GFR calc Af Amer 77 (*) >90 mL/min   Comment: (NOTE)     The eGFR has been calculated using the CKD EPI equation.     This calculation has not been validated in all clinical situations.     eGFR's persistently <90 mL/min signify possible Chronic Kidney     Disease.  CBC     Status: None   Collection Time    05/17/13 10:58 AM      Result Value Ref Range   WBC 5.4  4.0 - 10.5 K/uL   RBC 4.63  4.22 - 5.81 MIL/uL   Hemoglobin 14.8  13.0 - 17.0 g/dL   HCT 43.2  39.0 - 52.0 %   MCV 93.3  78.0 - 100.0 fL   MCH 32.0  26.0 - 34.0 pg   MCHC 34.3  30.0 - 36.0 g/dL   RDW 13.7  11.5 - 15.5 %   Platelets 271  150 - 400 K/uL   No results found.  ROS: General:no colds or fevers, no weight changes Skin:no rashes or ulcers HEENT:no blurred vision, no congestion CV:see HPI PUL:see  HPI GI:no diarrhea constipation or melena, no indigestion GU:no hematuria, no dysuria MS:no joint pain, no claudication Neuro:no syncope, + lightheadedness/ weakness with bradycardia, rt hemiparesis though is able to move more than he did  Endo:no diabetes, no thyroid disease   Blood pressure 128/88, pulse 111, temperature 98 F (36.7 C), temperature source Oral, resp. rate 20, height 6' (1.829 m), SpO2 99.00%. PE: General:Pleasant affect, NAD Skin:Warm and dry, brisk capillary refill HEENT:normocephalic, sclera clear, mucus membranes moist Neck:supple, no JVD, no bruits  Heart:S1S2 RRR without murmur,+ S4 gallup, rub or click Lungs:diminished without rales, rhonchi, or wheezes GNP:HQNE, non tender, + BS, do not palpate liver spleen or masses Ext:no lower ext edema, 2+ pedal pulses, 2+ radial pulses Neuro:alert and oriented, MAE, follows commands, + facial symmetry, weak squeeze with rt hand, + broad movements with rt arm, more control with rt leg.     Assessment/Plan  1. CVA  SP TPA and left MCA revascularization with clot retrieval and stent placement in IR with Dr Estanislado Pandy. ASA, plavix - now on rehab 2. Cardiomyopathy  EF 25% with diffuse hypokinesis, grade 2 diastolic dysfunction, mildly dilated LV and normal wall thickness. Vascular congestion with mild pulmonary edema on earlier CXR. Net fluids: +0.9L/-2.9L. He appears euvolemic currently. Abnormal EKG with possible inferior and lateral ischemia. Needs ischemic eval. Recommended left heart cath. Plan was to proceed post rehab.  Currently on ASA and plavix., low dose lasix at 20 mg. Spironolactone 12.5 mg daily. Does complain of DOE.    3. HTN --Bp fairly well controlled. Coreg 3.171m BID. Cozaar 25 mg daily,  4.  Bradycardia and tachycardia at other times.  No telemetry on rehab.   If more symptomatic bradycardia we would need to adjust meds. 5. anxiety  Will recheck EKG.  May need to proceed to cath now to allow more information  with DOE.  Continue coreg.  Check Pro BNP.   MD to see for further eval.    LCecilie Kicks Nurse Practitioner Certified CMitchell HeightsPager [phone] after 5pm or weekends call [phone] 05/17/2013, 1:20 PM    I have examined the patient and reviewed assessment and plan and discussed with patient.  Agree with above as stated.  The Coreg is beneficial for increasing LV function.  I would continue this.   His faitgue sx are most likely due to CHF.  Increase diuretics.  Lasix 40 mg daily starting tomorrow..  Will give extra 40 mg Now.  Orthopnea suggestive of fluid overload.  Check BNP.   For longterm, would consider Coumadin for anticoagulation when his dual antiplatelet therapy is .  It is likely that he had an embolic stroke due to low EF.  Will follow.  JJettie Booze

## 2013-05-23 NOTE — Progress Notes (Signed)
Occupational Therapy Weekly Progress Note  Patient Details  Name: RAMIR MALERBA MRN: [id number] Date of Birth: [birth year]  Beginning of progress report period: May 13, 2013 End of progress report period: May 23, 2013  Today's Date: 05/23/2013    Patient has met 2 of 3 short term goals.  Patient has demonstrated improved balance and coordination during functional transfers. Patient has limited awareness of positioning of RUE during self-care tasks requiring mod-max cues. Patient has been limited by flucuating HR and SOB during functional activities.   Patient continues to demonstrate the following deficits: decreased functional use of RUE/RLE, decreased activity tolerance,decreased coordination, decreased standing balance, decreased balance reactions, R hemiplegia, impaired visual perceptual skills, impaired timing and sequencing and therefore will continue to benefit from skilled OT intervention to enhance overall performance with BADLs, functional use of RUE/RLE, ability to compensate for deficits, activity tolerance, and standing balance.  Patient progressing toward long term goals..  Continue plan of care.  OT Short Term Goals Week 1:  OT Short Term Goal 1 (Week 1): Groom: Patient will perform 3/5 grooming tasks standing at sink with min assist OT Short Term Goal 1 - Progress (Week 1): Met OT Short Term Goal 2 (Week 1): Bath:  Min assist to include sit and stand. OT Short Term Goal 2 - Progress (Week 1): Met OT Short Term Goal 3 (Week 1): UB Dressing: Min assist in unsupported sitting OT Short Term Goal 3 - Progress (Week 1): Met OT Short Term Goal 4 (Week 1): LB Dressing:  Mod Assist to include sit and stand OT Short Term Goal 4 - Progress (Week 1): Met OT Short Term Goal 5 (Week 1): RUE:  Patient will position and monitor RUE at all times during BADL and functional mobility with no more than 4 vcs per session OT Short Term Goal 5 - Progress (Week 1): Not met Week 2:  OT Short Term  Goal 1 (Week 2): RUE:  Patient will position and monitor RUE at all times during BADL and functional mobility with no more than 4 vcs per session OT Short Term Goal 2 (Week 2): Patient will complete 3/3 toilet tasks with steadying assist only OT Short Term Goal 3 (Week 2): Patient will complete LB dressing with min assist  OT Short Term Goal 4 (Week 2): Patient will initiate use of RUE as stabilizer during self-care task with no cues      Therapy Documentation Precautions:  Precautions Precautions: Fall Precaution Comments: Pusher tendencies, Right hemi, Right inattention Restrictions Weight Bearing Restrictions: No General:   Vital Signs: Therapy Vitals Temp: 98.5 F (36.9 C) Temp src: Oral Pulse Rate: 88 Resp: 19 BP: 113/78 mmHg Patient Position, if appropriate: Sitting Oxygen Therapy SpO2: 100 % O2 Device: None (Room air) Pain:   ADL:   Exercises:   Other Treatments:    See FIM for current functional status  Therapy/Group: Individual Therapy  Quillian Quince Macedonio Scallon 05/23/2013, 7:38 AM

## 2013-05-23 NOTE — Discharge Summary (Signed)
  Discharge summary job [id number]

## 2013-05-23 NOTE — Discharge Summary (Signed)
Isaac Johnson, Isaac Johnson                  ACCOUNT NO.:  [id number]  MEDICAL RECORD NO.:  [id number]  LOCATION:  4W07C                        FACILITY:  [facility]  PHYSICIAN:  Charlett Blake, M.D.DATE OF BIRTH:  [birth year]  DATE OF ADMISSION:  05/12/2013 DATE OF DISCHARGE:  05/23/2013                              DISCHARGE SUMMARY   DISCHARGE DIAGNOSES: 1. 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  Status post     revascularization with clot retrieval. 2. Sequential compression devices for deep vein thrombosis     prophylaxis. 3. Anxiety. 4. Dysphagia. 5. Hypertension. 6. Cardiomyopathy with congestive heart failure. 7. Urinary tract infection, resolved.  HISTORY OF PRESENT ILLNESS:  This is a 48 year old right-handed male with history of congestive heart failure who was admitted [facility] May 06, 2013, with right-sided weakness.  He was treated with tPA, transferred to [facility] for further treatment.  CT of the head negative for acute changes.  Echocardiogram with ejection fraction of 25% with diffuse hypokinesis with grade 2 diastolic dysfunction.  Carotid Dopplers without significant ICA stenosis.  The patient with improvement after tPA.  On May 07, 2013, he developed recurrent dense right-sided weakness.  Followup MRI of the brain revealed scattered acute ischemic infarcts involving the left basal ganglia and other left MCA territory with complete occlusion of the proximal L-M1 segment.  He underwent cerebral angiogram with revascularization and clot retrieval of occluded left MCA with 3 passes by solitary as well as stent placement by Dr. Estanislado Pandy of Interventional Radiology.  He was evaluated by Cardiology Services who recommended heart catheterization prior to hospital discharge to rule out CAD as cause of cardiomyopathy and expressed his concerns of CVA could be cardioembolic due to low  ejection fraction.  Medications adjusted.  He was on aspirin and Plavix therapy.  CTA of the chest negative for pulmonary emboli with evidence of nodal prominence of questionable etiology.  Maintained on a dysphagia 2 thin liquid diet. The patient was admitted for comprehensive rehab program.  PAST MEDICAL HISTORY:  See discharge diagnoses.  SOCIAL HISTORY:  Lives with spouse.  FUNCTIONAL HISTORY:  Prior to admission, independent.  Functional status upon admission to rehab services was mod to max assist for mobility.  PHYSICAL EXAMINATION:  VITAL SIGNS:  Blood pressure 122/85, pulse 79, temperature 98.7, respirations 18. GENERAL:  This was an alert male, oriented x3.  Flat Affect. HEENT:  Poor dentition.  Pupils round and reactive to light. LUNGS:  Clear to auscultation. CARDIAC:  Rate controlled. ABDOMEN:  Soft, nontender.  Good bowel sounds.  REHABILITATION HOSPITAL COURSE:  The patient was admitted to inpatient rehab services with therapies initiated on a 3-hour daily basis consisting of physical therapy, occupational therapy, speech therapy, and rehabilitation nursing.  The following issues were addressed during the patient's rehabilitation stay.  Pertaining to Mr. Tremont multiple infarcts left MCA distribution remained stable, maintained on aspirin and Plavix therapy.  Sequential compression devices in place for DVT prophylaxis.  He was using Xanax on a limited basis for anxiety with emotional support provided.  He was currently on a dysphagia 2 thin liquid  diet, followed by Speech Therapy.  Blood pressures monitored closely with noted cardiomyopathy, congestive heart failure.  He remained on Lasix therapy as advised as well as Cozaar with Coreg.  Plan was for cardiac catheterization on May 23, 2013, to establish etiology of cardiomyopathy embolic CVA.  The patient received weekly collaborative interdisciplinary team conferences to discuss estimated length of stay, family  teaching, and any barriers to discharge.  He was propelling his wheelchair with supervision.  Right AFO brace in place. Supervision minimal assist for bed mobility, moderate assist transfers. Participation limited by fatigue.  Moderate assist functional transfers and lower body dressing, minimal assist, upper body dressing for activities of daily living.  Plan was established for discharge to acute care services on May 23, 2013, for cardiac catheterization.  The patient will be discharged to rehab services at that time.  DISCHARGE MEDICATIONS:  At the time of dictation included: 1. Xanax 0.5 mg p.o. t.i.d. as needed. 2. Aspirin 325 mg p.o. daily. 3. Coreg 3.125 mg p.o. b.i.d. 4. Plavix 75 mg p.o. daily. 5. Lasix 40 mg p.o. daily. 6. Cozaar 25 mg p.o. daily. 7. Protonix 80 mg p.o. at bedtime. 8. Potassium chloride 40 mEq p.o. daily. 9. Aldactazide own 12.5 mg p.o. daily. 10.Carafate 1 g p.o. b.i.d. 11.Ultram 50 mg p.o. every 6 hours as needed for moderate pain.  DIET:  Dysphagia 2 thin liquid diet.     Isaac Johnson, P.A.   ______________________________ Charlett Blake, M.D.    DA/MEDQ  D:  05/23/2013  T:  05/23/2013  Job:  [id number]  cc:   Loralie Champagne, MD

## 2013-05-24 ENCOUNTER — Encounter (HOSPITAL_COMMUNITY): Payer: Self-pay | Admitting: General Practice

## 2013-05-24 ENCOUNTER — Inpatient Hospital Stay (HOSPITAL_REHABILITATION)
Admission: RE | Admit: 2013-05-24 | Discharge: 2013-06-08 | Disposition: A | Discharge status: Home / Self Care | Payer: BC Managed Care – PPO | Source: Intra-hospital | Attending: Physical Medicine & Rehabilitation | Admitting: Physical Medicine & Rehabilitation

## 2013-05-24 DIAGNOSIS — I5021 Acute systolic (congestive) heart failure: Secondary | ICD-10-CM

## 2013-05-24 DIAGNOSIS — I6350 Cerebral infarction due to unspecified occlusion or stenosis of unspecified cerebral artery: Secondary | ICD-10-CM

## 2013-05-24 DIAGNOSIS — I639 Cerebral infarction, unspecified: Secondary | ICD-10-CM | POA: Diagnosis present

## 2013-05-24 DIAGNOSIS — I63519 Cerebral infarction due to unspecified occlusion or stenosis of unspecified middle cerebral artery: Secondary | ICD-10-CM

## 2013-05-24 DIAGNOSIS — I509 Heart failure, unspecified: Secondary | ICD-10-CM

## 2013-05-24 DIAGNOSIS — I428 Other cardiomyopathies: Secondary | ICD-10-CM

## 2013-05-24 DIAGNOSIS — I429 Cardiomyopathy, unspecified: Secondary | ICD-10-CM

## 2013-05-24 LAB — BASIC METABOLIC PANEL
BUN: 16 mg/dL (ref 6–23)
CO2: 25 mEq/L (ref 19–32)
Calcium: 9.4 mg/dL (ref 8.4–10.5)
Chloride: 104 mEq/L (ref 96–112)
Creatinine, Ser: 1.34 mg/dL (ref 0.50–1.35)
GFR calc Af Amer: 70 mL/min — ABNORMAL LOW (ref >90)
GFR calc non Af Amer: 61 mL/min — ABNORMAL LOW (ref >90)
Glucose, Bld: 93 mg/dL (ref 70–99)
Potassium: 3.7 mEq/L (ref 3.7–5.3)
Sodium: 141 mEq/L (ref 137–147)

## 2013-05-24 LAB — CBC
HCT: 42.5 % (ref 39.0–52.0)
HEMOGLOBIN: 14.4 g/dL (ref 13.0–17.0)
MCH: 31.9 pg (ref 26.0–34.0)
MCHC: 33.9 g/dL (ref 30.0–36.0)
MCV: 94.2 fL (ref 78.0–100.0)
Platelets: 260 10*3/uL (ref 150–400)
RBC: 4.51 MIL/uL (ref 4.22–5.81)
RDW: 13.5 % (ref 11.5–15.5)
WBC: 4.8 10*3/uL (ref 4.0–10.5)

## 2013-05-24 LAB — CREATININE, SERUM
Creatinine, Ser: 1.28 mg/dL (ref 0.50–1.35)
GFR calc Af Amer: 74 mL/min — ABNORMAL LOW (ref >90)
GFR, EST NON AFRICAN AMERICAN: 64 mL/min — AB (ref >90)

## 2013-05-24 LAB — MAGNESIUM: Magnesium: 2.1 mg/dL (ref 1.5–2.5)

## 2013-05-24 MED ORDER — GUAIFENESIN-DM 100-10 MG/5ML PO SYRP
15.0000 mL | ORAL_SOLUTION | ORAL | Status: DC | PRN
  Administered 2013-05-24: 02:00:00 15 mL via ORAL
  Filled 2013-05-24 (×2): qty 15

## 2013-05-24 MED ORDER — CLOPIDOGREL BISULFATE 75 MG PO TABS: 75.0000 mg | ORAL_TABLET | Freq: Every day | ORAL | Status: DC

## 2013-05-24 MED ORDER — CARVEDILOL 3.125 MG PO TABS
3.1250 mg | ORAL_TABLET | Freq: Two times a day (BID) | ORAL | Status: DC
  Administered 2013-05-24: 3.125 mg via ORAL

## 2013-05-24 MED ORDER — SPIRONOLACTONE 12.5 MG HALF TABLET: 12.5000 mg | ORAL_TABLET | Freq: Every day | ORAL | Status: DC

## 2013-05-24 MED ORDER — HYDROCHLOROTHIAZIDE 12.5 MG PO CAPS
12.5000 mg | ORAL_CAPSULE | Freq: Every day | ORAL | Status: DC
  Administered 2013-05-25 – 2013-06-08 (×15): 12.5 mg via ORAL
  Filled 2013-05-24 (×16): qty 1

## 2013-05-24 MED ORDER — SUCRALFATE 1 GM/10ML PO SUSP
1.0000 g | Freq: Two times a day (BID) | ORAL | Status: DC
  Administered 2013-05-24 – 2013-06-07 (×28): 1 g via ORAL
  Filled 2013-05-24 (×31): qty 10

## 2013-05-24 MED ORDER — TRAMADOL HCL 50 MG PO TABS: 50.0000 mg | ORAL_TABLET | Freq: Four times a day (QID) | ORAL | Status: DC | PRN

## 2013-05-24 MED ORDER — ACETAMINOPHEN 325 MG PO TABS
650.0000 mg | ORAL_TABLET | ORAL | Status: DC | PRN
  Administered 2013-05-25 – 2013-05-29 (×3): 650 mg via ORAL
  Filled 2013-05-24 (×3): qty 2

## 2013-05-24 MED ORDER — SORBITOL 70 % SOLN: 30.0000 mL | Freq: Every day | Status: DC | PRN

## 2013-05-24 MED ORDER — SPIRONOLACTONE 12.5 MG HALF TABLET
12.5000 mg | ORAL_TABLET | Freq: Every day | ORAL | Status: DC
  Administered 2013-05-25 – 2013-06-08 (×15): 12.5 mg via ORAL
  Filled 2013-05-24 (×16): qty 1

## 2013-05-24 MED ORDER — FUROSEMIDE 40 MG PO TABS: 40.0000 mg | ORAL_TABLET | Freq: Every day | ORAL | Status: DC

## 2013-05-24 MED ORDER — CLOPIDOGREL BISULFATE 75 MG PO TABS
75.0000 mg | ORAL_TABLET | Freq: Every day | ORAL | Status: DC
  Administered 2013-05-25 – 2013-06-08 (×15): 75 mg via ORAL
  Filled 2013-05-24 (×16): qty 1

## 2013-05-24 MED ORDER — ONDANSETRON HCL 4 MG/2ML IJ SOLN: 4.0000 mg | Freq: Four times a day (QID) | INTRAMUSCULAR | Status: DC | PRN

## 2013-05-24 MED ORDER — POTASSIUM CHLORIDE CRYS ER 20 MEQ PO TBCR
40.0000 meq | EXTENDED_RELEASE_TABLET | Freq: Every day | ORAL | Status: DC
  Administered 2013-05-25 – 2013-06-08 (×15): 40 meq via ORAL
  Filled 2013-05-24 (×17): qty 2

## 2013-05-24 MED ORDER — SPIRONOLACTONE 12.5 MG HALF TABLET
12.5000 mg | ORAL_TABLET | Freq: Every day | ORAL | Status: DC
  Administered 2013-05-24: 10:00:00 12.5 mg via ORAL
  Filled 2013-05-24: qty 1

## 2013-05-24 MED ORDER — LOSARTAN POTASSIUM 25 MG PO TABS
25.0000 mg | ORAL_TABLET | Freq: Two times a day (BID) | ORAL | Status: DC
  Administered 2013-05-24: 10:00:00 25 mg via ORAL
  Filled 2013-05-24 (×2): qty 1

## 2013-05-24 MED ORDER — SUCRALFATE 1 GM/10ML PO SUSP: 1.0000 g | Freq: Two times a day (BID) | ORAL | Status: DC

## 2013-05-24 MED ORDER — LOSARTAN POTASSIUM 25 MG PO TABS
25.0000 mg | ORAL_TABLET | Freq: Two times a day (BID) | ORAL | Status: DC
  Administered 2013-05-24 – 2013-06-08 (×30): 25 mg via ORAL
  Filled 2013-05-24 (×32): qty 1

## 2013-05-24 MED ORDER — ZOLPIDEM TARTRATE 5 MG PO TABS: 5.0000 mg | ORAL_TABLET | Freq: Every evening | ORAL | Status: DC | PRN

## 2013-05-24 MED ORDER — GUAIFENESIN-DM 100-10 MG/5ML PO SYRP
15.0000 mL | ORAL_SOLUTION | ORAL | Status: DC | PRN
  Administered 2013-06-01 – 2013-06-03 (×3): 15 mL via ORAL
  Filled 2013-05-24 (×3): qty 15

## 2013-05-24 MED ORDER — FUROSEMIDE 40 MG PO TABS
40.0000 mg | ORAL_TABLET | Freq: Every day | ORAL | Status: DC
  Administered 2013-05-25 – 2013-06-08 (×15): 40 mg via ORAL
  Filled 2013-05-24 (×17): qty 1

## 2013-05-24 MED ORDER — ASPIRIN EC 81 MG PO TBEC
81.0000 mg | DELAYED_RELEASE_TABLET | Freq: Every day | ORAL | Status: DC
  Administered 2013-05-25 – 2013-06-08 (×15): 81 mg via ORAL
  Filled 2013-05-24 (×16): qty 1

## 2013-05-24 MED ORDER — NITROGLYCERIN 0.4 MG SL SUBL: 0.4000 mg | SUBLINGUAL_TABLET | SUBLINGUAL | Status: DC | PRN

## 2013-05-24 MED ORDER — ASPIRIN 81 MG PO TBEC: 81.0000 mg | DELAYED_RELEASE_TABLET | Freq: Every day | ORAL | Status: DC

## 2013-05-24 MED ORDER — HEPARIN SODIUM (PORCINE) 5000 UNIT/ML IJ SOLN: 5000.0000 [IU] | Freq: Three times a day (TID) | INTRAMUSCULAR | Status: DC

## 2013-05-24 MED ORDER — PANTOPRAZOLE SODIUM 40 MG PO TBEC
80.0000 mg | DELAYED_RELEASE_TABLET | Freq: Every day | ORAL | Status: DC
  Administered 2013-05-24 – 2013-06-07 (×15): 80 mg via ORAL
  Filled 2013-05-24 (×16): qty 2

## 2013-05-24 MED ORDER — CARVEDILOL 3.125 MG PO TABS
3.1250 mg | ORAL_TABLET | Freq: Two times a day (BID) | ORAL | Status: DC
  Administered 2013-05-24 – 2013-06-08 (×30): 3.125 mg via ORAL
  Filled 2013-05-24 (×32): qty 1

## 2013-05-24 MED ORDER — ONDANSETRON HCL 4 MG PO TABS
4.0000 mg | ORAL_TABLET | Freq: Four times a day (QID) | ORAL | Status: DC | PRN
  Administered 2013-06-01: 4 mg via ORAL
  Filled 2013-05-24: qty 1

## 2013-05-24 MED ORDER — ZOLPIDEM TARTRATE 5 MG PO TABS
5.0000 mg | ORAL_TABLET | Freq: Every evening | ORAL | Status: DC | PRN
  Administered 2013-05-24 – 2013-06-07 (×13): 5 mg via ORAL
  Filled 2013-05-24 (×16): qty 1

## 2013-05-24 MED ORDER — ALPRAZOLAM 0.5 MG PO TABS
0.5000 mg | ORAL_TABLET | Freq: Three times a day (TID) | ORAL | Status: DC | PRN
  Administered 2013-05-24 – 2013-06-07 (×19): 0.5 mg via ORAL
  Filled 2013-05-24 (×20): qty 1

## 2013-05-24 MED ORDER — HEPARIN SODIUM (PORCINE) 5000 UNIT/ML IJ SOLN
5000.0000 [IU] | Freq: Three times a day (TID) | INTRAMUSCULAR | Status: DC
  Administered 2013-05-24 – 2013-06-01 (×22): 5000 [IU] via SUBCUTANEOUS
  Filled 2013-05-24 (×26): qty 1

## 2013-05-24 MED ORDER — HYDROCHLOROTHIAZIDE 12.5 MG PO CAPS
12.5000 mg | ORAL_CAPSULE | Freq: Every day | ORAL | Status: DC
  Administered 2013-05-24: 12.5 mg via ORAL
  Filled 2013-05-24: qty 1

## 2013-05-24 MED ORDER — POTASSIUM CHLORIDE CRYS ER 20 MEQ PO TBCR: 10.0000 meq | EXTENDED_RELEASE_TABLET | Freq: Every day | ORAL | Status: DC

## 2013-05-24 MED ORDER — ZOLPIDEM TARTRATE 5 MG PO TABS
5.0000 mg | ORAL_TABLET | Freq: Every evening | ORAL | Status: DC | PRN
  Administered 2013-05-24: 5 mg via ORAL
  Filled 2013-05-24: qty 1

## 2013-05-24 NOTE — Progress Notes (Addendum)
I was contacted to assist in readmission of pt to inpt rehab after his cardiac cath yesterday. Pt is in agreement. I have [employer] insurance approval to readmit pt to inpt rehab today. I have contacted [place] and they approve him to return to inpt rehab today.  I will arrange. [phone]

## 2013-05-24 NOTE — Consult Note (Signed)
Heart Failure Navigator Consult Note  Presentation: Isaac Johnson is a [name] with history of CHF, stomach ulcers, sciatica LLE, brain lipoma; who was admitted to [facility] on 05/06/13 with right side weakness. He was treated with tPA and transferred to [facility] for further treatment. CT head negative for acute changes. 2D echo with EF 25% with diffuse hypokinesis and grade 2 diastolic dysfunction. Carotid dopplers without significant ICA stenosis. Patient with improvement after tPA . 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 Estanislado Pandy. He was evaluated by Dr. Aundra Dubin who who recommends heart cath prior to discharge to rule out CAD as cause of CM and expressed concerns that CVA could be cardioembolic due to low EF. Medications adjusted and . He was started on ASA and plavix for secondary stroke prevention given stent in place. He has had high levels of anxiety with SOB (hyperventilating?) requiring ativan. CTA of chest negative for PE but with evidence of nodal prominence of questionable etiology. Maintained on dysphagia 2 and liquid diet. Therapy evaluations completed and CIR recommended by rehab team. Patient was admitted for comprehensive rehabilitation program    Past Medical History  Diagnosis Date  . Hypertension   . Stomach ulcer   . Gait instability   . Proteinuria   . GERD (gastroesophageal reflux disease)   . Insomnia   . Dizziness   . Leukopenia   . Sciatica     left  . Brain lipoma 2010    Dr. Trenton Gammon  . CHF (congestive heart failure)   . Acute systolic heart failure   .  Cardiomyopathy   . CVA (cerebral infarction)   . Shortness of breath   . Anxiety   . Stroke     History   Social History  . Marital Status: Married    Spouse Name: N/A    Number of Children: N/A  . Years of Education: N/A   Social History Main Topics  . Smoking status: Never Smoker   . Smokeless tobacco: Never Used  . Alcohol Use: No  . Drug Use: No  . Sexual Activity: None   Other Topics Concern  . None   Social History Narrative  . None    ECHO:Study Conclusions-05/06/13  - Left ventricle: The cavity size was mildly dilated. Wall thickness was normal. The estimated ejection fraction was 25%. Diffuse hypokinesis. Features are consistent with a pseudonormal left ventricular filling pattern, with concomitant abnormal relaxation and increased filling pressure (grade 2 diastolic dysfunction). - Aortic valve: There was no stenosis. - Mitral valve: Trivial regurgitation. - Left atrium: The atrium was moderately dilated. - Right ventricle: The cavity size was normal. Systolic function was mildly reduced. - Right atrium: The atrium was mildly dilated. - Pulmonary arteries: No complete TR doppler jet so unable to estimate PA systolic pressure. - Systemic veins: IVC measured 2.3 cm with < 50% respirophasic variation, suggesting RA pressure 15 mmHg. - Pericardium, extracardiac: A trivial pericardial effusion was identified. Impressions:  - Mildly dilated LV with EF 25%, diffuse hypokinesis. Moderate diastolic dysfunction. Normal RV size with mildly decreased systolic function. No significant valvular abnormalities. Transthoracic echocardiography  Cardiac catheterization-05/23/13--  IMPRESSIONS:  Nonischemic cardiomyopathy, compensated.  Normal coronary arteries.    BNP    Component Value Date/Time   PROBNP 2735.0* 05/20/2013 0714    Education Assessment and Provision:  Detailed education and instructions provided on heart failure disease management including  the following:  Signs and symptoms of Heart Failure When to call the physician Importance of daily weights Low sodium diet  Fluid restriction Medication management Anticipated future follow-up appointments  Patient education given on each of the above topics.  Patient acknowledges understanding and acceptance of all instructions. Patient verbalizes understanding.  He plans to discharge to home with his wife and daughter.  He says that they can purchase a scale upon discharge--unsure if he will be able to stand to weigh physically secondary to his new CVA.  Education Materials:  "Living Better With Heart Failure" Booklet, Daily Weight Tracker Tool and Heart Failure Educational Video.   High Risk Criteria for Readmission and/or Poor Patient Outcomes:   EF <30%- Yes -25%  2 or more admissions in 6 months- Yes  Difficult social situation- No  Demonstrates medication noncompliance- No   Barriers of Care:  Knowledge of medical condition and physical barriers associated with new CVA  Discharge Planning:   Plans to discharge to CIR then to home with wife and daughter.

## 2013-05-24 NOTE — Consult Note (Signed)
NEUROCOGNITIVE STATUS EXAMINATION - [place]   [name] is a 48 year old, right-handed man, who was seen for a neurocognitive status examination to evaluate his emotional state and mental status post-stroke.  According to his medical record, [name] was admitted on 05/06/13 with right sided weakness.  He was treated with TPA.  CT of his head was negative for acute changes.  However, he improved with TPA and on 05/07/13 he developed recurrent dense right-sided weakness with follow-up MRI of the brain demonstrating scattered acute ischemic infarcts involving the left basal ganglia and other left MCA territories.  He underwent cerebral angio with revascularization and clot retrieval to repair an occluded area.  It was suspected that CAD could have been the cause of his CM and concerns were expressed that CVA could be cardioembolic due to low EF.  He was placed on medication for secondary stroke prevention.  [name] was also noted to have significant anxiety with shortness of breath.  Owing to his stroke and report of presence of anxiety, a neuropsychological consult was requested to help ascertain the nature and extent of cognitive and emotional changes in order to provide treatment recommendations.    Emotional Functioning:  During the clinical interview, [name] described his mood as "pretty good," and was generally optimistic about his recovery.  However, he acknowledged some low mood related to loss of independence, particularly related to toileting.  His greatest concern at the time of the current interview was pressure in his stomach and chest and he mentioned that this sensation was being medically evaluated, though no conclusions as to the etiology of his chest pressure had been drawn yet.  Although [name] denied most symptoms of depression, several symptoms of anxiety were reported.  Specifically, he described himself as a Patent attorney" and cited worry over  bills, debt, and whether he will be able to return to work as his primary sources of worry at this time.  He also repeatedly stated that he does not want to be a "burden" either at home or at work.  [name] also mentioned that his sleep is poor and that medication to help aid in sleep has not been helpful as of yet.  He commented that his daytime fatigue from poor sleep has been adversely impacting his ability to participate in certain therapies.  [name] said that he typically uses spirituality and deep breathing techniques to help him cope.  He also described having a good support network that helps him get through tough times.  Although he stated that it was a bit uncomfortable to open up during today's session, he was open to participating in individual psychotherapy as an outpatient.    [name] responses to a self-report measure of symptoms of depression were not suggestive of the presence of clinically significant depressed mood at this time.    Mental Status:  [name] total score on an overall measure of mental status was not suggestive of the presence of marked cognitive impairment at this time (MoCA = 47/09; note: certain items were unable to be administered due to inability to use his dominant hand to write or draw).  [name] lost points for failure to recall 3 of 5 previously studied words and for making an error on a measure of verbal abstract reasoning.  All of his performances on other subtests were intact.  Subjectively, he noted that his memory felt "off" and he denied experiencing other cognitive changes.  Impressions and Recommendations:  [name] overall neurocognitive profile was not suggestive of the presence of significant cognitive impairment.  Although mild memory inefficiencies were noted, those reduced performances could easily be explained by fatigue.  However, [name] was advised that if his memory difficulties persist or become intrusive in his ability to perform  daily activities, that he should seek an extensive neuropsychological evaluation as an outpatient.  Therefore, resources for neuropsychological services in his area should be provided with his discharge paperwork.  From an emotional standpoint, [name] seems to be experiencing significant anxiety.  Although he mentioned feeling pressure in his chest, which can sometimes be a symptom of panic, he denied other physical symptoms of anxiety as well as any particular connection between his chest pressure and cognitive worry.  Therefore, his chest pressure is likely unrelated to anxiety and should continue to be medically evaluated.  [name] stated that he had unfavorable reactions to treatments with anti-anxiety medications in the past.  Therefore, individual psychotherapy may be the best approach to managing his anxious mood.  He was agreeable to this plan and information on local providers should be given to him with his discharge paperwork.    DIAGNOSES: CVA Unspecified Anxiety Disorder  Marlane Hatcher, Psy.D.  Clinical Neuropsychologist

## 2013-05-24 NOTE — Progress Notes (Signed)
Pt arrived to unit at 1540.  VS 122/92 manual with HR ranging 27-102. Started at 47 with a jump to 40 then 95. HR dropped ranging 45-94 within seconds of changing. Within the minute of HR check, HR was at 32-38 for about 10 seconds with a fast jump to 102. HR continues to fluctuate with a wide range. Will notify PA about findings. Pt states he is tired and didn't get any sleep last few nights. Very restless. Continue to monitor pt.

## 2013-05-24 NOTE — Progress Notes (Signed)
Patient had frequent PVC bigeminy and trigeminy . Notified Dr. Colon Flattery he added  Mg+ level to labs which are pending.  He had a dry cough all night with  little relief  from PRN Robitussin. H e also was restless with little relief from PRN meds. I will continue   To monitor.

## 2013-05-24 NOTE — Discharge Summary (Signed)
Physician Discharge Summary     Patient ID: Isaac Johnson MRN: [id number] DOB/AGE: 50-23-1967 50 y.o.  Admit date: 05/23/2013 Discharge date: 05/24/2013  Admission Diagnoses:  Cardiomyopathy  Discharge Diagnoses:  Active Problems:   CHF with cardiomyopathy   NICM (nonischemic cardiomyopathy)   CVA-recent  Discharged Condition: stable  Hospital Course:   48 year old gentleman presenting from IP rehab after suffering an ischemic stroke related to acute occlusion of the right middle cerebral artery treated with intra-arterial reperfusion techniques. He was found to have severe cardiomyopathy with a left ventricular ejection fraction of 20-25%. Diagnostic cardiac catheterization is requested to establish the etiology of his cardiomyopathy.  The cardiac caths were completed and revealed normal coronary arteries and mildly elevated right heart pressures.   He was discharged from by neurology on ASA and plavix.  Consideration for warfarin should be given due to low EF and possibility that the CVA source was from an LV thrombus.  Losartan was increased to BID.  HCTZ was discontinued as the patient is on lasix and aldactone.  The patient was seen by Dr. Claiborne Billings who felt he was stable for DC to inpatient rehab.    Consults: None  Significant Diagnostic Studies:   Left/Right heart caths Contrast used: 50 mL Omnipaque  Medications: Verapamil 5mg , Nitroglycerin 400 mcg, lidocaine 1% 2 mL intraarterial; Heparin 3500 units IV; Versed 1, Fentanyl 25 mcg IV.  Angiographic Findings:  1. The left main coronary artery is free of significant atherosclerosis and bifurcates in the usual fashion into the left anterior descending artery and left circumflex coronary artery.  2. The left anterior descending artery is a large vessel that reaches the apex and generates one major diagonal branch. There is evidence of no luminal irregularities and no calcification. No hemodynamically meaningful stenoses are seen.  3.  The left circumflex coronary artery is a large-size vessel non dominant vessel that generates three major oblique marginal arteries, the more distal being by far the largest. There is evidence of no luminal irregularities and no calcification. No hemodynamically meaningful stenoses are seen.  4. The right coronary artery is a medium-size dominant vessel that generates only a posterior descending artery. There is evidence of no luminal irregularities and no calcification. No hemodynamically meaningful stenoses are seen.  5. The left ventricle is normal in size. The left ventricle systolic function is severely decreased with an estimated ejection fraction of 25%. Regional wall motion abnormalities are not seen. No left ventricular thrombus is seen. There is moderate mitral insufficiency, but there was probably catheter entanglement in the subvalvular apparatus.. The ascending aorta appears normal. There is no aortic valve stenosis by pullback. The left ventricular end-diastolic pressure is 18 mm Hg.   Hemodynamic findings:  Aortic pressure 137/79 (mean 102 ) mm Hg  Left ventricle 703/5 with end-diastolic pressure of 18 mm Hg  PA wedge pressure a wave 21, v wave 20 (mean 16) mm Hg  Pulmonary artery 40/16 (mean 26) mm Hg  Right ventricle 37/2 with an end-diastolic pressure of 5 mm Hg  Right atrium a wave 8, v wave 7 (mean 5) mm Hg  Cardiac output is 6 L per minute (cardiac index 3.1 L per minute per meter sq)  Oxygen saturation: Aortic 96%, pulmonary artery 73%  SVR 1289 dsc, SVRI 2630  PVR 133, PVRI 258  IMPRESSIONS:  Nonischemic cardiomyopathy, compensated.  Normal coronary arteries.  RECOMMENDATION:  Medical therapy for CHF. Consider long term anticoagulation therapy with warfarin.     Sanda Klein, MD,  [place]  [facility]  Treatments: See above  Discharge Exam: Blood pressure 132/92, pulse 85, temperature 97.7 F (36.5 C), temperature source Oral, resp. rate 18, weight 161 lb 2.5 oz  (73.1 kg), SpO2 99.00%.   Disposition: 70-Another Health Care Institution Not Defined      Discharge Orders   Future Orders Complete By Expires   Diet - low sodium heart healthy  As directed    Increase activity slowly  As directed        Medication List         aspirin 81 MG EC tablet  Take 1 tablet (81 mg total) by mouth daily.     carvedilol 3.125 MG tablet  Commonly known as:  COREG  Take 3.125 mg by mouth 2 (two) times daily with a meal.     clopidogrel 75 MG tablet  Commonly known as:  PLAVIX  Take 1 tablet (75 mg total) by mouth daily with breakfast.     furosemide 40 MG tablet  Commonly known as:  LASIX  Take 1 tablet (40 mg total) by mouth daily.     losartan 50 MG tablet  Commonly known as:  COZAAR  Take 50 mg by mouth daily.     omeprazole 20 MG capsule  Commonly known as:  PRILOSEC  Take 20 mg by mouth daily.     potassium chloride SA 20 MEQ tablet  Commonly known as:  K-DUR,KLOR-CON  Take 0.5 tablets (10 mEq total) by mouth daily.     spironolactone 12.5 mg Tabs tablet  Commonly known as:  ALDACTONE  Take 0.5 tablets (12.5 mg total) by mouth daily.     sucralfate 1 GM/10ML suspension  Commonly known as:  CARAFATE  Take 10 mLs (1 g total) by mouth 2 (two) times daily before a meal.     traMADol 50 MG tablet  Commonly known as:  ULTRAM  Take 1 tablet (50 mg total) by mouth every 6 (six) hours as needed for moderate pain.     zolpidem 5 MG tablet  Commonly known as:  AMBIEN  Take 1 tablet (5 mg total) by mouth at bedtime as needed for sleep.         Signed: Tarri Fuller, PA-C 05/24/2013, 2:27 PM

## 2013-05-24 NOTE — Progress Notes (Signed)
Subjective: No complaints but RN said he did not sleep well.   Objective: Vital signs in last 24 hours: Temp:  [97.7 F (36.5 C)-98.5 F (36.9 C)] 98.2 F (36.8 C) (04/21 0432) Pulse Rate:  [26-117] 96 (04/21 0432) Resp:  [15-20] 20 (04/21 0432) BP: (111-149)/(83-112) 143/88 mmHg (04/21 0432) SpO2:  [98 %-100 %] 98 % (04/21 0432) Weight:  [161 lb 2.5 oz (73.1 kg)] 161 lb 2.5 oz (73.1 kg) (04/21 0432) Last BM Date: 05/23/13  Intake/Output from previous day: 04/20 0701 - 04/21 0700 In: 360 [P.O.:360] Out: 900 [Urine:900] Intake/Output this shift: Total I/O In: -  Out: 900 [Urine:900]  Medications Current Facility-Administered Medications  Medication Dose Route Frequency Provider Last Rate Last Dose  . acetaminophen (TYLENOL) tablet 650 mg  650 mg Oral Q4H PRN Erlene Quan, PA-C      . ALPRAZolam Duanne Moron) tablet 0.5 mg  0.5 mg Oral TID PRN Erlene Quan, PA-C   0.5 mg at 05/24/13 0537  . aspirin EC tablet 81 mg  81 mg Oral Daily Luke K Kilroy, PA-C      . clopidogrel (PLAVIX) tablet 75 mg  75 mg Oral Q breakfast Luke K Kilroy, PA-C      . furosemide (LASIX) tablet 40 mg  40 mg Oral Daily Luke K Kilroy, PA-C      . guaiFENesin-dextromethorphan (ROBITUSSIN DM) 100-10 MG/5ML syrup 15 mL  15 mL Oral Q4H PRN Alwyn Pea, MD   15 mL at 05/24/13 0157  . heparin injection 5,000 Units  5,000 Units Subcutaneous 3 times per day Sanda Klein, MD   5,000 Units at 05/24/13 0009  . losartan (COZAAR) tablet 25 mg  25 mg Oral Daily Luke K Kilroy, PA-C      . nitroGLYCERIN (NITROSTAT) SL tablet 0.4 mg  0.4 mg Sublingual Q5 Min x 3 PRN Doreene Burke Kilroy, PA-C      . ondansetron [facility]) injection 4 mg  4 mg Intravenous Q6H PRN Doreene Burke Kilroy, PA-C      . pantoprazole (PROTONIX) EC tablet 80 mg  80 mg Oral QHS Doreene Burke Kilroy, PA-C   80 mg at 05/24/13 0009  . potassium chloride SA (K-DUR,KLOR-CON) CR tablet 40 mEq  40 mEq Oral Daily Luke K Kilroy, PA-C      .  spironolactone-hydrochlorothiazide (ALDACTAZIDE) 25-25 MG per tablet 0.5 tablet  0.5 tablet Oral Daily Luke K Kilroy, PA-C      . sucralfate (CARAFATE) 1 GM/10ML suspension 1 g  1 g Oral BID AC Luke K Kilroy, PA-C   1 g at 05/23/13 1833  . traMADol (ULTRAM) tablet 50 mg  50 mg Oral Q6H PRN Erlene Quan, PA-C      . zolpidem (AMBIEN) tablet 5 mg  5 mg Oral QHS PRN Alwyn Pea, MD   5 mg at 05/24/13 0157    PE: General appearance: alert, cooperative and no distress Lungs: clear to auscultation bilaterally Heart: irregularly irregular rhythm Abdomen: BS nontender Extremities: No LEE Pulses: 2+ and symmetric Skin: Warm and dry Neurologic: expressive aphasia, right sided weakness  Lab Results:   Recent Labs  05/22/13 1420 05/23/13 1815  WBC 4.4 4.7  HGB 13.7 14.5  HCT 40.4 42.8  PLT 263 282   BMET  Recent Labs  05/22/13 1420 05/23/13 1815 05/24/13 0323  NA 139  --  141  K 4.7  --  3.7  CL 102  --  104  CO2 26  --  25  GLUCOSE 100*  --  93  BUN 13  --  16  CREATININE 1.26 1.11 1.34  CALCIUM 9.3  --  9.4   PT/INR  Recent Labs  05/22/13 1420  LABPROT 12.3  INR 0.93   Aortic pressure 137/79 (mean 102 ) mm Hg  Left ventricle 923/3 with end-diastolic pressure of 18 mm Hg  PA wedge pressure a wave 21, v wave 20 (mean 16) mm Hg  Pulmonary artery 40/16 (mean 26) mm Hg  Right ventricle 37/2 with an end-diastolic pressure of 5 mm Hg  Right atrium a wave 8, v wave 7 (mean 5) mm Hg  Cardiac output is 6 L per minute (cardiac index 3.1 L per minute per meter sq)  Oxygen saturation: Aortic 96%, pulmonary artery 73%  SVR 1289 dsc, SVRI 2630  PVR 133, PVRI 258   Assessment/Plan  48 year old gentleman presenting with ischemic stroke related to acute occlusion of the right middle cerebral artery treated with intra-arterial reperfusion techniques. He was found to have severe cardiomyopathy with a left ventricular ejection fraction of 20-25%.  Active Problems:   CHF  with cardiomyopathy   NICM (nonischemic cardiomyopathy)   CVA-occluded left MCA-stent  Plan:    SP R/L HC revealing normal coronary arteries and  Elevated RH pressures.  ASA, plavix, lasix 40 po daily, cozaar 25, K 57mEq QD, aldactazide 25/25-half tablet.  Mag 2.1.  A lot of bigeminy on tele.  He is supposed to be on Coreg but for some reason it is not on his med list.  Will add it back now-  3.125.   DC rehab services.    LOS: 1 day    Tarri Fuller PA-C 05/24/2013 6:00 AM     Patient seen and examined. Agree with assessment and plan. Cath findings reviewed. Nonischemic cardiomyopathy with EF 25% and normal coronary arteries. Currently on ASA/Plavix, but with NICM and  CVA  ? consider long term warfarin; need to discuss with neuro since MCA stent done by neuroradiology. Will titrate losartan to 25 mg bid.   Troy Sine, MD, [facility] 05/24/2013 9:45 AM

## 2013-05-25 ENCOUNTER — Inpatient Hospital Stay (HOSPITAL_COMMUNITY): Payer: BC Managed Care – PPO | Admitting: Physical Therapy

## 2013-05-25 ENCOUNTER — Inpatient Hospital Stay (HOSPITAL_COMMUNITY): Payer: BC Managed Care – PPO

## 2013-05-25 DIAGNOSIS — I69991 Dysphagia following unspecified cerebrovascular disease: Secondary | ICD-10-CM

## 2013-05-25 DIAGNOSIS — I69959 Hemiplegia and hemiparesis following unspecified cerebrovascular disease affecting unspecified side: Secondary | ICD-10-CM

## 2013-05-25 DIAGNOSIS — I69922 Dysarthria following unspecified cerebrovascular disease: Secondary | ICD-10-CM

## 2013-05-25 DIAGNOSIS — I6350 Cerebral infarction due to unspecified occlusion or stenosis of unspecified cerebral artery: Secondary | ICD-10-CM

## 2013-05-25 MED ORDER — CITALOPRAM HYDROBROMIDE 10 MG PO TABS
10.0000 mg | ORAL_TABLET | Freq: Every day | ORAL | Status: DC
  Administered 2013-05-25 – 2013-06-08 (×15): 10 mg via ORAL
  Filled 2013-05-25 (×17): qty 1

## 2013-05-25 NOTE — Progress Notes (Signed)
Patient information reviewed and entered into eRehab system by Daiva Nakayama, RN, CRRN, [place] Coordinator.  Information including medical coding and functional independence measure will be reviewed and updated through discharge.    This is an interrupted stay with patient being on acute service 05/23/13 to 05/24/2013 for a procedure.  Records will need to be merged.  BCBS using same authorization number.  Please reference record csn: [id number] for H&P, IPOC, daily notes and goals.  This record is a continuation of the patient stay past 1 day interruption.

## 2013-05-25 NOTE — Plan of Care (Signed)
Problem: RH BLADDER ELIMINATION Goal: RH STG MANAGE BLADDER WITH ASSISTANCE STG Manage Bladder With Minimal Assistance  Outcome: Not Progressing Condom cath intact, managed by staff

## 2013-05-25 NOTE — Progress Notes (Signed)
Occupational Therapy Session Note  Patient Details  Name: Isaac Johnson MRN: [id number] Date of Birth: Jul 04, 1965  Today's Date: 05/25/2013 Time: 0728-0807 Time Calculation (min): 39 min  Short Term Goals: Week 1:     Skilled Therapeutic Interventions/Progress Updates:    Pt seen for ADL retraining with focus on sit<>stand, activity tolerance, and hemi dressing. Pt received supine in bed. Completed supine>sit and HR 43 then increasing to 75 within 5 seconds. Pt with minimal SOB however quickly relieved. Pt completed stand pivot transfer bed>w/c with min assist and HR 78. Engaged in bathing at sink with HR 68-82 throughout sitting task. Completed sit<>stand with min assist for peri cleansing and HR dropping to 37 then increasing to 96 within 5 sec. Pt with min SOB. Allowed rest break for HR to become stable to complete LB dressing per pts request. MD present and notified of irregular heart rate of 37-96. MD with no orders for parameters. Ended therapy session secondary to irregular heart rate, as this clinician did not feel as if it was safe and ethical to provided extensive therapy. Pt requesting to sit up in w/c to eat breakfast however agreeable to return to bed after. Pt's HR 76-78 while sitting in w/c. Notified RN of missed therapy and irregular heart rate. RN agreeable to terminate therapy session.    Precautions:  Restrictions Weight Bearing Restrictions: No General: General Amount of Missed OT Time (min): 21 Minutes Vital Signs: Therapy Vitals Temp: 98.3 F (36.8 C) Temp src: Oral Pulse Rate: 76 Resp: 19 BP: 121/85 mmHg Patient Position, if appropriate: Lying Oxygen Therapy SpO2: 100 % O2 Device: None (Room air) Pain: No report of pain during therapy session.   Other Treatments:    See FIM for current functional status  Therapy/Group: Individual Therapy  Tyria Springer N Corianne Buccellato 05/25/2013, 8:14 AM

## 2013-05-25 NOTE — Progress Notes (Signed)
Speech Language Pathology Daily Session Note  Patient Details  Name: Isaac Johnson MRN: [id number] Date of Birth: 1965-03-15  Today's Date: 05/25/2013 Time: 1700-1749 Time Calculation (min): 45 min  Short Term Goals: Week 2: SLP Short Term Goal 1 (Week 2): Pt. will demonstrate appropriate problem solving abilities during complex activities wtih moderate verbal cues SLP Short Term Goal 2 (Week 2): Pt. will recall and utilize speech strategies in conversation with supervision verbal cues. SLP Short Term Goal 3 (Week 2): Pt. will utilize memory strategies to recall important information with min verbal cues SLP Short Term Goal 4 (Week 2): Pt will demonstrate adequate mastication and oral clearance wtih Dys 3 textures wtih Min cues  Skilled Therapeutic Interventions: Skilled treatment focused on speech and cognitive goals. SLP facilitated session with complex money management task, which pt completed with supervision level cueing and Min A for vision, as pt reported that he did not have his glasses. Pt recalled 4 out of 4 speech intelligibility strategies with Mod I and extra time, and utilized them at the conversational level with Min cues. He used them during structured task at the sentence level with supervision level cueing. SLP facilitated discussion regarding current diet, as pt appears to have been downgraded from Dys 3 textures to Dys 2 since last seen by SLP. Pt reported that he preferred the current textures he was on, but was agreeable to advanced trials in therapy. Continue plan of care.   FIM:  Comprehension Comprehension Mode: Auditory Comprehension: 5-Understands complex 90% of the time/Cues < 10% of the time Expression Expression Mode: Verbal Expression: 5-Expresses basic 90% of the time/requires cueing < 10% of the time. Social Interaction Social Interaction: 5-Interacts appropriately 90% of the time - Needs monitoring or encouragement for participation or interaction. Problem  Solving Problem Solving: 5-Solves basic problems: With no assist Memory Memory: 4-Recognizes or recalls 75 - 89% of the time/requires cueing 10 - 24% of the time FIM - Eating Eating Activity: 6: Modified consistency diet: (comment)  Pain Pain Assessment Pain Assessment: No/denies pain Pain Score: 0-No pain  Therapy/Group: Individual Therapy   Germain Osgood, M.A. CCC-SLP [phone]  Germain Osgood 05/25/2013, 12:24 PM

## 2013-05-25 NOTE — Progress Notes (Signed)
Subjective/Complaints: [name] with history of CHF, stomach ulcers, sciatica LLE, brain lipoma; who was admitted to [facility] on 05/06/13 with right side weakness. He was treated with tPA and transferred to [facility] for further treatment. CT head negative for acute changes. 2D echo with EF 25% with diffuse hypokinesis and grade 2 diastolic dysfunction. Carotid dopplers without significant ICA stenosis. Patient with improvement after tPA . 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  OT noted "irregular HR" Pt denies CP or SOB, states "I get anxious sometimes"  Objective: Vital Signs: Blood pressure 121/85, pulse 76, temperature 98.3 F (36.8 C), temperature source Oral, resp. rate 19, SpO2 100.00%. No results found. Results for orders placed during the hospital encounter of 05/24/13 (from the past 72 hour(s))  CBC     Status: None   Collection Time    05/24/13  4:30 PM      Result Value Ref Range   WBC 4.8  4.0 - 10.5 K/uL   RBC 4.51  4.22 - 5.81 MIL/uL   Hemoglobin 14.4  13.0 - 17.0 g/dL   HCT 42.5  39.0 - 52.0 %   MCV 94.2  78.0 - 100.0 fL   MCH 31.9  26.0 - 34.0 pg   MCHC 33.9  30.0 - 36.0 g/dL   RDW 13.5  11.5 - 15.5 %   Platelets 260  150 - 400 K/uL  CREATININE, SERUM     Status: Abnormal   Collection Time    05/24/13  4:30 PM      Result Value Ref Range   Creatinine, Ser 1.28  0.50 - 1.35 mg/dL   GFR calc non Af Amer 64 (*) >90 mL/min   GFR calc Af Amer 74 (*) >90 mL/min   Comment: (NOTE)     The eGFR has been calculated using the CKD EPI equation.     This calculation has not been validated in all clinical situations.     eGFR's persistently <90 mL/min signify possible Chronic Kidney     Disease.     HEENT:  normal Cardio: RRR and no murmur Resp: CTA B/L and unlabored GI: BS positive and NT.ND Extremity:  Pulses positive and No Edema Skin:   Intact Musc/Skel:  Normal Right hemiparesis UE>LE. Mild sensory deficits on right side. Pt with right central 7 and tongue deviation. Speech dysarthric. Sensation intact to light touch in the upper and lower limbs. motor strength: RUE 3- delt,bi, 2- tri, grip. RLE 3- at HF,KE and KE/KF, 2-/5 at ankle   Assessment/Plan: 1. Functional deficits secondary to L MCA distribution infarct discharging to cardiology for heart cath/ further work up will have rehab admission team to monitor for rehab readmission after caridac w/u and treatment complete FIM:                   Comprehension Comprehension Mode: Auditory Comprehension: 5-Understands complex 90% of the time/Cues < 10% of the time  Expression Expression Mode: Verbal Expression: 5-Expresses basic needs/ideas: With no assist  Social Interaction Social Interaction: 5-Interacts appropriately 90% of the time - Needs monitoring or encouragement for participation or interaction.  Problem Solving Problem Solving: 5-Solves basic problems: With no assist  Memory Memory: 5-Recognizes or recalls 90% of the time/requires cueing < 10% of the  time  Medical Problem List and Plan:  1. 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 Status post revascularization with clot retrieval  2. DVT Prophylaxis/Anticoagulation: SCDs. Monitor for any signs of DVT  3. Pain Management: Tylenol as needed  4. Mood/anxiety: Xanax 0.25 mg twice a day, Ativan 1 mg every 6 as as needed anxiety. Bed alarm for safety  5. Neuropsych: This patient is capable of making decisions on his own behalf.  6. Dysphagia. Dysphagia 2 thin liquids. Followup speech therapy  7.Hypertension. Cozaar 25 mg daily, Lasix 20 mg daily, Aldactone 12.5 mg daily. Monitor with increased mobility  8.  Cardiomyopathy/CHF. Monitor for any signs of fluid overload. Followup cardiology services.  Non ischemic, normal coronaries on cath 9.  UTI- citrobacter sens Cefazolin--abx stopped 10.  Nausea: improved 11. Cardiac arrythmia- bigeminy noted on monitor coreg resumed, normal exam today  LOS (Days) 1 A FACE TO FACE EVALUATION WAS PERFORMED  Charlett Blake 05/25/2013, 8:04 AM

## 2013-05-25 NOTE — Plan of Care (Signed)
Problem: RH BLADDER ELIMINATION Goal: RH STG MANAGE BLADDER WITH ASSISTANCE STG Manage Bladder With Minimal Assistance  Outcome: Not Progressing Continues to use condom cath . Off during the day . Assist with urinal

## 2013-05-25 NOTE — Progress Notes (Signed)
Social Work Patient ID: Isaac Johnson, male   DOB: October 25, 1965, 48 y.o.   MRN: [id number] Met with pt to inform of team conference goals-supervision level and new discharge date 5/6.  He is aware ot the time lost with HR issues. His progress is slower due to heart issues and needing multiple rest breaks throughout the day and during his sessions with therapies. Will discuss with wife when here later today.  Will make aware he will need 24 hour supervision, but have discussed this with her before.

## 2013-05-25 NOTE — Patient Care Conference (Signed)
Inpatient RehabilitationTeam Conference and Plan of Care Update Date: 05/25/2013   Time: 11;00 AM    Patient Name: Isaac Johnson      Medical Record Number: [id number]  Date of Birth: January 24, 1966 Sex: Male         Room/Bed: 4W07C/4W07C-01 Payor Info: Payor: [place] / Plan: BCBS [place] PPO / Product Type: *No Product type* /    Admitting Diagnosis: CVA  Admit Date/Time:  05/24/2013  3:48 PM Admission Comments: No comment available   Primary Diagnosis:  <principal problem not specified> Principal Problem: <principal problem not specified>  Patient Active Problem List   Diagnosis Date Noted  . CHF with cardiomyopathy 05/23/2013  . NICM (nonischemic cardiomyopathy) 05/23/2013  . Bradycardia 05/17/2013  . Acute systolic heart failure   . 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 Anxiety state, unspecified 05/12/2013  . CVA (cerebral infarction) 05/12/2013  . Systolic dysfunction -EF 61% by 2D echo 05/2013 05/11/2013  . 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 LEUKOPENIA, MILD 10/03/2008  . OTHER ABNORMAL BLOOD CHEMISTRY 10/03/2008  . PROTEINURIA 08/20/2006  . HYPERTENSION 07/23/2006  . GERD 07/23/2006    Expected Discharge Date: Expected Discharge Date: 06/08/13  Team Members Present: Physician leading conference: Dr. Alysia Penna Social Worker Present: Ovidio Kin, LCSW Nurse Present: Elliot Cousin, RN PT Present: Georjean Mode, PT;Blair Hobble, PT OT Present: Gareth Morgan, OT SLP Present: Gunnar Fusi, SLP PPS Coordinator present : Ileana Ladd, Lelan Pons, RN, CRRN     Current Status/Progress Goal Weekly Team Focus  Medical   anxiety,   slowly advance activty  monitor sats and clinically rather than HR parameters   Bowel/Bladder   cont of bowel, incontinent of bladder, condom cath at HS  cont of B&B with min assist  timed toileting; assess need continued need for condom cath at [facility]   Swallow/Nutrition/ Hydration   Dys.2 textures and thin liquids with intermittent supervision   supervision with least restrictive PO  trials of Dys.3 textures, diet toleration   ADL's   min assist functional transfers, mod assist UB dressing, max assist LB dressing, min assist bathing  supervision overall  activity tolerance, functional transfers, R NMR, standing balance, hemi dressing, family/pt education   Mobility   Min A with transfers, mod A with gait x10'; participation limited by labored breathing with exertion  Supervision overall  Transfer training, postural/gait stability, activity tolerance, stair negotiation, R NMR, hands-on family training   Communication   Min-Mod assist cues for speech strategies at the sentence level   Min assist   increase use of strategeis    Safety/Cognition/ Behavioral Observations  Min assist for sequencing and recall of information  Min assist   complex problem solving, recall of information, use of memory aids   Pain   n/a  remain pain free  monitor   Skin   CDI  no skin infection or skin breakdown  assess skin qshift and prn      *See Care Plan and progress notes for long and short-term goals.  Barriers to Discharge: see above    Possible Resolutions to Barriers:  add celexa, neuropsych re eval    Discharge Planning/Teaching Needs:    Home with wife who can get someone to be with him while she is working-trying to change her shift.     Team Discussion:  Goals supervision level-feeling better after cardiac cath.  Anxiety issues-adjusting meds.Low endurance needs to give him plenty of rest breaks.  Check  O2 sats if becomes SOB.  No parameters per MD for HR.  [place]-psych to see again  Revisions to Treatment Plan:  Return to rehab after cardiac cath   Continued Need for Acute Rehabilitation Level of Care: The patient requires daily medical management by a physician with specialized training in physical medicine and rehabilitation for the following conditions: Daily  direction of a multidisciplinary physical rehabilitation program to ensure safe treatment while eliciting the highest outcome that is of practical value to the patient.: Yes Daily medical management of patient stability for increased activity during participation in an intensive rehabilitation regime.: Yes Daily analysis of laboratory values and/or radiology reports with any subsequent need for medication adjustment of medical intervention for : Neurological problems;Cardiac problems  Elease Hashimoto 05/25/2013, 3:10 PM

## 2013-05-25 NOTE — Progress Notes (Signed)
Patient assessed sitting up in wheelchair after therapy session stopped . Patient noted with mild anxiety and offerred xanax . Patient refused to take xanax at this time . Blood pressure 108/75 pulse 78. Pulse fluctuates between 58- 88 sitting up in chair .pateint denies shortness of breath but reports feeling flushed at times . Patient assisted in bed to rest between therapy sessions . D. [place] PA notified of blood pressure . No new orders at this time .            Isaac Johnson

## 2013-05-25 NOTE — Progress Notes (Signed)
Physical Therapy Session Note  Patient Details  Name: Isaac Johnson MRN: [id number] Date of Birth: Nov 19, 1965  Today's Date: 05/25/2013 Time: 0240-9735 and  Time Calculation (min): 54 min  Short Term Goals: Week 2:  PT Short Term Goal 1 (Week 2): Pt will perform bed<>chair with close supervision to min guard. PT Short Term Goal 2 (Week 2): Pt will perform gait x50' in controlled environment with +1Total A. PT Short Term Goal 3 (Week 2): Pt will perform w/c mobility x100' in controlled environment with supervision. PT Short Term Goal 4 (Week 2): Pt will negotiate 3 steps with single hand rail and max A.  Skilled Therapeutic Interventions/Progress Updates:    Treatment Session 1: Pt received seated in w/c; agreeable to therapy. Session focused on increasing activity tolerance, stability/independence with functional transfers and gait. Transported pt in w/c to gym, where pt performed multiple w/c<>mat table transfers with close supervision to min A, mod verbal cueing for setup, and subtle to min cueing for hand placement during transfer. Sit<>stand transfers from w/c level mat table with min guard. Static standing x45 seconds with min guard, novovert LOB but multidirectional postural sway noted. Gait x10' in controlled environment with rolling walker, R hand orthosis, mod A; multimodal cueing and manual facilitation at R axilla and L ribcage for upright posture. Verbal cueing provided throughout session to remind pt to breathe secondary to noted tendency to stop breathing for up to 10 seconds, then demonstrate labored breathing. Transported pt to room, where pt was left seated in w/c in no apparent distress with all needs within reach. RN notified of session.  Prolonged seated rest breaks required throughout session secondary to labored breathing following each transfer, short-distance gait trial, activity. Therapist resumed activity when pt perceived no SOB,  respiratory rate was <20 breaths/min, and  HR was stable.   Treatment Session 2: Pt received semi-reclined in bed; agreeable to therapy. Session focused on continued gait training, increasing pt independence with safe setup of transfers. Supine<>sit with supervision (for safety), HOB flat, no rails; no cueing required for technique. Gait x19' with rolling walker, R hand orthosis, min A and multimodal cueing for upright posture to prevent R hip retraction and R genu recurvatum in during RLE mid-terminal stance phase of gait. Hait trial ended per pt request secondary to SOB. Post-gait HR 85 (regular), SpO2 90%; increased to 97% within 10 seconds with cueing for slow, diaphragmatic breathing. Gait trial performed without AFO, as pt demonstrates improved active R ankle dorsiflexion against gravity.  Performed w/c<>bed transfers x2 with min guard-min A; focus on increasing independence with setup, promoting anterior weight shifting during transfer. Despite max encouragement to stay seated in w/c, pt insisting to transfer into bed. Therapist departed with pt semi-reclined in bed with 3 bed rails up, bed alarm on, and all needs within reach. RN notified of session, decrease in SpO2 with activity.  Therapy Documentation Precautions:  Restrictions Weight Bearing Restrictions: No General: Amount of Missed PT Time (min): 6 Minutes Missed Time Reason: Patient fatigue Vital Signs: Therapy Vitals Pulse Rate: 85 Patient Position, if appropriate: Sitting (Immediately following gait x18') Oxygen Therapy SpO2: 90 % (Increased to 97% within 10 seconds with cueing for breathing) O2 Device: None (Room air) Pain: Pain Assessment Pain Assessment: No/denies pain Locomotion : Ambulation Ambulation/Gait Assistance: 4: Min assist   See FIM for current functional status  Therapy/Group: Individual Therapy  Malva Cogan Hobble 05/25/2013, 2:04 PM

## 2013-05-26 ENCOUNTER — Encounter (HOSPITAL_COMMUNITY): Payer: BC Managed Care – PPO

## 2013-05-26 ENCOUNTER — Inpatient Hospital Stay (HOSPITAL_COMMUNITY): Payer: BC Managed Care – PPO | Admitting: Physical Therapy

## 2013-05-26 ENCOUNTER — Inpatient Hospital Stay (HOSPITAL_COMMUNITY): Payer: BC Managed Care – PPO

## 2013-05-26 DIAGNOSIS — I6350 Cerebral infarction due to unspecified occlusion or stenosis of unspecified cerebral artery: Secondary | ICD-10-CM

## 2013-05-26 MED ORDER — LORATADINE 10 MG PO TABS
10.0000 mg | ORAL_TABLET | Freq: Every day | ORAL | Status: DC
  Administered 2013-05-26 – 2013-06-07 (×13): 10 mg via ORAL
  Filled 2013-05-26 (×15): qty 1

## 2013-05-26 NOTE — Progress Notes (Signed)
Speech Language Pathology Daily Session Note  Patient Details  Name: Isaac Johnson MRN: [id number] Date of Birth: [birth year]  Today's Date: 05/26/2013 Time: 1400-1440 Time Calculation (min): 40 min  Short Term Goals: Week 2: SLP Short Term Goal 1 (Week 2): Pt. will demonstrate appropriate problem solving abilities during complex activities wtih moderate verbal cues SLP Short Term Goal 2 (Week 2): Pt. will recall and utilize speech strategies in conversation with supervision verbal cues. SLP Short Term Goal 3 (Week 2): Pt. will utilize memory strategies to recall important information with min verbal cues SLP Short Term Goal 4 (Week 2): Pt will demonstrate adequate mastication and oral clearance wtih Dys 3 textures wtih Min cues  Skilled Therapeutic Interventions: Skilled treatment focused on swallowing and speech goals. SLP facilitated session with observation of trials of DYs 3 textures and thin liquids via cup sips, which pt consumed with wet vocal quality x1, which returned to baseline with reflexive throat clear. Pt did not require cues for use of safe swallowing strategies. SLP provided encouragement to patient to try more advanced solids, however at this time pt continues to prefer Dys 2 textures, although acknowledging that he "can't stay on them forever." Pt recalled 3 out of 4 speech intelligibility strategies without cues, and utilized them during tongue twister exercises with Min cues for self-monitoring and self-correcting. At the end of treatment, pt was returned to bed with all needs within reach. Continue plan of care.   FIM:  Comprehension Comprehension Mode: Auditory Comprehension: 5-Follows basic conversation/direction: With extra time/assistive device Expression Expression Mode: Verbal Expression: 5-Expresses basic 90% of the time/requires cueing < 10% of the time. Social Interaction Social Interaction: 6-Interacts appropriately with others with medication or extra time  (anti-anxiety, antidepressant). Problem Solving Problem Solving: 5-Solves basic problems: With no assist Memory Memory: 6-More than reasonable amt of time FIM - Eating Eating Activity: 5: Set-up assist for open containers;6: Swallowing techniques: self-managed  Pain Pain Assessment Pain Assessment: No/denies pain  Therapy/Group: Individual Therapy   Germain Osgood, M.A. CCC-SLP [phone]  Germain Osgood 05/26/2013, 3:20 PM

## 2013-05-26 NOTE — Progress Notes (Signed)
Physical Therapy Session Note  Patient Details  Name: Isaac Johnson MRN: [id number] Date of Birth: February 26, 1965  Today's Date: 05/26/2013 Time: 7106-2694 and 1300-1330 Time Calculation (min): 56 min and 30 min  Short Term Goals: Week 2:  PT Short Term Goal 1 (Week 2): Pt will perform bed<>chair with close supervision to min guard. PT Short Term Goal 2 (Week 2): Pt will perform gait x50' in controlled environment with +1Total A. PT Short Term Goal 3 (Week 2): Pt will perform w/c mobility x100' in controlled environment with supervision. PT Short Term Goal 4 (Week 2): Pt will negotiate 3 steps with single hand rail and max A.  Skilled Therapeutic Interventions/Progress Updates:    Treatment Session 1: Pt received semi-reclined in bed with RN present; agreeable to therapy. Resting HR WNL and not fluctuating; no SOB noted. Performed supine<>sit with supervision, HOB flat, no rails. Squat pivot transfer from bed<>w/c with min A. Transported pt to gym in w/c, where pt performed bed squat pivot transfer from w/c>mat table. Pt seated EOM x5 minutes with bilat UE/LU support, supervision; verbal cueing for slow, diaphragmatic breathing secondary to rapid breathing, excessive use of accessory breathing muscles. Pt stating that right LE feeling "numb" and "weak"; MMT of RLE reveals no significant change in RLE strength. Seated vitals: BP 126/90, HR 107, SpO2 83%, increased to 92% with cueing for slow breathing, relaxation. Notified RN, Angie, of pt presentation. RN presented to gym, assisted with cueing pt to utilize breathing techniques, administered medication, and assisted in repositioned pt into supine with bilat LE's elevated. When pt seated EOB, demonstrating decreased SOB, performed squat pivot transfer from mat table>w/c with min A. Transported pt in w/c to pt room, where pt was left semi-reclined in bed with 3 bed rails up, bed alarm on, and all needs within reach. Pt in no apparent distress.  Treatment  Session 2: Pt received semi reclined in bed; agreeable to session. States feeling "better" since AM session. Current session focused on sit<>stand transfers, gait training. Resting HR 89, SpO2 98%. Per pt need to change brief and pants (as pt became soiled while attempting to use urinal), performed static sitting balance with close supervision. Multiple sit<>stand transfers from EOB without assistive device with supervision; subtle to min cueing for setup (RLE positioning, hand placement) with inconsistent within-session carryover. Performed static standing balance with LUE support at rolling walker while donning brief, pants. Pt tolerated multiple sit<>stands without labored breathing, pt-perceived SOB, or significant change in HR or SpO2 levels.   Performed gait x50' in controlled environment with rolling walker, R hand orthosis, and min A; manual facilitation at posterior R ribcage, anterior to R shoulder for upright posture, verbal postural cues for reinforcement. Verbal cueing also provided to remind pt to breathe during gait trial secondary to pt-demonstrated tendency to hold breath. Gait trial ended secondary to increased RR, onset of excessive use of accessory breathing musculature; HR 30, SpO2 84%. Within 15 seconds of seated rest break, HR 61, SpO2 95%. Session ended in pt room, where pt was left seated in w/c in no apparent distress with all needs within reach.   Therapy Documentation Precautions:  Restrictions Weight Bearing Restrictions: No Vital Signs: Therapy Vitals Pulse Rate: 74 BP: 126/90 mmHg Patient Position, if appropriate: Lying Oxygen Therapy SpO2: 98 % O2 Device: None (Room air) Pain: Pain Assessment Pain Assessment: No/denies pain Pain Score: 0-No pain  See FIM for current functional status  Therapy/Group: Individual Therapy  Malva Cogan Cecillia Menees 05/26/2013, 11:58 AM

## 2013-05-26 NOTE — Progress Notes (Signed)
Occupational Therapy Session Note  Patient Details  Name: Isaac Johnson MRN: [id number] Date of Birth: [birth year]  Today's Date: 05/26/2013 Time: 0729-0825 Time Calculation (min): 56 min  Short Term Goals: Week 2:  OT Short Term Goal 1 (Week 2): RUE:  Patient will position and monitor RUE at all times during BADL and functional mobility with no more than 4 vcs per session OT Short Term Goal 2 (Week 2): Patient will complete 3/3 toilet tasks with steadying assist only OT Short Term Goal 3 (Week 2): Patient will complete LB dressing with min assist  OT Short Term Goal 4 (Week 2): Patient will initiate use of RUE as stabilizer during self-care task with no cues  Skilled Therapeutic Interventions/Progress Updates:    Pt seen for ADL retraining with focus on functional transfers, activity tolerance, functional use of RUE, and hemi dressing technique. Pt received supine in bed declining shower, however asking to bathe at sink. Discussed removal of condom cath to address bladder management and pt agreeable with min cues of encouragement. Pt reports he spills urinal when attempting to use it. Practiced management of urinal without clothing and cues for use of RUE as stabilizer. Pt demonstrating improvement on managing urinal. Discussed timed toileting as well and notified RN of this. Pt completed stand pivot transfer bed>w/c with cues for UE placement. Engaged in bathing at sink with emphasis on use of RUE. Therapist provided min assist for stabilization at wrist to wring out wash cloth multiple times with bil hands. Pt washed 75% of LUE using RUE however fatigued quickly with task. Pt also utilized RUE to wash approx 50% of upper RLE. Completed sit<>stand x4 with min assist and verbal cues each time for hand placement. Pt required min assist for standing balance unsupported when managing clothing around waist. Pt required mod cues for hemi dressing technique and progressed to min assist UB and LB dressing  this AM. Pt required frequent rest breaks throughout session d/t SOB. Discussed importance of taking rest breaks with signs of fatigue and pt agreeable. HR flucuated at times 48-92 however more consistently between 70-78. At end of session pt left with breakfast tray and RN present.  Therapy Documentation Precautions:  Restrictions Weight Bearing Restrictions: No General:   Vital Signs: Therapy Vitals Temp: 97.7 F (36.5 C) Temp src: Oral Pulse Rate: 72 Resp: 18 BP: 120/88 mmHg Patient Position, if appropriate: Lying Oxygen Therapy SpO2: 97 % O2 Device: None (Room air) Pain: No report of pain during therapy session.     Other Treatments:    See FIM for current functional status  Therapy/Group: Individual Therapy  Quillian Quince Elmarie Devlin 05/26/2013, 8:43 AM

## 2013-05-26 NOTE — Progress Notes (Signed)
Subjective/Complaints: [name] with history of CHF, stomach ulcers, sciatica LLE, brain lipoma; who was admitted to [facility] on 05/06/13 with right side weakness. He was treated with tPA and transferred to [facility] for further treatment. CT head negative for acute changes. 2D echo with EF 25% with diffuse hypokinesis and grade 2 diastolic dysfunction. Carotid dopplers without significant ICA stenosis. Patient with improvement after tPA . 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 Tolerating ADL session Discussed freq rest breaks secondary to "weak heart" Pt denies CP or SOB,feels congested at night, needs to spit and clear throat frequently  Objective: Vital Signs: Blood pressure 123/85, pulse 79, temperature 97.7 F (36.5 C), temperature source Oral, resp. rate 18, SpO2 97.00%. No results found. Results for orders placed during the hospital encounter of 05/24/13 (from the past 72 hour(s))  CBC     Status: None   Collection Time    05/24/13  4:30 PM      Result Value Ref Range   WBC 4.8  4.0 - 10.5 K/uL   RBC 4.51  4.22 - 5.81 MIL/uL   Hemoglobin 14.4  13.0 - 17.0 g/dL   HCT 42.5  39.0 - 52.0 %   MCV 94.2  78.0 - 100.0 fL   MCH 31.9  26.0 - 34.0 pg   MCHC 33.9  30.0 - 36.0 g/dL   RDW 13.5  11.5 - 15.5 %   Platelets 260  150 - 400 K/uL  CREATININE, SERUM     Status: Abnormal   Collection Time    05/24/13  4:30 PM      Result Value Ref Range   Creatinine, Ser 1.28  0.50 - 1.35 mg/dL   GFR calc non Af Amer 64 (*) >90 mL/min   GFR calc Af Amer 74 (*) >90 mL/min   Comment: (NOTE)     The eGFR has been calculated using the CKD EPI equation.     This calculation has not been validated in all clinical situations.     eGFR's  persistently <90 mL/min signify possible Chronic Kidney     Disease.     HEENT: normal Cardio: RRR and no murmur Resp: CTA B/L and unlabored GI: BS positive and NT.ND Extremity:  Pulses positive and No Edema Skin:   Intact Musc/Skel:  Normal Right hemiparesis UE>LE. Mild sensory deficits on right side. Pt with right central 7 and tongue deviation. Speech dysarthric. Sensation intact to light touch in the upper and lower limbs. motor strength: RUE 3- delt,bi, 2- tri, grip. RLE 3- at HF,KE and KE/KF, 2-/5 at ankle   Assessment/Plan: 1. Functional deficits secondary to L MCA distribution infarct discharging to cardiology for heart cath/ further work up will have rehab admission team to monitor for rehab readmission after caridac w/u and treatment complete FIM: FIM - Bathing Bathing Steps Patient Completed: Chest;Abdomen;Right upper leg;Left upper leg;Right Arm;Right lower leg (including foot);Left lower leg (including foot);Buttocks;Front perineal area Bathing: 4: Min-Patient completes 8-9 [id number] parts or 75+ percent  FIM - Upper Body Dressing/Undressing Upper body dressing/undressing steps patient completed: Put head through opening of pull over shirt/dress;Pull shirt over trunk Upper body dressing/undressing: 3: Mod-Patient completed 50-74% of tasks FIM - Lower Body Dressing/Undressing Lower body dressing/undressing steps patient completed: Pull underwear up/down;Pull pants  up/down Lower body dressing/undressing: 2: Max-Patient completed 25-49% of tasks  FIM - Toileting Toileting steps completed by patient: Adjust clothing prior to toileting;Performs perineal hygiene Toileting: 3: Mod-Patient completed 2 of 3 steps  FIM - Toilet Transfers Toilet Transfers: 4-To toilet/BSC: Min A (steadying Pt. > 75%);4-From toilet/BSC: Min A (steadying Pt. > 75%)  FIM - Bed/Chair Transfer Bed/Chair Transfer Assistive Devices: Arm rests Bed/Chair Transfer: 5: Sit > Supine: Supervision (verbal  cues/safety issues);4: Bed > Chair or W/C: Min A (steadying Pt. > 75%);4: Chair or W/C > Bed: Min A (steadying Pt. > 75%)  FIM - Locomotion: Wheelchair Locomotion: Wheelchair: 1: Total Assistance/staff pushes wheelchair (Pt<25%) FIM - Locomotion: Ambulation Locomotion: Ambulation Assistive Devices: Orthosis;Walker - Rolling (R hand orthosis) Ambulation/Gait Assistance: 4: Min assist Locomotion: Ambulation: 1: Travels less than 50 ft with minimal assistance (Pt.>75%)  Comprehension Comprehension Mode: Auditory Comprehension: 5-Understands complex 90% of the time/Cues < 10% of the time  Expression Expression Mode: Verbal Expression: 5-Expresses basic 90% of the time/requires cueing < 10% of the time.  Social Interaction Social Interaction: 5-Interacts appropriately 90% of the time - Needs monitoring or encouragement for participation or interaction.  Problem Solving Problem Solving: 5-Solves basic problems: With no assist  Memory Memory: 4-Recognizes or recalls 75 - 89% of the time/requires cueing 10 - 24% of the time  Medical Problem List and Plan:  1. 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 Status post revascularization with clot retrieval  2. DVT Prophylaxis/Anticoagulation: SCDs. Monitor for any signs of DVT  3. Pain Management: Tylenol as needed  4. Mood/anxiety: Xanax 0.25 mg twice a day, Ativan 1 mg every 6 as as needed anxiety. Bed alarm for safety  5. Neuropsych: This patient is capable of making decisions on his own behalf.  6. Dysphagia. Dysphagia 2 thin liquids. Followup speech therapy  7.Hypertension. Cozaar 25 mg daily, Lasix 20 mg daily, Aldactone 12.5 mg daily. Monitor with increased mobility  8. Cardiomyopathy/CHF. Monitor for any signs of fluid overload. Followup cardiology services.  Non ischemic, normal coronaries on cath, will need freq rest breaks 9.  UTI- treated 10.  Nausea: improved 11. Cardiac arrythmia-  bigeminy noted on monitor coreg resumed, normal exam today 12.  Post nasal drip, start claritin at hs LOS (Days) 2 A FACE TO FACE EVALUATION WAS PERFORMED  Charlett Blake 05/26/2013, 7:42 AM

## 2013-05-26 NOTE — Consult Note (Signed)
  Neuropsychology Psychosocial Evaluation - [facility] _____________________________________________________________________________   Per request from the treatment team, I met with [name] to provide supportive therapy and reassess mood in the setting of stroke.  [name] stated that he was feeling better since our meeting last week.  He acknowledged continued anxious mood, which he experiences daily for a few minutes at a time.  [name] stated that when he feels anxious, he takes a few moments to do some deep breathing, which helps him to relax in order to continue engaging in whatever activity he was originally doing.  Regarding current stressors, [name] noted that he feels frustrated with having multiple medical issues to deal with simultaneously.  In addition, he mentioned that he struggles to deal with the impact that these multiple issues have on his ability to actively engage in his therapies.  For example, he said that some days he is able to participate more fully and do more than on other days.  He expressed fear that he is "disappointing" his therapists on days when his limitations are more pronounced.  Time was spent today challenging his thoughts and encouraging him to remind himself of the true expectations of the therapists (e.g. that they just want him to try his hardest, etc.).  [name] continued to express that he was open to engaging in individual psychotherapy post-discharge.  Therefore, a list of providers in his area should be given to him with his discharge paperwork.    DIAGNOSES: CVA Unspecified Anxiety Disorder Greater than 50% of this visit was spent educating the patient about the possible diagnosis, prognosis, management plan, and in coordination of care.   Marlane Hatcher, PsyD Clinical Neuropsychologist

## 2013-05-26 NOTE — Progress Notes (Signed)
Called to gym approximately 0900 by therapy . Patient complained of right side "feeling tingling" patient able to move extremity upper and lower minimally as previously assessed . Patient denied blurred vision speech clear remains the same . Patient noted with increased respirations o2 sat high 80's per [place], PT. Heart rate 105. Instructed  patient to slow breathing and assisted in relaxation techniques. Patient sitting at edge of mat . Heart ranging from 50's to low 100's . Xanax 0.5mg  given . Patient assisted in lying position and decreased anxiety. Patient reported feeling better no shortness of breath . Tingling in right arm and leg decreasing . Patient assist back to bed by therapy . Patient reports no tingling in right side and resting in bed at this time . [name] PA aware. Continue with plan of care .        Isaac Johnson

## 2013-05-27 ENCOUNTER — Inpatient Hospital Stay (HOSPITAL_COMMUNITY): Payer: BC Managed Care – PPO

## 2013-05-27 ENCOUNTER — Inpatient Hospital Stay (HOSPITAL_COMMUNITY): Payer: BC Managed Care – PPO | Admitting: Physical Therapy

## 2013-05-27 ENCOUNTER — Encounter (HOSPITAL_COMMUNITY): Payer: BC Managed Care – PPO

## 2013-05-27 MED ORDER — FLUTICASONE PROPIONATE 50 MCG/ACT NA SUSP
1.0000 | Freq: Every day | NASAL | Status: DC
  Administered 2013-05-27 – 2013-06-08 (×13): 1 via NASAL
  Filled 2013-05-27: qty 16

## 2013-05-27 NOTE — Progress Notes (Signed)
Occupational Therapy Session Note  Patient Details  Name: ANTAR MILKS MRN: [id number] Date of Birth: [birth year]  Today's Date: 05/27/2013 Time: 0900-1000 and 7989-2119  Time Calculation (min): 60 min and 32 min   Short Term Goals: Week 2:  OT Short Term Goal 1 (Week 2): RUE:  Patient will position and monitor RUE at all times during BADL and functional mobility with no more than 4 vcs per session OT Short Term Goal 2 (Week 2): Patient will complete 3/3 toilet tasks with steadying assist only OT Short Term Goal 3 (Week 2): Patient will complete LB dressing with min assist  OT Short Term Goal 4 (Week 2): Patient will initiate use of RUE as stabilizer during self-care task with no cues  Skilled Therapeutic Interventions/Progress Updates:    Session 1: Pt seen for ADL retraining with focus on functional transfers, standing balance, R NMR, and activity tolerance. Pt received sitting in w/c and agreeable to shower this AM. Completed stand pivot transfer w/c<>shower with min assist using grab bars and cues for hand placement. Completed bathing with min assist to wash entire LUE and cues of encouragement to use RUE for washing 50% of arm. Pt required frequent rest breaks throughout bathing and dressing secondary to SOB and fatigue. Pt completed sit<>stand x5 during session requiring verbal cues for hand placement and for RLE positioning on each trial. Pt required cues for sequencing during hemi dressing technique with increased success noted in donning shirt. Therapist providing multiple cues of encouragement throughout session to decrease anxiety. Had pt identify 2 things he has noticed improvement in during therapy to increase confidence as well. Pt completed oral from w/c level with assist for using LUE as stabilizer. At end of session pt left sitting in w/c with all needs in reach. Pt SpO2>92% throughout and HR ranging 55-98.   Session 2: Pt seen for 1:1 OT session with focus on relaxation techniques,  functional transfers, and R NMR. Pt received sitting in w/c following speech therapy. Pt immediately demonstrating SOB and some coughing. Vitals taken and SpO2>96% and HR 85-113. Attempted to take BP however pt demonstrating increase anxiety with this. Educated on IT trainer. Notified RN who provided medication for anxiety. Pt willing to continue therapy in room with focus on grasp/release and reach with RUE. Pt grasping cup and placing past midline and back x5 trials with rest breaks. Pt required mod cues for independent use of RUE and he was attempting to assist with LUE. At end of session pt returned to bed at min assist and left with all needs in reach. Pt with no SOB upon return to room.  Therapy Documentation Precautions:  Restrictions Weight Bearing Restrictions: No General:   Vital Signs:   Pain: No report of pain during therapy sessions.   See FIM for current functional status  Therapy/Group: Individual Therapy  Teegan Guinther N Lillyrose Reitan 05/27/2013, 12:13 PM

## 2013-05-27 NOTE — Progress Notes (Signed)
Subjective/Complaints: [name] with history of CHF, stomach ulcers, sciatica LLE, brain lipoma; who was admitted to [facility] on 05/06/13 with right side weakness. He was treated with tPA and transferred to [facility] for further treatment. CT head negative for acute changes. 2D echo with EF 25% with diffuse hypokinesis and grade 2 diastolic dysfunction. Carotid dopplers without significant ICA stenosis. Patient with improvement after tPA . 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  Occ cough, clearing throat nasal congestion, discussed that antihistamines can be used but not oral decongestants secondary to cardiac issues Pt denies CP or SOB,feels congested at night, needs to spit and clear throat frequently  Objective: Vital Signs: Blood pressure 123/71, pulse 76, temperature 97.9 F (36.6 C), temperature source Oral, resp. rate 18, SpO2 97.00%. No results found. Results for orders placed during the hospital encounter of 05/24/13 (from the past 72 hour(s))  CBC     Status: None   Collection Time    05/24/13  4:30 PM      Result Value Ref Range   WBC 4.8  4.0 - 10.5 K/uL   RBC 4.51  4.22 - 5.81 MIL/uL   Hemoglobin 14.4  13.0 - 17.0 g/dL   HCT 42.5  39.0 - 52.0 %   MCV 94.2  78.0 - 100.0 fL   MCH 31.9  26.0 - 34.0 pg   MCHC 33.9  30.0 - 36.0 g/dL   RDW 13.5  11.5 - 15.5 %   Platelets 260  150 - 400 K/uL  CREATININE, SERUM     Status: Abnormal   Collection Time    05/24/13  4:30 PM      Result Value Ref Range   Creatinine, Ser 1.28  0.50 - 1.35 mg/dL   GFR calc non Af Amer 64 (*) >90 mL/min   GFR calc Af Amer 74 (*) >90 mL/min   Comment: (NOTE)     The eGFR has been calculated using the CKD EPI equation.     This calculation has  not been validated in all clinical situations.     eGFR's persistently <90 mL/min signify possible Chronic Kidney     Disease.     HEENT: normal Cardio: RRR and no murmur Resp: CTA B/L and unlabored GI: BS positive and NT.ND Extremity:  Pulses positive and No Edema Skin:   Intact Musc/Skel:  Normal Right hemiparesis UE>LE. Mild sensory deficits on right side. Pt with right central 7  Speech dysarthric. Sensation intact to light touch in the upper and lower limbs. motor strength: RUE 3- delt,bi, 3- tri, grip. RLE 3- at HF,KE and KE/KF, 2-/5 at ankle   Assessment/Plan: 1. Functional deficits secondary to L MCA distribution infarct tolerating rehab therapies Requires CIR level for functional and medical needs Physiatrist and Rehab RN  to provide 24/7 care with 3hr per day therapies FIM - Bathing Bathing Steps Patient Completed: Chest;Abdomen;Right upper leg;Left upper leg;Right Arm;Right lower leg (including foot);Left lower leg (including foot);Buttocks;Front perineal area Bathing: 4: Min-Patient completes 8-9 6f10 parts or 75+ percent  FIM - Upper Body Dressing/Undressing Upper body dressing/undressing steps patient completed: Thread/unthread left sleeve of pullover shirt/dress;Pull shirt over trunk;Put head through opening of pull over shirt/dress Upper body dressing/undressing: 4: Min-Patient completed 75 plus % of tasks FIM -  Lower Body Dressing/Undressing Lower body dressing/undressing steps patient completed: Pull underwear up/down;Pull pants up/down;Thread/unthread left underwear leg;Thread/unthread left pants leg;Thread/unthread right pants leg;Don/Doff right sock;Don/Doff left sock Lower body dressing/undressing: 4: Min-Patient completed 75 plus % of tasks  FIM - Toileting Toileting steps completed by patient: Performs perineal hygiene Toileting: 2: Max-Patient completed 1 of 3 steps  FIM - Radio producer Devices: Grab bars Toilet Transfers:  3-To toilet/BSC: Mod A (lift or lower assist);3-From toilet/BSC: Mod A (lift or lower assist)  FIM - Bed/Chair Transfer Bed/Chair Transfer Assistive Devices: Arm rests Bed/Chair Transfer: 5: Supine > Sit: Supervision (verbal cues/safety issues);4: Bed > Chair or W/C: Min A (steadying Pt. > 75%);4: Chair or W/C > Bed: Min A (steadying Pt. > 75%)  FIM - Locomotion: Wheelchair Locomotion: Wheelchair: 0: Activity did not occur FIM - Locomotion: Ambulation Locomotion: Ambulation Assistive Devices: Orthosis;Walker - Rolling (R hand orthosis) Ambulation/Gait Assistance: 4: Min assist Locomotion: Ambulation: 2: Travels 50 - 149 ft with minimal assistance (Pt.>75%)  Comprehension Comprehension Mode: Auditory Comprehension: 5-Follows basic conversation/direction: With extra time/assistive device  Expression Expression Mode: Verbal Expression: 5-Expresses basic 90% of the time/requires cueing < 10% of the time.  Social Interaction Social Interaction: 6-Interacts appropriately with others with medication or extra time (anti-anxiety, antidepressant).  Problem Solving Problem Solving: 5-Solves basic problems: With no assist  Memory Memory: 6-More than reasonable amt of time  Medical Problem List and Plan:  1. 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 Status post revascularization with clot retrieval  2. DVT Prophylaxis/Anticoagulation: SCDs. Monitor for any signs of DVT  3. Pain Management: Tylenol as needed  4. Mood/anxiety: Xanax 0.25 mg twice a day, Ativan 1 mg every 6 as as needed anxiety. Bed alarm for safety  5. Neuropsych: This patient is capable of making decisions on his own behalf.  6. Dysphagia. Dysphagia 2 thin liquids. Followup speech therapy  7.Hypertension. Cozaar 25 mg daily, Lasix 20 mg daily, Aldactone 12.5 mg daily. Monitor with increased mobility  8. Cardiomyopathy/CHF. Monitor for any signs of fluid overload. Followup  cardiology services.  Non ischemic, normal coronaries on cath, will need freq rest breaks 9.  UTI- treated 10.  Nausea: improved 11. Cardiac arrythmia- bigeminy noted on monitor coreg resumed, normal exam today 12.  Post nasal drip, start claritin at hs, add flonase nasal spray LOS (Days) 3 A FACE TO FACE EVALUATION WAS PERFORMED  Charlett Blake 05/27/2013, 7:23 AM

## 2013-05-27 NOTE — Progress Notes (Signed)
Physical Therapy Weekly Progress Note  Patient Details  Name: Isaac Johnson MRN: [id number] Date of Birth: 03/29/1965  Beginning of progress report period: May 20, 2013 End of progress report period: May 27, 2013  Today's Date: 05/27/2013 Time: 3491-7915 Time Calculation (min): 57 min  Patient has met 1 of 4 short term goals. Pt has demonstrated marked improvement in independence with ambulation, as pt performed gait x74' with min A during today's session. Pt progressing toward STG's addressing basic transfers and w/c mobility. Stair negotiation has not been addressed within past week, as sessions have taken place primarily in patient room and brain injury gym to mitigate changes in vital signs associated with pt anxiety.  Patient continues to demonstrate the following deficits: decreased cardiorespiratoy endurance, impaired timing and sequencing, unbalanced muscle activation, decreased visual perceptual skills, decreased attention to right, decreased standing balance, decreased postural control, hemiplegia and decreased balance strategies and therefore will continue to benefit from skilled PT intervention to enhance overall performance with activity tolerance, balance, postural control, ability to compensate for deficits, functional use of  right upper extremity and right lower extremity, attention to R side, and coordination.  Patient progressing toward long term goals..  Continue plan of care.  PT Short Term Goals Week 2:  PT Short Term Goal 1 (Week 2): Pt will perform bed<>chair with close supervision to min guard. PT Short Term Goal 1 - Progress (Week 2): Progressing toward goal PT Short Term Goal 2 (Week 2): Pt will perform gait x50' in controlled environment with +1Total A. PT Short Term Goal 2 - Progress (Week 2): Met PT Short Term Goal 3 (Week 2): Pt will perform w/c mobility x100' in controlled environment with supervision. PT Short Term Goal 3 - Progress (Week 2): Progressing  toward goal PT Short Term Goal 4 (Week 2): Pt will negotiate 3 steps with single hand rail and max A. PT Short Term Goal 4 - Progress (Week 2): Not met Week 3:  PT Short Term Goal 1 (Week 3): Pt will consistently perform bed<>chair transfer with supervision. PT Short Term Goal 2 (Week 3): Pt will ambulate x100' in controlled environment with min A using LRAD. PT Short Term Goal 3 (Week 3): Pt will negotiate 3 steps with single hand rail and max A.  Skilled Therapeutic Interventions/Progress Updates:    Pt received semi-reclined in bed; agreeable to therapy. Session focused on blocked practice of basic transfers, increasing activity tolerance, and paced breathing during ambulation to increase gait stability. Transported pt to brain injury (BI) gym to decrease distractions, stressors in surrounding environment. In BI gym, performed blocked practice of multiple squat pivot transfers. Gait x74' in controlled environment with rolling walker, R hand orthosis, min A, verbal cueing for upright posture, paced breathing during gait, manual steering of walker to L side secondary to pt tendency to veer R.  Post-gait, pt with moderate SOB and the following vitals: HR 89 (no fluctuation), SpO2 99%. Pt performed w/c mobility x75' in controlled environment with min A, tactile cueing at L knee to increase LLE weightbearing. Session ended in pt room, where pt was left semi-reclined in bed with 3 bed rails up, bed alarm on, and all needs within reach.   Therapy Documentation Precautions:  Restrictions Weight Bearing Restrictions: No Pain: Pain Assessment Pain Assessment: No/denies pain Locomotion : Ambulation Ambulation/Gait Assistance: 4: Min assist Wheelchair Mobility Distance: 75   See FIM for current functional status  Therapy/Group: Individual Therapy  Malva Cogan Forrester Blando 05/27/2013, 12:23 PM

## 2013-05-27 NOTE — Progress Notes (Signed)
Speech Language Pathology Weekly Progress and Session Notes  Patient Details  Name: Isaac Johnson MRN: [id number] Date of Birth: 1966/02/03  Beginning of progress report period: May 20, 2013 End of progress report period: May 27, 2013  Today's Date: 05/27/2013 Time: 6269-4854 Time Calculation (min): 45 min  Short Term Goals: Week 2: SLP Short Term Goal 1 (Week 2): Pt. will demonstrate appropriate problem solving abilities during complex activities wtih moderate verbal cues SLP Short Term Goal 1 - Progress (Week 2): Met SLP Short Term Goal 2 (Week 2): Pt. will recall and utilize speech strategies in conversation with supervision verbal cues. SLP Short Term Goal 2 - Progress (Week 2): Progressing toward goal SLP Short Term Goal 3 (Week 2): Pt. will utilize memory strategies to recall important information with min verbal cues SLP Short Term Goal 3 - Progress (Week 2): Met SLP Short Term Goal 4 (Week 2): Pt will demonstrate adequate mastication and oral clearance wtih Dys 3 textures wtih Min cues SLP Short Term Goal 4 - Progress (Week 2): Met    New Short Term Goals: Week 3: SLP Short Term Goal 1 (Week 3): Pt. will demonstrate appropriate problem solving abilities during complex activities wtih supervision level cues SLP Short Term Goal 2 (Week 3): Pt. will recall and utilize speech strategies in conversation with supervision verbal cues. SLP Short Term Goal 3 (Week 3): Pt. will utilize memory strategies to recall important information with supervision level cues  Weekly Progress Updates: Pt has met 3 out of 4 STGs during this reporting period due to improvements with use of speech intelligibility strategies, complex problem solving skills, and recall of information. Pt has demonstrated his ability to orally manipulate Dys 3 textures, however prefers to remain on Dys 2 textures for energy conservation; will continue to encourage increased textures. Education is ongoing. Pt will benefit  from continued SLP services to maximize functional communication and independence as well as swallowing safety prior to discharge home.   Intensity: Minumum of 1-2 x/day, 30 to 90 minutes Frequency: 5 out of 7 days Duration/Length of Stay: 2 weeks Treatment/Interventions: Cognitive remediation/compensation;Patient/family education;Functional tasks;Dysphagia/aspiration precaution training   Daily Session Skilled Therapeutic Interventions: Skilled treatment focused on cognitive and speech goals. SLP facilitated session with Min cues for use of speech intelligibility strategies at the conversational level in a noisy environment. Pt required Min cues for recall of home medication regimen. SLP assisted with generation of list of current active medications. Pt completed in medication label reading task with supervision level cues.      FIM:  Comprehension Comprehension Mode: Auditory Comprehension: 6-Follows complex conversation/direction: With extra time/assistive device Expression Expression Mode: Verbal Expression: 4-Expresses basic 75 - 89% of the time/requires cueing 10 - 24% of the time. Needs helper to occlude trach/needs to repeat words. Social Interaction Social Interaction: 6-Interacts appropriately with others with medication or extra time (anti-anxiety, antidepressant). Problem Solving Problem Solving: 5-Solves complex 90% of the time/cues < 10% of the time Memory Memory: 5-Recognizes or recalls 90% of the time/requires cueing < 10% of the time FIM - Eating Eating Activity: 6: Modified consistency diet: (comment) General    Pain  No/denies pain  Therapy/Group: Individual Therapy   Germain Osgood, M.A. CCC-SLP [phone]  Germain Osgood 05/27/2013, 4:23 PM

## 2013-05-28 ENCOUNTER — Inpatient Hospital Stay (HOSPITAL_COMMUNITY): Payer: BC Managed Care – PPO | Admitting: Physical Therapy

## 2013-05-28 DIAGNOSIS — I5021 Acute systolic (congestive) heart failure: Secondary | ICD-10-CM

## 2013-05-28 DIAGNOSIS — I428 Other cardiomyopathies: Secondary | ICD-10-CM

## 2013-05-28 NOTE — Progress Notes (Signed)
Physical Therapy Session Note  Patient Details  Name: Isaac Johnson MRN: [id number] Date of Birth: Oct 31, 1965  Today's Date: 05/28/2013 Time: 9211-9417 Time Calculation (min): 30 min  Short Term Goals: Week 3:  PT Short Term Goal 1 (Week 3): Pt will consistently perform bed<>chair transfer with supervision. PT Short Term Goal 2 (Week 3): Pt will ambulate x100' in controlled environment with min A using LRAD. PT Short Term Goal 3 (Week 3): Pt will negotiate 3 steps with single hand rail and max A.  Skilled Therapeutic Interventions/Progress Updates:   Pt received semi reclined in bed, agreeable to therapy. Session focused on activity tolerance, gait training, and stair negotiation. Transferred sit <> supine with HOB flat and supervision and squat pivot transfer bed <> w/c with min guard. W/c propulsion 50' using BLEs and LUE and min A. Gait training in controlled environment 100 ft using RW with R hand orthosis and min assist with verbal cues for forward gaze. Negotiated up/down three 5" stairs x 2 using L rail and min-mod assist with therapist blocking posterior to R knee to prevent hyperextension as pt bears weight on RLE; required verbal cues for sequencing, technique, and safety. Pt returned to room, declined sitting up in recliner or w/c because leg rests are not long enough and causes patient LE "sciatica" pain. HR between 83-93 and 02 sats >98% throughout session. Left with all needs within reach and bed alarm on.   Therapy Documentation Precautions:  Restrictions Weight Bearing Restrictions: No Pain: Pain Assessment Pain Assessment: No/denies pain Locomotion : Ambulation Ambulation/Gait Assistance: 4: Min assist Wheelchair Mobility Distance: 50   See FIM for current functional status  Therapy/Group: Individual Therapy  Laretta Alstrom 05/28/2013, 4:26 PM

## 2013-05-28 NOTE — Progress Notes (Signed)
Patient ID: Isaac Johnson, male   DOB: 1965-04-16, 48 y.o.   MRN: [id number]  Subjective/Complaints: 05/28/13.  [name] with history of CHF, stomach ulcers, sciatica LLE, brain lipoma; who was admitted to [facility] on 05/06/13 with right side weakness. He was treated with tPA and transferred to [facility] for further treatment. CT head negative for acute changes. 2D echo with EF 25% with diffuse hypokinesis and grade 2 diastolic dysfunction. Carotid dopplers without significant ICA stenosis. Patient with improvement after tPA . 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  Pt denies CP or SOB,  Past Medical History  Diagnosis Date  . Hypertension   . Stomach ulcer   . Gait instability   . Proteinuria   . GERD (gastroesophageal reflux disease)   . Insomnia   . Dizziness   . Leukopenia   . Sciatica     left  . Brain lipoma 2010    Dr. Trenton Gammon  . CHF (congestive heart failure)   . Acute systolic heart failure   . Cardiomyopathy   . CVA (cerebral infarction)   . Shortness of breath   . Anxiety   . Stroke     History   Social History  . Marital Status: Married    Spouse Name: N/A    Number of Children: N/A  . Years of Education: N/A   Occupational History  . Not on file.   Social History Main Topics  . Smoking status: Never Smoker   . Smokeless tobacco: Never Used  . Alcohol Use: No  . Drug Use: No  . Sexual Activity: Not on file   Other Topics Concern  . Not on file   Social History Narrative  . No narrative on file    Past Surgical History  Procedure Laterality Date  . Radiology with anesthesia N/A 05/07/2013    Procedure: RADIOLOGY WITH ANESTHESIA;  Surgeon: Rob Hickman, MD;  Location: [facility];  Service:  Radiology;  Laterality: N/A;  . Cardiac catheterization  05/23/2013  . Mca stent      Family History  Problem Relation Age of Onset  . Stroke Mother 78  . Gout Father     No Known Allergies  No current facility-administered medications on file prior to encounter.   Current Outpatient Prescriptions on File Prior to Encounter  Medication Sig Dispense Refill  . aspirin EC 81 MG EC tablet Take 1 tablet (81 mg total) by mouth daily.      . carvedilol (COREG) 3.125 MG tablet Take 3.125 mg by mouth 2 (two) times daily with a meal.      . clopidogrel (PLAVIX) 75 MG tablet Take 1 tablet (75 mg total) by mouth daily with breakfast.      . furosemide (LASIX) 40 MG tablet Take 1 tablet (40 mg total) by mouth daily.  30 tablet    . losartan (COZAAR) 50 MG tablet Take 50 mg by mouth daily.      [name] omeprazole (PRILOSEC) 20 MG capsule Take 20 mg by mouth daily.      . potassium chloride SA (K-DUR,KLOR-CON) 20 MEQ tablet Take 0.5 tablets (10 mEq total) by mouth daily.      [name] spironolactone (ALDACTONE) 12.5 mg TABS tablet Take 0.5 tablets (12.5 mg total) by  mouth daily.      . sucralfate (CARAFATE) 1 GM/10ML suspension Take 10 mLs (1 g total) by mouth 2 (two) times daily before a meal.  420 mL  0  . traMADol (ULTRAM) 50 MG tablet Take 1 tablet (50 mg total) by mouth every 6 (six) hours as needed for moderate pain.  30 tablet    . zolpidem (AMBIEN) 5 MG tablet Take 1 tablet (5 mg total) by mouth at bedtime as needed for sleep.  30 tablet  0    BP 105/68  Pulse 72  Temp(Src) 97.4 F (36.3 C) (Oral)  Resp 16  SpO2 100%  Patient Vitals for the past 24 hrs:  BP Temp Temp src Pulse Resp SpO2  05/28/13 0634 105/68 mmHg 97.4 F (36.3 C) Oral 72 16 100 %  05/27/13 2107 111/80 mmHg 98.2 F (36.8 C) Oral 71 16 99 %  05/27/13 1510 119/79 mmHg 97.5 F (36.4 C) Oral 86 18 100 %     Intake/Output Summary (Last 24 hours) at 05/28/13 0830 Last data filed at 05/28/13 0630  Gross per 24 hour  Intake     342 ml  Output    450 ml  Net   -108 ml    Objective: Vital Signs: Blood pressure 105/68, pulse 72, temperature 97.4 F (36.3 C), temperature source Oral, resp. rate 16, SpO2 100.00%. No results found. No results found for this or any previous visit (from the past 72 hour(s)).   HEENT: normal Cardio: RRR and no murmur Resp: CTA B/L and unlabored GI: BS positive and NT.ND Extremity:  Pulses positive and No Edema Skin:   Intact Musc/Skel:  Normal Right hemiparesis UE>LE. Mild sensory deficits on right side. Pt with right central 7  Speech dysarthric.   Assessment/Plan: 1. Functional deficits secondary to L MCA distribution infarct tolerating rehab therapies Requires CIR level for functional and medical needs Physiatrist and Rehab RN  to provide 24/7 care with 3hr per day therapies 2.Hypertension. Cozaar 25 mg daily, Lasix 20 mg daily, Aldactone 12.5 mg daily. Monitor with increased mobility  3. Cardiomyopathy/CHF. Monitor for any signs of fluid overload. Followup cardiology services.  Non ischemic, normal coronaries on cath.   LOS (Days) 4 A FACE TO FACE EVALUATION WAS PERFORMED  Marletta Lor 05/28/2013, 8:28 AM

## 2013-05-29 ENCOUNTER — Inpatient Hospital Stay (HOSPITAL_COMMUNITY): Payer: BC Managed Care – PPO | Admitting: *Deleted

## 2013-05-29 DIAGNOSIS — I509 Heart failure, unspecified: Secondary | ICD-10-CM

## 2013-05-29 NOTE — Progress Notes (Signed)
Physical Therapy Session Note  Patient Details  Name: Isaac Johnson MRN: [id number] Date of Birth: 10-14-1965  Today's Date: 05/29/2013 Time: 1425-1510 Time Calculation (min): 45 min   Skilled Therapeutic Interventions/Progress Updates:  Patient resting in bed at the beginning of the session,agrees to therapy intervention, transfer to w/c with min A. NuStep with R hand AD applied 2x 3  Min on level 4 to increase strength and facilitate reciprocal movement pattern.  Step ups on 4 inch step 2 x 10 with min A. Sit to stand with pushing up from arm rests x 7 , sit to stand =>half stand=>sit with manual facilitation of weight shifting to R LE , no UE support 1 x 9. Squats to facilitate weight bearing and R quad activation 2 x 15. Gait training 2 x 60 feet with mod A and manual assistance to prevent R Knee Hyperextension. Patient uses RW with R hand splint. Patient returned to room , back to bed ,alarm on and all needs within reach.  Patient needed increased verbal cues to increase appropriate breathing pattern and to decrease velocity of gait. No s/o of pain during this session.  Therapy Documentation Precautions:  Restrictions Weight Bearing Restrictions: No Vital Signs: Therapy Vitals Temp: 98.1 F (36.7 C) Temp src: Oral Pulse Rate: 74 Resp: 19 BP: 94/60 mmHg Patient Position, if appropriate: Lying Oxygen Therapy SpO2: 98 % O2 Device: None (Room air)   See FIM for current functional status  Therapy/Group: Individual Therapy  Guadlupe Spanish 05/29/2013, 3:25 PM

## 2013-05-29 NOTE — Progress Notes (Signed)
Patient ID: Isaac Johnson, male   DOB: 11-Apr-1965, 48 y.o.   MRN: [id number]   Patient ID: Isaac Johnson, male   DOB: [birth year], 48 y.o.   MRN: [id number]  Subjective/Complaints:  05/29/13.  [name] with history of CHF, stomach ulcers, sciatica LLE, brain lipoma; who was admitted to [facility] on 05/06/13 with right side weakness. He was treated with tPA and transferred to [facility] for further treatment. CT head negative for acute changes. 2D echo with EF 25% with diffuse hypokinesis and grade 2 diastolic dysfunction. Carotid dopplers without significant ICA stenosis. Patient with improvement after tPA . 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  Pt denies CP or SOB.  More alert and conversant today; no c/os  Past Medical History  Diagnosis Date  . Hypertension   . Stomach ulcer   . Gait instability   . Proteinuria   . GERD (gastroesophageal reflux disease)   . Insomnia   . Dizziness   . Leukopenia   . Sciatica     left  . Brain lipoma 2010    Dr. Trenton Gammon  . CHF (congestive heart failure)   . Acute systolic heart failure   . Cardiomyopathy   . CVA (cerebral infarction)   . Shortness of breath   . Anxiety   . Stroke     History   Social History  . Marital Status: Married    Spouse Name: N/A    Number of Children: N/A  . Years of Education: N/A   Occupational History  . Not on file.   Social History Main Topics  . Smoking status: Never Smoker   . Smokeless tobacco: Never Used  . Alcohol Use: No  . Drug Use: No  . Sexual Activity: Not on file   Other Topics Concern  . Not on file   Social History Narrative  . No narrative on file    Past Surgical History  Procedure Laterality Date  . Radiology with anesthesia  N/A 05/07/2013    Procedure: RADIOLOGY WITH ANESTHESIA;  Surgeon: Rob Hickman, MD;  Location: [place];  Service: Radiology;  Laterality: N/A;  . Cardiac catheterization  05/23/2013  . Mca stent      Family History  Problem Relation Age of Onset  . Stroke Mother 58  . Gout Father     No Known Allergies  No current facility-administered medications on file prior to encounter.   Current Outpatient Prescriptions on File Prior to Encounter  Medication Sig Dispense Refill  . aspirin EC 81 MG EC tablet Take 1 tablet (81 mg total) by mouth daily.      . carvedilol (COREG) 3.125 MG tablet Take 3.125 mg by mouth 2 (two) times daily with a meal.      . clopidogrel (PLAVIX) 75 MG tablet Take 1 tablet (75 mg total) by mouth daily with breakfast.      . furosemide (LASIX) 40 MG tablet Take 1 tablet (40 mg total) by mouth daily.  30 tablet    . losartan (COZAAR) 50 MG tablet Take 50 mg by mouth daily.      [name] omeprazole (PRILOSEC) 20 MG capsule Take 20 mg by mouth daily.      . potassium chloride SA (K-DUR,KLOR-CON) 20 MEQ tablet Take  0.5 tablets (10 mEq total) by mouth daily.      [name] spironolactone (ALDACTONE) 12.5 mg TABS tablet Take 0.5 tablets (12.5 mg total) by mouth daily.      . sucralfate (CARAFATE) 1 GM/10ML suspension Take 10 mLs (1 g total) by mouth 2 (two) times daily before a meal.  420 mL  0  . traMADol (ULTRAM) 50 MG tablet Take 1 tablet (50 mg total) by mouth every 6 (six) hours as needed for moderate pain.  30 tablet    . zolpidem (AMBIEN) 5 MG tablet Take 1 tablet (5 mg total) by mouth at bedtime as needed for sleep.  30 tablet  0    BP 119/70  Pulse 69  Temp(Src) 97.3 F (36.3 C) (Oral)  Resp 15  SpO2 100%  Patient Vitals for the past 24 hrs:  BP Temp Temp src Pulse Resp SpO2  05/29/13 0515 119/70 mmHg 97.3 F (36.3 C) Oral 69 15 100 %  05/28/13 2128 133/99 mmHg 97.3 F (36.3 C) Oral 45 16 100 %  05/28/13 1609 118/85 mmHg 98.7 F (37.1 C) Oral 74 16 98 %      Intake/Output Summary (Last 24 hours) at 05/29/13 0815 Last data filed at 05/29/13 0932  Gross per 24 hour  Intake    840 ml  Output    950 ml  Net   -110 ml    Objective: Vital Signs: Blood pressure 119/70, pulse 69, temperature 97.3 F (36.3 C), temperature source Oral, resp. rate 15, SpO2 100.00%. No results found. No results found for this or any previous visit (from the past 72 hour(s)).   HEENT: normal Cardio: RRR and no murmur Resp: CTA B/L and unlabored GI: BS positive and NT.ND Extremity:  Pulses positive and No Edema Skin:   Intact Musc/Skel:  Normal Right hemiparesis UE>LE. Mild sensory deficits on right side. Pt with right central 7  Speech improved   Assessment/Plan: 1. Functional deficits secondary to L MCA distribution infarct tolerating rehab therapies Requires CIR level for functional and medical needs Physiatrist and Rehab RN  to provide 24/7 care with 3hr per day therapies 2.Hypertension. Cozaar 25 mg daily, Lasix 20 mg daily, Aldactone 12.5 mg daily. Monitor with increased mobility  3. Cardiomyopathy/CHF. Monitor for any signs of fluid overload. Followup cardiology services.  Non ischemic, normal coronaries on cath.   LOS (Days) 5 A FACE TO FACE EVALUATION WAS PERFORMED  Marletta Lor 05/29/2013, 8:15 AM

## 2013-05-30 ENCOUNTER — Inpatient Hospital Stay (HOSPITAL_COMMUNITY): Payer: BC Managed Care – PPO | Admitting: Physical Therapy

## 2013-05-30 ENCOUNTER — Inpatient Hospital Stay (HOSPITAL_COMMUNITY): Payer: BC Managed Care – PPO

## 2013-05-30 ENCOUNTER — Inpatient Hospital Stay (HOSPITAL_COMMUNITY): Payer: BC Managed Care – PPO | Admitting: Speech Pathology

## 2013-05-30 DIAGNOSIS — I6350 Cerebral infarction due to unspecified occlusion or stenosis of unspecified cerebral artery: Secondary | ICD-10-CM

## 2013-05-30 DIAGNOSIS — I69991 Dysphagia following unspecified cerebrovascular disease: Secondary | ICD-10-CM

## 2013-05-30 DIAGNOSIS — I69922 Dysarthria following unspecified cerebrovascular disease: Secondary | ICD-10-CM

## 2013-05-30 DIAGNOSIS — I69959 Hemiplegia and hemiparesis following unspecified cerebrovascular disease affecting unspecified side: Secondary | ICD-10-CM

## 2013-05-30 NOTE — Progress Notes (Signed)
Subjective/Complaints: No new issues over weekend, occ cough  Review of Systems - Negative except cough  Objective: Vital Signs: Blood pressure 113/72, pulse 76, temperature 97.7 F (36.5 C), temperature source Oral, resp. rate 18, SpO2 100.00%. No results found. No results found for this or any previous visit (from the past 72 hour(s)).   HEENT: normal Cardio: RRR and no murmur Resp: CTA B/L and unlabored GI: BS positive and unlabored Extremity:  Pulses positive and No Edema Skin:   Intact Neuro: Alert/Oriented, Cranial Nerve II-XII normal, Abnormal Motor 3- RUE,and RLE except 2- Left ankle, Tone:  Hypertonia and Dysarthric Musc/Skel:  Normal Gen NAD   Assessment/Plan: 1. Functional deficits secondary to right hemiparesis Left embolic MCA infarct which require 3+ hours per day of interdisciplinary therapy in a comprehensive inpatient rehab setting. Physiatrist is providing close team supervision and 24 hour management of active medical problems listed below. Physiatrist and rehab team continue to assess barriers to discharge/monitor patient progress toward functional and medical goals. FIM: FIM - Bathing Bathing Steps Patient Completed: Chest;Abdomen;Right upper leg;Left upper leg;Right Arm;Right lower leg (including foot);Left lower leg (including foot);Buttocks;Front perineal area Bathing: 4: Min-Patient completes 8-9 68f 10 parts or 75+ percent  FIM - Upper Body Dressing/Undressing Upper body dressing/undressing steps patient completed: Thread/unthread left sleeve of pullover shirt/dress;Pull shirt over trunk;Put head through opening of pull over shirt/dress;Thread/unthread right sleeve of pullover shirt/dresss Upper body dressing/undressing: 5: Set-up assist to: Obtain clothing/put away FIM - Lower Body Dressing/Undressing Lower body dressing/undressing steps patient completed: Pull underwear up/down;Pull pants up/down;Thread/unthread left underwear leg;Thread/unthread  left pants leg;Don/Doff right sock;Don/Doff left sock;Thread/unthread right underwear leg Lower body dressing/undressing: 4: Min-Patient completed 75 plus % of tasks  FIM - Toileting Toileting steps completed by patient: Performs perineal hygiene Toileting Assistive Devices: Grab bar or rail for support Toileting: 2: Max-Patient completed 1 of 3 steps  FIM - Radio producer Devices: Grab bars Toilet Transfers: 3-To toilet/BSC: Mod A (lift or lower assist);3-From toilet/BSC: Mod A (lift or lower assist)  FIM - Bed/Chair Transfer Bed/Chair Transfer Assistive Devices: Arm rests Bed/Chair Transfer: 4: Bed > Chair or W/C: Min A (steadying Pt. > 75%);5: Sit > Supine: Supervision (verbal cues/safety issues);5: Supine > Sit: Supervision (verbal cues/safety issues)  FIM - Locomotion: Wheelchair Distance: 50 Locomotion: Wheelchair: 2: Travels 50 - 149 ft with supervision, cueing or coaxing FIM - Locomotion: Ambulation Locomotion: Ambulation Assistive Devices: Walker - Rolling;Orthosis Ambulation/Gait Assistance: 4: Min assist Locomotion: Ambulation: 2: Travels 50 - 149 ft with minimal assistance (Pt.>75%)  Comprehension Comprehension Mode: Auditory Comprehension: 5-Follows basic conversation/direction: With no assist  Expression Expression Mode: Verbal Expression: 5-Expresses basic needs/ideas: With no assist  Social Interaction Social Interaction: 6-Interacts appropriately with others with medication or extra time (anti-anxiety, antidepressant).  Problem Solving Problem Solving: 5-Solves basic problems: With no assist  Memory Memory: 6-More than reasonable amt of time  Medical Problem List and Plan:  1. 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 Status post revascularization with clot retrieval  2. DVT Prophylaxis/Anticoagulation: SCDs. Monitor for any signs of DVT  3. Pain Management: Tylenol as needed  4.  Mood/anxiety: Xanax 0.25 mg twice a day, Ativan 1 mg every 6 as as needed anxiety. Bed alarm for safety  5. Neuropsych: This patient is capable of making decisions on his own behalf.  6. Dysphagia. Dysphagia 2 thin liquids. Followup speech therapy  7.Hypertension. Cozaar 25 mg daily, Lasix 20 mg daily, Aldactone 12.5 mg  daily. Monitor with increased mobility  8. Cardiomyopathy/CHF. Monitor for any signs of fluid overload. Followup cardiology services. Plan left heart catheterization as outpatient per cardiology Dr. Aundra Dubin    LOS (Days) 6 A FACE TO FACE EVALUATION WAS PERFORMED  Charlett Blake 05/30/2013, 8:33 AM

## 2013-05-30 NOTE — Progress Notes (Signed)
Occupational Therapy Session Note  Patient Details  Name: Isaac Johnson MRN: [id number] Date of Birth: 03/15/1965  Today's Date: 05/30/2013 Time: 1030-1130 Time Calculation (min): 60 min  Short Term Goals: Week 2:  OT Short Term Goal 1 (Week 2): RUE:  Patient will position and monitor RUE at all times during BADL and functional mobility with no more than 4 vcs per session OT Short Term Goal 2 (Week 2): Patient will complete 3/3 toilet tasks with steadying assist only OT Short Term Goal 3 (Week 2): Patient will complete LB dressing with min assist  OT Short Term Goal 4 (Week 2): Patient will initiate use of RUE as stabilizer during self-care task with no cues  Skilled Therapeutic Interventions/Progress Updates:    Pt seen for ADL retraining with focus on functional transfers, activity tolerance, R NMR. Pt received sitting in w/c requesting to complete bathing at sink. Pt with improved AROM at RUE to wash approx 80% of LUE, requiring HOH assist for thoroughness. Completed sit<>stand throughout bathing and dressing at min assist with min cues for hand placement. Pt required no cues for hemi dressing technique for first time. Provided shoe buttons to increase independence with LB dressing. Practiced tub transfer with TTB, with pt completed at min assist requiring assist to bring RLE into/out of tub. Pt returned to room and ambulated approx 8 feet with min assist using RW to bed. Pt left supine in bed with all needs in reach. Pt required frequent rest breaks throughout therapy session this AM. SpO2>96% throughout and HR remaining therapeutic.   Therapy Documentation Precautions:  Restrictions Weight Bearing Restrictions: No General:   Vital Signs:   Pain: Pain Assessment Pain Assessment: No/denies pain Pain Score: 0-No pain  See FIM for current functional status  Therapy/Group: Individual Therapy  Zenobia Kuennen N Palma Buster 05/30/2013, 12:17 PM

## 2013-05-30 NOTE — Progress Notes (Signed)
Speech Language Pathology Daily Session Note  Patient Details  Name: RONNY KORFF MRN: [id number] Date of Birth: 1965/03/10  Today's Date: 05/30/2013 Time: 1330-1410 Time Calculation (min): 40 min  Short Term Goals: Week 3: SLP Short Term Goal 1 (Week 3): Pt. will demonstrate appropriate problem solving abilities during complex activities wtih supervision level cues SLP Short Term Goal 2 (Week 3): Pt. will recall and utilize speech strategies in conversation with supervision verbal cues. SLP Short Term Goal 3 (Week 3): Pt. will utilize memory strategies to recall important information with supervision level cues  Skilled Therapeutic Interventions: Skilled treatment session focused on cognitive goals. SLP facilitated session by providing Min A verbal and question cues for functional problem solving and organization with a mildly complex, new learning task.  Pt also required Min verbal cues for utilization of increased vocal intensity to increase intelligibility at the sentence level. Continue with current plan of care.    FIM:  Comprehension Comprehension Mode: Auditory Comprehension: 5-Understands complex 90% of the time/Cues < 10% of the time Expression Expression Mode: Verbal Expression: 4-Expresses basic 75 - 89% of the time/requires cueing 10 - 24% of the time. Needs helper to occlude trach/needs to repeat words. Social Interaction Social Interaction: 6-Interacts appropriately with others with medication or extra time (anti-anxiety, antidepressant). Problem Solving Problem Solving: 5-Solves complex 90% of the time/cues < 10% of the time Memory Memory: 5-Requires cues to use assistive device  Pain Pain Assessment Pain Assessment: No/denies pain  Therapy/Group: Individual Therapy  Buzzy Han 05/30/2013, 2:14 PM

## 2013-05-30 NOTE — Progress Notes (Addendum)
Physical Therapy Session Note  Patient Details  Name: Isaac Johnson MRN: [id number] Date of Birth: May 19, 1965  Today's Date: 05/30/2013 Time: 4854-6270 and 3500-9381 Time Calculation (min): 60 min and 38 min  Short Term Goals: Week 3:  PT Short Term Goal 1 (Week 3): Pt will consistently perform bed<>chair transfer with supervision. PT Short Term Goal 2 (Week 3): Pt will ambulate x100' in controlled environment with min A using LRAD. PT Short Term Goal 3 (Week 3): Pt will negotiate 3 steps with single hand rail and max A.  Skilled Therapeutic Interventions/Progress Updates:    Treatment Session 1: Pt received semi-reclined in bed; agreeable to therapy. Session focused on increasing pt stability/independence with gait. Placed 3/8" heel lift in R shoe to decrease R genu recurvatum; however, R genu recurvatum persisted during stand pivot transfer from bed>w/c with rolling walker, R hand orthosis. Therefore, R AFO (Blue Rocker) donned for dynamic stability of R knee during gait. No R genu recurvatum noted during remaining gait trials. Performed gait x70', x110' in controlled environment with rolling walker, R hand orthosis, manual cueing at R axilla/anterior shoulder to promote upright posture. Manual facilitation of weight shift to L side during ~25% of gait trial to facilitate RLE clearance during swing phase. Max verbal cueing for upright posture. See below for detailed description of LiteGait interventions.   W/c mobility x75' in controlled environment with bilat LE's, supervision/cueing for R-sided attention. Session ended in pt room, where pt was left semi-reclined in bed (pt declined w/c and bedside chair due to LLE discomfort with prolonged sitting) with 3 bed rails up, bed alarm on, and all needs within reach. Vital signs WNL throughout session, with exception of SpO2 89% following initial gait trial. SpO2 increased within 10 seconds of rest break. RN notified of session.  Treatment Session 2:  Pt received semi-reclined in bed; agreeable to therapy. Session focused on increasing stability/independence with gait and stair negotiation, initiating hands-on family training.  Donned R AFO (Blue Rocker) with heel lift. Pt performed gait x130', x70' (seated rest breaks between trials) in controlled environment with min guard using rolling walker, R hand orthosis, verbal cueing to decrease gait speed to control RLE movement/placement during swing phase. Multiple sit<>stand transfers from w/c, standard chair, and mat table with rolling walker and close supervision to min A.  Wife present for final 20 minutes of session. Therapist explained, demonstrated appropriate assist/cueing with the following: negotiation of 3 stairs with L rail, forward-facing with step-to pattern and min A, verbal cueing for technique, sequencing; gait x50' in controlled environment with rolling walker, R hand orthosis, min A, verbal cueing to decrease gait speed; stand pivot from w/c<>bed with min A. Wife provided safe/appropriate assist and cueing with basic transfers. Will need reinforcement with other described aspects of functional mobility. SpO2 and HR WNL throughout. RN notified of session, wife safety assisting with basic transfers.   Addendum: Updated long term goals addressing the following secondary to pt progressing quickly: gait in controlled/community environments, w/c mobility in controlled environment. Modified stair negotiation goal secondary to updated report of home entrance (3 steps with 2 rails as opposed to 3 steps without rails).  Therapy Documentation Precautions:  Restrictions Weight Bearing Restrictions: No Pain: Pain Assessment Pain Assessment: No/denies pain Pain Score: 0-No pain Locomotion : Ambulation Ambulation/Gait Assistance: 4: Min assist Wheelchair Mobility Distance: 75 Gait Training:   The following gait training interventions were performed using LiteGait over treadmill: gait x7  minutes total with harness biased to decrease  RLE weightbearing. Focus on weight shifting to L side, RLE clearance during RLE swing phase of gait. Treadmill speed: .2-.3. Unable to assess within-session improvement in gait speed, quality secondary to pt fatigue following LiteGait interventions.   See FIM for current functional status  Therapy/Group: Individual Therapy  Malva Cogan Hobble 05/30/2013, 12:25 PM

## 2013-05-30 NOTE — IPOC Note (Signed)
See prior IPOC for details (new IPOC not required as this was NOT a readmission)

## 2013-05-31 ENCOUNTER — Inpatient Hospital Stay (HOSPITAL_COMMUNITY): Payer: BC Managed Care – PPO | Admitting: Physical Therapy

## 2013-05-31 ENCOUNTER — Inpatient Hospital Stay (HOSPITAL_COMMUNITY): Payer: BC Managed Care – PPO | Admitting: Speech Pathology

## 2013-05-31 ENCOUNTER — Encounter (HOSPITAL_COMMUNITY): Payer: BC Managed Care – PPO | Admitting: Occupational Therapy

## 2013-05-31 NOTE — Progress Notes (Signed)
Patient noted bleeding from right lower abdomen . Patient remains on heparin subcutaneous. Pressure dressing applied and bandaid applied . Continue with plan of care .                     Mliss Sax

## 2013-05-31 NOTE — Progress Notes (Signed)
Occupational Therapy Session Note  Patient Details  Name: Isaac Johnson MRN: [id number] Date of Birth: October 05, 1965  Today's Date: 05/31/2013 Time: 1005-1105 Time Calculation (min): 60 min    Skilled Therapeutic Interventions/Progress Updates:    Patient seen this am for ADL retraining with emphasis on functional use of right upper and lower extremity.  Patient encouraged to utilize both arms and legs when lifting off and lowering back down to surfaces.  Patient with active movement present throughout his right upper extremity at shoulder, elbow, forearm, wrist and digits.  Patient lacks strength, and sustained muscle activation to effectively use hand for functional  tasks such as brushing his teeth.  Patient has difficulty controlling more than one joint at a time as needed for function.  Patiwent very encouraged by right upper extremity movement.    Therapy Documentation Precautions:  Restrictions Weight Bearing Restrictions: No   Pain:  Mild pain in right wrist with extended arm weight bearing. Relieved with positional change. See FIM for current functional status  Therapy/Group: Individual Therapy  Mariah Milling 05/31/2013, 3:32 PM

## 2013-05-31 NOTE — Progress Notes (Addendum)
Physical Therapy Session Note  Patient Details  Name: Isaac Johnson MRN: [id number] Date of Birth: 05-16-1965  Today's Date: 05/31/2013 Time: 0800-0900 and 1418-1500 Time Calculation (min): 60 min and 42 min  Short Term Goals: Week 3:  PT Short Term Goal 1 (Week 3): Pt will consistently perform bed<>chair transfer with supervision. PT Short Term Goal 2 (Week 3): Pt will ambulate x100' in controlled environment with min A using LRAD. PT Short Term Goal 3 (Week 3): Pt will negotiate 3 steps with single hand rail and max A.  Skilled Therapeutic Interventions/Progress Updates:    Treatment Session 1: Pt received semi-reclined in bed; agreeable to therapy. Session focused on gait training, RLE strengthening/NMR to prevent R genu recurvatum during midstance of gait. Pt inconsistently demonstrates anterior trunk lean during loading response vs midstance; therefore, difficult to discern origin of R genu recurvatum. Session focused on addressing quadriceps control, proprioception, and hip extension during gait. Pt performed gait x130' in controlled environment with rolling walker, R hand orthosis, R AFO (Blue Rocker) requiring min guard. Propelled w/c with RLE only x50' with tactile cueing at R knee for proprioceptive input.   Began session in treatment gym; however, pt with labored breathing as gym became increasingly more crowded with other patients. SpO2 99%, HR 89 and regular. Pt reports feeling anxious when asked. SOB persisted; therefore, transported pt in w/c to brain injury gym to decrease anxiety. In BI gym, labored breathing and coughing limiting pt participation. Resting HR 46 SpO2 83%, but increased to 95% within 20 seconds. See below for detailed description of therapeutic exercises performed (to pt tolerance) in BI gym. Session ended in pt room, where pt was left seated in w/c with RN present and all needs within reach. RN notified of session.  Treatment Session 2: Pt received semi-reclined in  bed; agreeable to therapy. Session focused on gait training, initiation use of small based quad cane [facility]), utilization of bilat UE's during functional mobility.  See below for detailed description of gait training. W/c mobility x75' in controlled environment with bilat LE's, supervision, tactile cueing at R knee to increase RLE weight bearing. Donned R AFO, R heel lift to control R genu recurvatum. Negotiated 3 steps with bilat rails (to simulate primary entrance of home) with step-to pattern, verbal cueing for sequencing. Gait x30' in controlled environment with [facility] required min A, verbal cueing for upright posture, tactile cueing for gluteus maximus activation. Session ended in pt room, where pt was left semi-reclined in bed with 3 bed rails up, bed alarm on, and all needs within reach. Pt not limited by anxiety during this session. Vitals WNL and pt with no episodes of labored breathing, coughing, or SOB.  Therapy Documentation Precautions:  Restrictions Weight Bearing Restrictions: No Vital Signs: Therapy Vitals Temp: 97.8 F (36.6 C) Temp src: Oral Pulse Rate: 70 Resp: 18 BP: 120/68 mmHg Patient Position, if appropriate: Sitting Oxygen Therapy SpO2: 94 % O2 Device: None (Room air) Pain: Pain Assessment Pain Assessment: No/denies pain Therapeutic Exercises: Standing with bilat UE support at rolling walker, pt performed 1/4 squats with tactile cueing at R distal quadriceps to promote control of end range knee extension to prevent hyperextension.  Gait Training: Explained, demonstrated sequencing using SBQC. Gait x40' in controlled environment with [facility] requiring min A, HOH assist at QC, verbal cueing for sequencing with 3-point gait pattern, then transition to cueing for 2-point gait pattern. Pt required min manual facilitation of L weight shifting to promote RLE clearance during swing  phase, verbal/tactile cueing at R knee to increase pt awareness of R genu recurvatum (onset during  final 10' of gait trial).   With bilat UE support at parallel bars: performed short-distance gait trials with multimodal cueing focused on R gluteus maximus activation during RLE stance phase to increase R hip extension, decrease R genu recurvatum. Pt able to effectively control R genu recurvatum without cueing during final short-distance trial.  See FIM for current functional status  Therapy/Group: Individual Therapy  Malva Cogan Hobble 05/31/2013, 7:16 PM

## 2013-05-31 NOTE — Progress Notes (Signed)
Subjective/Complaints: No new issues over weekend, occ cough Appears more calm Review of Systems - Negative except cough  Objective: Vital Signs: Blood pressure 112/79, pulse 68, temperature 98.2 F (36.8 C), temperature source Oral, resp. rate 18, SpO2 100.00%. No results found. No results found for this or any previous visit (from the past 72 hour(s)).   HEENT: normal Cardio: RRR and no murmur Resp: CTA B/L and unlabored GI: BS positive and unlabored Extremity:  Pulses positive and No Edema Skin:   Intact Neuro: Alert/Oriented, Cranial Nerve II-XII normal, Abnormal Motor 3- RUE,and RLE except 2- Left ankle, Tone:  Hypertonia and Dysarthric Musc/Skel:  Normal Gen NAD   Assessment/Plan: 1. Functional deficits secondary to right hemiparesis Left embolic MCA infarct which require 3+ hours per day of interdisciplinary therapy in a comprehensive inpatient rehab setting. Physiatrist is providing close team supervision and 24 hour management of active medical problems listed below. Physiatrist and rehab team continue to assess barriers to discharge/monitor patient progress toward functional and medical goals. FIM: FIM - Bathing Bathing Steps Patient Completed: Chest;Abdomen;Right upper leg;Left upper leg;Right Arm;Right lower leg (including foot);Left lower leg (including foot);Buttocks;Front perineal area Bathing: 4: Min-Patient completes 8-9 17f 10 parts or 75+ percent  FIM - Upper Body Dressing/Undressing Upper body dressing/undressing steps patient completed: Thread/unthread left sleeve of pullover shirt/dress;Pull shirt over trunk;Put head through opening of pull over shirt/dress;Thread/unthread right sleeve of pullover shirt/dresss Upper body dressing/undressing: 5: Set-up assist to: Obtain clothing/put away FIM - Lower Body Dressing/Undressing Lower body dressing/undressing steps patient completed: Pull underwear up/down;Pull pants up/down;Thread/unthread left underwear  leg;Thread/unthread left pants leg;Don/Doff right sock;Don/Doff left sock;Thread/unthread right underwear leg;Fasten/unfasten right shoe;Don/Doff left shoe;Don/Doff right shoe;Fasten/unfasten left shoe Lower body dressing/undressing: 4: Min-Patient completed 75 plus % of tasks  FIM - Toileting Toileting steps completed by patient: Performs perineal hygiene Toileting Assistive Devices: Grab bar or rail for support Toileting: 2: Max-Patient completed 1 of 3 steps  FIM - Radio producer Devices: Grab bars Toilet Transfers: 3-To toilet/BSC: Mod A (lift or lower assist);3-From toilet/BSC: Mod A (lift or lower assist)  FIM - Bed/Chair Transfer Bed/Chair Transfer Assistive Devices: Arm rests;Walker;Orthosis (R hand orthosis, R AFO) Bed/Chair Transfer: 4: Chair or W/C > Bed: Min A (steadying Pt. > 75%);5: Sit > Supine: Supervision (verbal cues/safety issues);5: Supine > Sit: Supervision (verbal cues/safety issues);4: Bed > Chair or W/C: Min A (steadying Pt. > 75%)  FIM - Locomotion: Wheelchair Distance: 75 Locomotion: Wheelchair: 0: Activity did not occur FIM - Locomotion: Ambulation Locomotion: Ambulation Assistive Devices: Walker - Rolling;Orthosis (R AFO, R hand orthosis) Ambulation/Gait Assistance: 4: Min guard Locomotion: Ambulation: 2: Travels 50 - 149 ft with minimal assistance (Pt.>75%)  Comprehension Comprehension Mode: Auditory Comprehension: 5-Understands complex 90% of the time/Cues < 10% of the time  Expression Expression Mode: Verbal Expression: 4-Expresses basic 75 - 89% of the time/requires cueing 10 - 24% of the time. Needs helper to occlude trach/needs to repeat words.  Social Interaction Social Interaction: 6-Interacts appropriately with others with medication or extra time (anti-anxiety, antidepressant).  Problem Solving Problem Solving: 5-Solves complex 90% of the time/cues < 10% of the time  Memory Memory: 5-Requires cues to use  assistive device  Medical Problem List and Plan:  1. 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 Status post revascularization with clot retrieval  2. DVT Prophylaxis/Anticoagulation: SCDs. Monitor for any signs of DVT  3. Pain Management: Tylenol as needed  4. Mood/anxiety: Xanax 0.5 mg tid  prn,  Bed alarm for safety  5. Neuropsych: This patient is capable of making decisions on his own behalf.  6. Dysphagia. Dysphagia 2 thin liquids. Followup speech therapy  7.Hypertension. Cozaar 25 mg daily, Lasix 20 mg daily, Aldactone 12.5 mg daily. Monitor with increased mobility  8. Cardiomyopathy/CHF. Monitor for any signs of fluid overload. Followup cardiology services. Plan left heart catheterization as outpatient per cardiology Dr. Aundra Dubin    LOS (Days) [place] E Kirsteins 05/31/2013, 7:42 AM

## 2013-05-31 NOTE — Progress Notes (Signed)
Speech Language Pathology Daily Session Note  Patient Details  Name: Isaac Johnson MRN: [id number] Date of Birth: 1965-09-25  Today's Date: 05/31/2013 Time: 1530-1600 Time Calculation (min): 30 min  Short Term Goals: Week 3: SLP Short Term Goal 1 (Week 3): Pt. will demonstrate appropriate problem solving abilities during complex activities wtih supervision level cues SLP Short Term Goal 2 (Week 3): Pt. will recall and utilize speech strategies in conversation with supervision verbal cues. SLP Short Term Goal 3 (Week 3): Pt. will utilize memory strategies to recall important information with supervision level cues  Skilled Therapeutic Interventions: Skilled treatment session focused on addressing cognitive goals. SLP facilitated session by providing Min assist verbal and question cues for functional problem solving related to set-up of activity and Supervision assist verbal cues to self-monitor and correct errors.  Patient also required Min verbal cues for utilization of increased vocal intensity to increase intelligibility at the sentence level with background noise present. Continue with current plan of care.   FIM:  Comprehension Comprehension Mode: Auditory Comprehension: 5-Understands complex 90% of the time/Cues < 10% of the time Expression Expression Mode: Verbal Expression: 4-Expresses basic 75 - 89% of the time/requires cueing 10 - 24% of the time. Needs helper to occlude trach/needs to repeat words. Social Interaction Social Interaction: 6-Interacts appropriately with others with medication or extra time (anti-anxiety, antidepressant). Problem Solving Problem Solving: 5-Solves complex 90% of the time/cues < 10% of the time Memory Memory: 5-Requires cues to use assistive device  Pain Pain Assessment Pain Assessment: No/denies pain  Therapy/Group: Individual Therapy  Isaac Johnson., Isaac Johnson  Isaac Johnson 05/31/2013, 4:09 PM

## 2013-05-31 NOTE — Progress Notes (Deleted)
Occupational Therapy Session Note  Patient Details  Name: Isaac Johnson MRN: [id number] Date of Birth: March 04, 1965  Today's Date: 05/31/2013 Time: 1005-1105 Time Calculation (min): 60 min  Short Term Goals: Week 1:     Skilled Therapeutic Interventions/Progress Updates:    Patient seen this am to address functional mobility and dynamic balance during basic self care skills.  Patient unable to stand and fasten clothing with bilateral hands, however he is able to alternately use one hand and then the other to pul clothing up.  Wife able to assist with this at home, per patient.  Standing for 2 min at a time during ADL.  Patient did better by sitting in low recliner to reach LE for dressing.  More challenging to stand from this height, but more independent.     Therapy Documentation Precautions:  Restrictions Weight Bearing Restrictions: No   Pain:  Denies pain  See FIM for current functional status  Therapy/Group: Individual Therapy  Mariah Milling 05/31/2013, 1:00 PM

## 2013-06-01 ENCOUNTER — Inpatient Hospital Stay (HOSPITAL_COMMUNITY): Payer: BC Managed Care – PPO

## 2013-06-01 ENCOUNTER — Inpatient Hospital Stay (HOSPITAL_COMMUNITY): Payer: BC Managed Care – PPO | Admitting: Speech Pathology

## 2013-06-01 ENCOUNTER — Inpatient Hospital Stay (HOSPITAL_COMMUNITY): Payer: BC Managed Care – PPO | Admitting: Physical Therapy

## 2013-06-01 ENCOUNTER — Inpatient Hospital Stay (HOSPITAL_COMMUNITY): Payer: BC Managed Care – PPO | Admitting: *Deleted

## 2013-06-01 LAB — URINALYSIS, ROUTINE W REFLEX MICROSCOPIC
Bilirubin Urine: NEGATIVE
GLUCOSE, UA: NEGATIVE mg/dL
HGB URINE DIPSTICK: NEGATIVE
KETONES UR: NEGATIVE mg/dL
Leukocytes, UA: NEGATIVE
Nitrite: NEGATIVE
PH: 5 (ref 5.0–8.0)
Protein, ur: NEGATIVE mg/dL
Specific Gravity, Urine: 1.014 (ref 1.005–1.030)
Urobilinogen, UA: 0.2 mg/dL (ref 0.0–1.0)

## 2013-06-01 NOTE — Progress Notes (Signed)
Social Work Patient ID: Isaac Johnson, male   DOB: 1965/06/21, 48 y.o.   MRN: [id number] Met with pt and spoke with wife to discuss team conference progression toward goals and discharge still 5/6.  Wife aware need for family education and will plan to Come in next week.  Recommend OP therapies.  Pt is pleased with his progress.

## 2013-06-01 NOTE — Progress Notes (Signed)
Occupational Therapy Weekly Progress Note  Patient Details  Name: Isaac Johnson MRN: [id number] Date of Birth: 08/06/1965  Beginning of progress report period: May 23, 2013 End of progress report period: June 01, 2013  Today's Date: 06/01/2013 Time: 3953-2023 Time calculation (min): 51 min  Patient has met 4 of 4 short term goals.  Patient has progressing well in therapy. He has progressed to a light min assist with functional transfers and min assist for LB dressing. He has improved UB dressing to consistently supervision and recall of hemi dressing technique with min to no cues. Patient's HR is WFL during most of therapy session and has demonstrated improvement in initiation of rest breaks. Patient is limited at times by anxiety. Patient demonstrates active ROM in all joints in RUE. RUE limited functionally by decreased sustained muscle activation.    Patient continues to demonstrate the following deficits: R hemiplegia, decreased strength, decreased coordination, decreased muscle activation, decreased balance strategies, decreased standing balance, decreased functional use of RUE/RLE, decreased activity tolerance and therefore will continue to benefit from skilled OT intervention to enhance overall performance with BADLs, functional use of RUE/RLE, activity tolerance, standing balance.  Patient progressing toward long term goals..  Continue plan of care.  OT Short Term Goals Week 2:  OT Short Term Goal 1 (Week 2): RUE:  Patient will position and monitor RUE at all times during BADL and functional mobility with no more than 4 vcs per session OT Short Term Goal 1 - Progress (Week 2): Met OT Short Term Goal 2 (Week 2): Patient will complete 3/3 toilet tasks with steadying assist only OT Short Term Goal 2 - Progress (Week 2): Met OT Short Term Goal 3 (Week 2): Patient will complete LB dressing with min assist  OT Short Term Goal 3 - Progress (Week 2): Met OT Short Term Goal 4 (Week 2):  Patient will initiate use of RUE as stabilizer during self-care task with no cues OT Short Term Goal 4 - Progress (Week 2): Met Week 3:  OT Short Term Goal 1 (Week 3): Focus on LTGs  Skilled Therapeutic Interventions/Progress Updates:    Pt seen for ADL retraining with focus on hemi dressing technique, sit<>stand, and functional use of RUE. Pt received sitting in w/c reporting not feeling well overnight and very fatigued this AM. Completed bathing at sink with pt initiating using RUE to assist in wringing wash cloth out, washing LUE, and applying lotion to upper RUE. Completed sit<>stand multiple trials with min assist and min cues for use of BUE to assist with pushing to standing. Completed dressing with increased time and min cues for hemi dressing techniques. Pt requesting to return to bed before therapy session ended secondary to fatigue. Assisted pt to bed at min assist stand pivot transfer then pt willing to participate in [place] while in supine. Focused R NMR on sustained shoulder flexion at 90 degrees approx 5 sec and completed PNF patterns at min assist. Pt left supine in bed with all needs in reach.   Therapy Documentation Precautions:  Restrictions Weight Bearing Restrictions: No General:   Vital Signs: Therapy Vitals Temp: 98 F (36.7 C) Temp src: Oral Pulse Rate: 74 Resp: 18 BP: 110/74 mmHg Patient Position, if appropriate: Lying Oxygen Therapy SpO2: 97 % O2 Device: None (Room air) Pain:     Other Treatments:    See FIM for current functional status  Therapy/Group: Individual Therapy  Nakyah Erdmann N Staysha Truby 06/01/2013, 7:30 AM

## 2013-06-01 NOTE — Progress Notes (Signed)
Speech Language Pathology Daily Session Note  Patient Details  Name: Isaac Johnson MRN: [id number] Date of Birth: November 29, 1965  Today's Date: 06/01/2013 Time: 1350-1430 Time Calculation (min): 40 min  Short Term Goals: Week 3: SLP Short Term Goal 1 (Week 3): Pt. will demonstrate appropriate problem solving abilities during complex activities wtih supervision level cues SLP Short Term Goal 2 (Week 3): Pt. will recall and utilize speech strategies in conversation with supervision verbal cues. SLP Short Term Goal 3 (Week 3): Pt. will utilize memory strategies to recall important information with supervision level cues  Skilled Therapeutic Interventions: Skilled treatment session focused on addressing cognition goals.  SLP facilitated session with new learning task which patient completed with Mod I.  SLP requested patient recall new learning task from 2 days ago which he did with Supervision question cues and completed with Supervision cues to self-monitor and correct errors.  SLP related task to patient directing caregivers at home despite not being able to do things for himself being able to direct his care continues to give him independence.     FIM:  Comprehension Comprehension Mode: Auditory Comprehension: 5-Understands complex 90% of the time/Cues < 10% of the time Expression Expression Mode: Verbal Expression: 4-Expresses basic 75 - 89% of the time/requires cueing 10 - 24% of the time. Needs helper to occlude trach/needs to repeat words. Social Interaction Social Interaction: 6-Interacts appropriately with others with medication or extra time (anti-anxiety, antidepressant). Problem Solving Problem Solving: 5-Solves complex 90% of the time/cues < 10% of the time Memory Memory: 5-Requires cues to use assistive device  Pain Pain Assessment Pain Assessment: No/denies pain  Therapy/Group: Individual Therapy  Carmelia Roller., CCC-SLP [place] 06/01/2013, 4:42  PM

## 2013-06-01 NOTE — Progress Notes (Signed)
PMR Admission Coordinator Pre-Admission Assessment  Patient: Isaac Johnson is an 48 y.o., male  MRN: [id number]  DOB: 03/03/1965  Height: 6' (182.9 cm)  Weight: 77.3 kg (170 lb 6.7 oz)  Insurance Information  HMO: PPO: yes PCP: IPA: 80/20: OTHER:  PRIMARY: [employer] [place] Policy#: [id number] Subscriber: self  CM Name: Doroteo Glassman Phone#: [phone] ext tba Fax#: [phone]  Pre-Cert#: [id number] Employer: [employer] Update in 14 days.  Benefits: Phone #: [phone] Name: Langston Reusing. Date: 02/03/13 Deduct: $500 Out of Pocket Max: $3500 Life Max: unlimited  CIR: 80% with preauthorization SNF: 80% with preauthorization  Outpatient: 80% Co-Pay: 20%  Home Health: 80% with proauthorization Co-Pay: 20%  DME: 80% Co-Pay: 20%  Providers: In network   SECONDARY:  Medicaid Application Date: Case Manager:  Disability Application Date: Case Worker:  I asked wife to talk to [facility] and obtain paperwork for short term disability  Emergency Contact Information  Contact Information    Name  Relation  Home  Work  Mobile    Reeder,Detra  Spouse  [phone]   [phone]      Current Medical History  Patient Admitting Diagnosis: 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 with history of CHF, stomach ulcers, sciatica LLE, brain lipoma; who was admitted to [facility] on 05/06/13 with right side weakness. He was treated with tPA and transferred to [facility] for further treatment. CT head negative for acute changes. 2D echo with EF 25% with diffuse hypokinesis and grade 2 diastolic dysfunction. Carotid dopplers without significant ICA stenosis. Patient with improvement after tPA . 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 Estanislado Pandy.  He was evaluated by Dr. Aundra Dubin who who recommends heart cath after rehab recovery as an outpt. to rule out CAD as cause of CM and expressed concerns that CVA could be cardioembolic due to low EF. Medications adjusted and . He was started on ASA and plavix for secondary stroke prevention given stent in place. He has had high levels of anxiety with SOB (hyperventilating?) requiring ativan. CTA of chest negative for PE but with evidence of nodal prominence of questionable etiology.  Total: 12 NIH   Past Medical History  Past Medical History   Diagnosis  Date   .  Hypertension    .  Stomach ulcer    .  Gait instability    .  Proteinuria    .  GERD (gastroesophageal reflux disease)    .  Insomnia    .  Dizziness    .  Leukopenia    .  Sciatica      left   .  Brain lipoma  2010     Dr. Trenton Gammon   .  CHF (congestive heart failure)     Family History  family history includes Gout in his father; Stroke (age of onset: 46) in his mother.  Prior Rehab/Hospitalizations: no prior rehab  Current Medications  Current facility-administered medications:acetaminophen (TYLENOL) suppository 650 mg, 650 mg, Rectal, Q4H PRN, Roland Rack, MD, 650 mg at 05/07/13 2213; acetaminophen (TYLENOL) tablet 650  mg, 650 mg, Oral, Q4H PRN, Roland Rack, MD; ALPRAZolam Duanne Moron) tablet 0.25 mg, 0.25 mg, Oral, BID, Donzetta Starch, NP, 0.25 mg at 05/12/13 1021  alum & mag hydroxide-simeth (MAALOX/MYLANTA) 200-200-20 MG/5ML suspension 15 mL, 15 mL, Oral, Q6H PRN, Early Chars Rinehuls, PA-C, 15 mL at 05/08/13 2334; aspirin tablet 325 mg, 325 mg, Oral, Q breakfast, Rob Hickman, MD, 325 mg at 05/12/13 1021; bisacodyl (DULCOLAX) suppository 10 mg, 10 mg, Rectal, Daily PRN, Roland Rack, MD, 10 mg at 05/10/13 1945   carvedilol (COREG) tablet 3.125 mg, 3.125 mg, Oral, BID WC, David L Rinehuls, PA-C, 3.125 mg at 05/12/13 1021; clopidogrel (PLAVIX) tablet 75 mg, 75 mg, Oral, Q breakfast, Rob Hickman, MD, 75 mg at 05/12/13 1021; feeding supplement (ENSURE COMPLETE) (ENSURE COMPLETE) liquid 237 mL, 237 mL, Oral, BID BM, Heather Cornelison Pitts, RD, 237 mL at 05/12/13 1402  furosemide (LASIX) tablet 20 mg, 20 mg, Oral, Daily, Tarri Fuller, PA-C, 20 mg at 05/12/13 1022; labetalol (NORMODYNE,TRANDATE) injection 10 mg, 10 mg, Intravenous, Q10 min PRN, Roland Rack, MD, 10 mg at 05/10/13 0219; LORazepam (ATIVAN) tablet 1 mg, 1 mg, Oral, Q6H PRN, Wallie Char, 1 mg at 05/12/13 1402; losartan (COZAAR) tablet 25 mg, 25 mg, Oral, Daily, Tarri Fuller, PA-C, 25 mg at 05/12/13 1022  ondansetron (ZOFRAN) injection 4 mg, 4 mg, Intravenous, Q6H PRN, Wallie Char, 4 mg at 05/10/13 0347; pantoprazole (PROTONIX) EC tablet 80 mg, 80 mg, Oral, QHS, David L Rinehuls, PA-C, 80 mg at 05/11/13 2357; polyethylene glycol (MIRALAX / GLYCOLAX) packet 17 g, 17 g, Oral, Daily, Roland Rack, MD; spironolactone (ALDACTONE) tablet 12.5 mg, 12.5 mg, Oral, Daily, Tarri Fuller, PA-C, 12.5 mg at 05/12/13 1022  zolpidem (AMBIEN) tablet 5 mg, 5 mg, Oral, QHS PRN, Donzetta Starch, NP, 5 mg at 05/11/13 0008  Patients Current Diet: Dysphagia 2 diet with thin liquids  Precautions / Restrictions  Precautions  Precautions: Fall  Precautions/Special Needs: Swallowing  Precaution Comments: Rt hemiparesis  Restrictions  Weight Bearing Restrictions: No  Prior Activity Level  Community (5-7x/wk): Pt. is employed as a Quarry manager at [employer]. Was active and out of the house almost daily per pt. and wife.  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   Current Functional Level  Cognition  Arousal/Alertness: Awake/alert  Overall Cognitive Status: Impaired/Different from baseline  Current Attention Level: Selective  Orientation Level: Oriented X4  General Comments: Patient has difficulty sequencing tasks.  Attention: Alternating  Alternating Attention: Impaired  Alternating Attention Impairment: Verbal complex;Functional basic;Functional complex  Memory: Impaired  Memory Impairment: Decreased recall of new information  Awareness: Appears intact  Problem Solving: Impaired  Problem Solving Impairment: Verbal complex;Functional complex  Executive Function: Advertising account executive: Impaired  Organizing Impairment: Verbal complex  Safety/Judgment: Appears intact   Extremity Assessment  (includes Sensation/Coordination)     ADLs  Overall ADL's : Needs assistance/impaired  Eating/Feeding: Supervision/ safety;Set up;Sitting  Grooming: Wash/dry face;Set up;Supervision/safety;Sitting (applied lotion)  Grooming Details (indicate cue type and reason): using Lt. UE (is Rt. hand dominant)  Upper Body Bathing: Moderate assistance;Sitting (chair level)  Lower Body Bathing: Moderate assistance;Sit to/from stand  Upper Body Dressing : Set up;Supervision/safety;Sitting  Lower Body Dressing: Minimal assistance;Sitting/lateral leans (socks)  Toilet Transfer: Squat-pivot;Maximal assistance;+2 for physical assistance;Moderate assistance;+2 for safety/equipment (from bed to chair)  Toileting- Clothing  Manipulation and Hygiene: Sitting/lateral lean;Moderate assistance (toilet hygenie; mod A to stabilize R side for lateral lean)  Functional mobility during ADLs: Moderate assistance;+2 for physical assistance;Cueing for sequencing;Cueing for safety  General ADL Comments: Pt washed RUE sitting in chair with setup of RUE provided. Pt cued to use R hand to stabilize soap container, trace movement noted when asked to turn bottle to get soap out. Facilitated wb  through RUE/RLE during self care. Pt completed perianal care with right lateral lean in chair with therapist stabilizing and promoting wb through [place].   Mobility  Overal bed mobility: Needs Assistance  Bed Mobility: Sit to Supine  Rolling: Min assist;Mod assist (Min A to right; Mod A to left)  Sidelying to sit: Mod assist;+2 for safety/equipment  Supine to sit: Mod assist  Sit to supine: Mod assist  General bed mobility comments: max A to scoot up in bed. supine to sit with mod A.   Transfers  Overall transfer level: Needs assistance  Equipment used: 2 person hand held assist  Transfers: Sit to/from [facility]  Sit to Stand: +2 physical assistance;Min assist  Stand pivot transfers: +2 physical assistance;Mod assist  Squat pivot transfers: Mod assist;+2 physical assistance  General transfer comment: cues for sequencing, technique. R knee blocked in standing.   Ambulation / Gait / Stairs / Education administrator / Balance  Dynamic Sitting Balance  Sitting balance - Comments: Patient EOB x 5 min. Patient required cues for awareness of postural control. A required for safety EOB sitting.   Special needs/care consideration  BiPAP/CPAP no  CPM no  Continuous Drip IV no  Dialysis no  Life Vest no  Oxygen 2L O2 nasal cannula  Special Bed no  Trach Size no  Wound Vac (area) no  Skin no Location no  Bowel mgmt: Last BM 05/10/13  Bladder mgmt: condom cath  Diabetic mgmt no   Previous Home Environment  Living Arrangements: Spouse/significant other;Children;Other (Comment) (43 year old daughter)  Lives With: Spouse;Daughter  Available Help at Discharge: Available PRN/intermittently;Family (wire works 2nd shift at [employer])  Type of Home: [place]: One level  Home Access: Stairs to enter  Entrance Stairs-Rails: None  Technical brewer of Steps: 3  Bathroom Shower/Tub: Medical sales representative: Yes  How  Accessible: Accessible via walker  [place]: No  Discharge Living Setting  Plans for Discharge Living Setting: Patient's home  Type of Home at Discharge: House  Discharge Home Layout: One level  Discharge Home Access: Stairs to enter  Entrance Stairs-Rails: None  Entrance Stairs-Number of Steps: 3  Discharge Bathroom Shower/Tub: Tub/shower unit  Discharge Bathroom Toilet: Standard  Discharge Bathroom Accessibility: Yes  How Accessible: Accessible via walker  Does the patient have any problems obtaining your medications?: No  Social/Family/Support Systems  Patient Roles: Spouse;Parent (full time employee)  Caregiver Availability: Other (Comment) (works 2nd shift)  Discharge Plan Discussed with Primary Caregiver: Yes  Is Caregiver In Agreement with Plan?: Yes  Does Caregiver/Family have Issues with Lodging/Transportation while Pt is in Rehab?: No  Goals/Additional Needs  Patient/Family Goal for Rehab: supervision to min assist for PT/OT, supervision for ST  Expected length of stay: 20-24 days  Cultural Considerations: no  Dietary Needs: dysphagia 2 with thin liquids  Pt/Family Agrees to Admission and willing to participate: Yes  Program Orientation Provided & Reviewed with Pt/Caregiver Including Roles & Responsibilities: Yes  Decrease burden of Care through IP rehab admission:  n/a  Possible need for SNF placement upon discharge:not anticipated. Wife is discucussing with two people to assist her when she is working.  Patient Condition: This patient's condition remains as documented in the consult dated 05/09/13, in which the Rehabilitation Physician determined and documented that the patient's condition is appropriate for intensive rehabilitative care in an inpatient rehabilitation facility. Will admit to inpatient rehab today.  Preadmission Screen Completed By: Dot Been , 05/12/2013 3:10 PM  ______________________________________________________________________   Discussed status with Dr. Letta Pate on 05/12/13 at 1510 and received telephone approval for admission today.  Admission Coordinator: Cleatrice Burke, PT time 8315 Date 05/12/13.  Cosigned by: Charlett Blake, MD [05/12/2013 3:20 PM

## 2013-06-01 NOTE — Patient Care Conference (Signed)
Inpatient RehabilitationTeam Conference and Plan of Care Update Date: 06/01/2013   Time: 10;50 AM    Patient Name: Isaac Johnson      Medical Record Number: [id number]  Date of Birth: 1965/08/14 Sex: Male         Room/Bed: 4W07C/4W07C-01 Payor Info: Payor: [place] / Plan: BCBS [place] PPO / Product Type: *No Product type* /    Admitting Diagnosis: CVA  Admit Date/Time:  05/24/2013  3:48 PM Admission Comments: No comment available   Primary Diagnosis:  <principal problem not specified> Principal Problem: <principal problem not specified>  Patient Active Problem List   Diagnosis Date Noted  . CHF with cardiomyopathy 05/23/2013  . NICM (nonischemic cardiomyopathy) 05/23/2013  . Bradycardia 05/17/2013  . Acute systolic heart failure   . 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 Anxiety state, unspecified 05/12/2013  . CVA (cerebral infarction) 05/12/2013  . Systolic dysfunction -EF 01% by 2D echo 05/2013 05/11/2013  . 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 LEUKOPENIA, MILD 10/03/2008  . OTHER ABNORMAL BLOOD CHEMISTRY 10/03/2008  . PROTEINURIA 08/20/2006  . HYPERTENSION 07/23/2006  . GERD 07/23/2006    Expected Discharge Date: Expected Discharge Date: 06/08/13  Team Members Present: Physician leading conference: Dr. Alysia Penna Social Worker Present: Ovidio Kin, LCSW;Jenny Prevatt, LCSW Nurse Present: Elliot Cousin, RN PT Present: Georjean Mode, PT;Blair Hobble, PT OT Present: Gareth Morgan, OT SLP Present: Germain Osgood, SLP PPS Coordinator present : Ileana Ladd, Lelan Pons, RN, CRRN     Current Status/Progress Goal Weekly Team Focus  Medical   Anxiety improve, gagging improved, clear coronary arteries  Advance activity as tolerated, avoid cardiac decompensation  Continue therapy   Bowel/Bladder   Continent of bowel and bladder, uses urinal, LBM 4/27  Continent of bowel and bladder  continue with plan of care .    Swallow/Nutrition/ Hydration   Dys.2 textures and thin liquids with intermittent supervision   supervision with least restrictive PO  trials of Dys.3 textures    ADL's   min assist ADL  supervision overall  activity tolerance, neuromuscular reeducation right upper extremities   Mobility   Min A overall  Supervision overall  Functional transfers, gait training, R NMR, activity tolerance, continued family training   Communication     na        Safety/Cognition/ Behavioral Observations  Supervision-Min assist  Supervision   continue to increase self-monitoring and correcting   Pain   No c/o pain  Pain below 3      Skin   skin intact  Skin to remain intact and free of breakdown       Rehab Goals Patient on target to meet rehab goals: Yes *See Care Plan and progress notes for long and short-term goals.  Barriers to Discharge: Poor endurance    Possible Resolutions to Barriers:  See above    Discharge Planning/Teaching Needs:  Home with wife who is arranging 24 hr care-both aware of the need.  Will need to begin family education      Team Discussion:  Activity tolerance better, making progress.  Need for family education to begin. Since starting celexa less anxiety.  Trials of Dys 3 diet  Revisions to Treatment Plan:  None   Continued Need for Acute Rehabilitation Level of Care: The patient requires daily medical management by a physician with specialized training in physical medicine and rehabilitation for the following conditions: Daily direction of a multidisciplinary physical rehabilitation program to ensure safe  treatment while eliciting the highest outcome that is of practical value to the patient.: Yes Daily medical management of patient stability for increased activity during participation in an intensive rehabilitation regime.: Yes Daily analysis of laboratory values and/or radiology reports with any subsequent need for medication adjustment of medical intervention for  : Neurological problems;Cardiac problems  Elease Hashimoto 06/01/2013, 2:19 PM

## 2013-06-01 NOTE — Progress Notes (Signed)
Physical Therapy Session Note  Patient Details  Name: Isaac Johnson MRN: [id number] Date of Birth: 10-Mar-1965  Today's Date: 06/01/2013 Time: 8295-6213 Time Calculation (min): 28 min  Short Term Goals: Week 3:  PT Short Term Goal 1 (Week 3): Pt will consistently perform bed<>chair transfer with supervision. PT Short Term Goal 2 (Week 3): Pt will ambulate x100' in controlled environment with min A using LRAD. PT Short Term Goal 3 (Week 3): Pt will negotiate 3 steps with single hand rail and max A.  Skilled Therapeutic Interventions/Progress Updates:    Patient received semi-reclined in bed. Session focused on motor control and NMR of R LE. Functional transfers performed with minA via stand pivot. Standing L LE stair taps with 6 in step, no UE support and minA, to facilitate increased weight bearing through R LE. Manual facilitation to prevent R genu recurvatum and tactile cues to facilitate slight R knee flexion with increased weight bearing. TKE in standing with L UE support, green theraband, with emphasis on slow controlled movements to improve motor control of R knee. Patient returned to room and left semi-reclined in bed, bed alarm on, and all needs within reach.  Therapy Documentation Precautions:  Precautions Precautions: Fall Precaution Comments: Pusher tendencies, Right hemi, Right inattention Restrictions Weight Bearing Restrictions: No Pain: Pain Assessment Pain Assessment: No/denies pain Pain Score: 0-No pain Faces Pain Scale: No hurt Locomotion : Ambulation Ambulation/Gait Assistance: Not tested (comment)   See FIM for current functional status  Therapy/Group: Individual Therapy  Lillia Abed. Sonia Bromell, PT, DPT 06/01/2013, 12:07 PM

## 2013-06-01 NOTE — Progress Notes (Addendum)
Subjective/Complaints: occ gagging C/o pain and bruiding from heparin injections, ambulating 50-150 ft Review of Systems - Negative except cough  Objective: Vital Signs: Blood pressure 110/74, pulse 74, temperature 98 F (36.7 C), temperature source Oral, resp. rate 18, SpO2 97.00%. No results found. No results found for this or any previous visit (from the past 72 hour(s)).   HEENT: normal Cardio: RRR and no murmur Resp: CTA B/L and unlabored GI: BS positive and unlabored Extremity:  Pulses positive and No Edema Skin:   Intact Neuro: Alert/Oriented, Cranial Nerve II-XII normal, Abnormal Motor 3- RUE,and RLE except 2- Left ankle, Tone:  Hypertonia and Dysarthric Musc/Skel:  Normal Gen NAD   Assessment/Plan: 1. Functional deficits secondary to right hemiparesis Left embolic MCA infarct which require 3+ hours per day of interdisciplinary therapy in a comprehensive inpatient rehab setting. Physiatrist is providing close team supervision and 24 hour management of active medical problems listed below. Physiatrist and rehab team continue to assess barriers to discharge/monitor patient progress toward functional and medical goals. Team conference today please see physician documentation under team conference tab, met with team face-to-face to discuss problems,progress, and goals. Formulized individual treatment plan based on medical history, underlying problem and comorbidities. FIM: FIM - Bathing Bathing Steps Patient Completed: Chest;Right Arm;Abdomen;Front perineal area;Buttocks;Right upper leg;Left upper leg;Right lower leg (including foot);Left lower leg (including foot) Bathing: 4: Min-Patient completes 8-9 [id number] parts or 75+ percent  FIM - Upper Body Dressing/Undressing Upper body dressing/undressing steps patient completed: Thread/unthread right sleeve of pullover shirt/dresss;Thread/unthread left sleeve of pullover shirt/dress;Put head through opening of pull over  shirt/dress;Pull shirt over trunk Upper body dressing/undressing: 5: Set-up assist to: Obtain clothing/put away FIM - Lower Body Dressing/Undressing Lower body dressing/undressing steps patient completed: Thread/unthread right underwear leg;Thread/unthread left underwear leg;Pull underwear up/down;Thread/unthread right pants leg;Thread/unthread left pants leg;Pull pants up/down;Don/Doff left shoe;Don/Doff right shoe;Fasten/unfasten right shoe;Fasten/unfasten left shoe Lower body dressing/undressing: 4: Min-Patient completed 75 plus % of tasks  FIM - Toileting Toileting steps completed by patient: Performs perineal hygiene Toileting Assistive Devices: Grab bar or rail for support Toileting: 2: Max-Patient completed 1 of 3 steps  FIM - TRadio producerDevices: Grab bars Toilet Transfers: 3-To toilet/BSC: Mod A (lift or lower assist);3-From toilet/BSC: Mod A (lift or lower assist)  FIM - BControl and instrumentation engineerDevices: Orthosis;Walker Bed/Chair Transfer: 4: Supine > Sit: Min A (steadying Pt. > 75%/lift 1 leg);5: Sit > Supine: Supervision (verbal cues/safety issues);4: Bed > Chair or W/C: Min A (steadying Pt. > 75%);4: Chair or W/C > Bed: Min A (steadying Pt. > 75%)  FIM - Locomotion: Wheelchair Distance: 75 Locomotion: Wheelchair: 0: Activity did not occur FIM - Locomotion: Ambulation Locomotion: Ambulation Assistive Devices: Walker - Rolling;Orthosis (R AFO, R hand orthosis) Ambulation/Gait Assistance: 4: Min guard Locomotion: Ambulation: 2: Travels 50 - 149 ft with minimal assistance (Pt.>75%)  Comprehension Comprehension Mode: Auditory Comprehension: 5-Understands complex 90% of the time/Cues < 10% of the time  Expression Expression Mode: Verbal Expression: 5-Expresses basic 90% of the time/requires cueing < 10% of the time.  Social Interaction Social Interaction: 6-Interacts appropriately with others with medication or extra  time (anti-anxiety, antidepressant).  Problem Solving Problem Solving: 5-Solves basic problems: With no assist  Memory Memory: 6-More than reasonable amt of time  Medical Problem List and Plan:  1. 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 Status post revascularization with clot retrieval  2. DVT Prophylaxis/Anticoagulation: SCDs.no signs of DVT , d/c  SQ heparin 3. Pain Management: Tylenol as needed  4. Mood/anxiety: Xanax 0.5 mg tid prn,  Bed alarm for safety  5. Neuropsych: This patient is capable of making decisions on his own behalf.  6. Dysphagia. Dysphagia 2 thin liquids. Followup speech therapy  7.Hypertension. Cozaar 25 mg daily, Lasix 20 mg daily, Aldactone 12.5 mg daily. Monitor with increased mobility  8. Cardiomyopathy/CHF. Monitor for any signs of fluid overload. Followup cardiology services. Cardiac cath clean, low EJ fx non ischemic LOS (Days) 8 A FACE TO FACE EVALUATION WAS PERFORMED  Charlett Blake 06/01/2013, 8:31 AM

## 2013-06-01 NOTE — Progress Notes (Signed)
Physical Therapy Session Note  Patient Details  Name: Isaac Johnson MRN: [id number] Date of Birth: Aug 12, 1965  Today's Date: 06/01/2013 Time: 9476-5465 Time Calculation (min): 60 min  Short Term Goals: Week 3:  PT Short Term Goal 1 (Week 3): Pt will consistently perform bed<>chair transfer with supervision. PT Short Term Goal 2 (Week 3): Pt will ambulate x100' in controlled environment with min A using LRAD. PT Short Term Goal 3 (Week 3): Pt will negotiate 3 steps with single hand rail and max A.  Skilled Therapeutic Interventions/Progress Updates:  Pt received semi-reclined in bed; agreeable to therapy. Session focused on increasing neuromuscular control of R knee to prevent R genu recurvatum during dynamic standing, functional transfers, and gait. W/c mobility x115' in controlled environment with bilat LE's, supervision, mod cueing for R-sided obstacle negotiation. Verbal/tactile cueing to promote utilization of RUE during transfers with effective within-session carryover. See below for detailed description of gait training. Session ended in pt room, where pt was left semi-reclined in bed with 3 bed rails up, bed alarm on, and all needs within reach.   Therapy Documentation Precautions:  Precautions Precautions: Fall Precaution Comments: Pusher tendencies, Right hemi, Right inattention Restrictions Weight Bearing Restrictions: No Vital Signs: Therapy Vitals Temp: 98.3 F (36.8 C) Temp src: Oral Pulse Rate: 75 Resp: 18 BP: 118/76 mmHg Patient Position, if appropriate: Sitting Oxygen Therapy SpO2: 99 % O2 Device: None (Room air) Pain: Pain Assessment Pain Assessment: No/denies pain Locomotion : Ambulation Ambulation/Gait Assistance: 4: Min assist Wheelchair Mobility Distance: 115  Gait Training: Donned ACE bandages x2 at RLE to prevent R genu recurvatum by providing maintaining R ankle dorsiflexion, controlling speed of eccentric plantar flexion, and increasing  proprioceptive input. Placed heel lifts x2 in R shoe to increase R knee flexion moment during RLE midstance. Pt then performed multiple short-distance gait trials with bilat UE support at parallel bars; tactile cueing at R gluteus maximus to promote R hip extension, decrease anterior trunk lean during RLE stance phase. Verbal cueing for upright posture.   Performed 20' gait trials x4 total (seated rest breaks between trials) with rolling walker, R hand orthosis, close supervision to min A. Utilized rolling walker for gait trials secondary to decreased occurrence of R genu recurvatum with rolling walker as compared with SBQC. Initial trial with ACE bandages x2, subsequent trial with ACE bandages x1, and final 2 trials without ACE bandages. Pt demonstrated no R genu recurvatum during said gait trials until final 5' of last trial. Frequent verbal cueing provided throughout for upright posture; tactile cueing at R gluteus maximus for activation of R hip extensors during RLE loading response.   See FIM for current functional status  Therapy/Group: Individual Therapy  Malva Cogan Maysin Carstens 06/01/2013, 4:21 PM

## 2013-06-02 ENCOUNTER — Inpatient Hospital Stay (HOSPITAL_COMMUNITY): Payer: BC Managed Care – PPO | Admitting: Physical Therapy

## 2013-06-02 ENCOUNTER — Encounter (HOSPITAL_COMMUNITY): Payer: BC Managed Care – PPO | Admitting: Occupational Therapy

## 2013-06-02 ENCOUNTER — Ambulatory Visit (HOSPITAL_COMMUNITY): Payer: BC Managed Care – PPO

## 2013-06-02 ENCOUNTER — Inpatient Hospital Stay (HOSPITAL_COMMUNITY): Payer: BC Managed Care – PPO | Admitting: Occupational Therapy

## 2013-06-02 DIAGNOSIS — I69922 Dysarthria following unspecified cerebrovascular disease: Secondary | ICD-10-CM

## 2013-06-02 DIAGNOSIS — I69959 Hemiplegia and hemiparesis following unspecified cerebrovascular disease affecting unspecified side: Secondary | ICD-10-CM

## 2013-06-02 DIAGNOSIS — I6350 Cerebral infarction due to unspecified occlusion or stenosis of unspecified cerebral artery: Secondary | ICD-10-CM

## 2013-06-02 DIAGNOSIS — I69991 Dysphagia following unspecified cerebrovascular disease: Secondary | ICD-10-CM

## 2013-06-02 NOTE — Progress Notes (Signed)
Subjective/Complaints: occ gagging Eating without cough SLP trying D 3 today Review of Systems - Negative except cough  Objective: Vital Signs: Blood pressure 112/78, pulse 81, temperature 97.3 F (36.3 C), temperature source Axillary, resp. rate 18, weight 72.9 kg (160 lb 11.5 oz), SpO2 99.00%. No results found. Results for orders placed during the hospital encounter of 05/24/13 (from the past 72 hour(s))  URINALYSIS, ROUTINE W REFLEX MICROSCOPIC     Status: None   Collection Time    06/01/13  2:58 PM      Result Value Ref Range   Color, Urine YELLOW  YELLOW   APPearance CLEAR  CLEAR   Specific Gravity, Urine 1.014  1.005 - 1.030   pH 5.0  5.0 - 8.0   Glucose, UA NEGATIVE  NEGATIVE mg/dL   Hgb urine dipstick NEGATIVE  NEGATIVE   Bilirubin Urine NEGATIVE  NEGATIVE   Ketones, ur NEGATIVE  NEGATIVE mg/dL   Protein, ur NEGATIVE  NEGATIVE mg/dL   Urobilinogen, UA 0.2  0.0 - 1.0 mg/dL   Nitrite NEGATIVE  NEGATIVE   Leukocytes, UA NEGATIVE  NEGATIVE   Comment: MICROSCOPIC NOT DONE ON URINES WITH NEGATIVE PROTEIN, BLOOD, LEUKOCYTES, NITRITE, OR GLUCOSE <1000 mg/dL.     HEENT: normal Cardio: RRR and no murmur Resp: CTA B/L and unlabored GI: BS positive and unlabored Extremity:  Pulses positive and No Edema Skin:   Intact Neuro: Alert/Oriented, Cranial Nerve II-XII normal, Abnormal Motor 3- RUE,and RLE except 2- Left ankle, Tone:  Hypertonia and Dysarthric Musc/Skel:  Normal Gen NAD   Assessment/Plan: 1. Functional deficits secondary to right hemiparesis Left embolic MCA infarct which require 3+ hours per day of interdisciplinary therapy in a comprehensive inpatient rehab setting. Physiatrist is providing close team supervision and 24 hour management of active medical problems listed below. Physiatrist and rehab team continue to assess barriers to discharge/monitor patient progress toward functional and medical goals.  FIM: FIM - Bathing Bathing Steps Patient Completed:  Chest;Right Arm;Abdomen;Front perineal area;Buttocks;Right upper leg;Left upper leg;Right lower leg (including foot);Left lower leg (including foot) Bathing: 4: Min-Patient completes 8-9 44f 10 parts or 75+ percent  FIM - Upper Body Dressing/Undressing Upper body dressing/undressing steps patient completed: Thread/unthread right sleeve of pullover shirt/dresss;Thread/unthread left sleeve of pullover shirt/dress;Put head through opening of pull over shirt/dress;Pull shirt over trunk Upper body dressing/undressing: 5: Set-up assist to: Obtain clothing/put away FIM - Lower Body Dressing/Undressing Lower body dressing/undressing steps patient completed: Thread/unthread right underwear leg;Thread/unthread left underwear leg;Pull underwear up/down;Thread/unthread right pants leg;Thread/unthread left pants leg;Pull pants up/down;Don/Doff left sock;Don/Doff right sock Lower body dressing/undressing: 4: Steadying Assist  FIM - Toileting Toileting steps completed by patient: Performs perineal hygiene Toileting Assistive Devices: Grab bar or rail for support Toileting: 2: Max-Patient completed 1 of 3 steps  FIM - Radio producer Devices: Grab bars Toilet Transfers: 3-To toilet/BSC: Mod A (lift or lower assist);3-From toilet/BSC: Mod A (lift or lower assist)  FIM - Bed/Chair Transfer Bed/Chair Transfer Assistive Devices: Arm rests;Walker;Orthosis (R hand orthosis) Bed/Chair Transfer: 5: Supine > Sit: Supervision (verbal cues/safety issues);4: Chair or W/C > Bed: Min A (steadying Pt. > 75%);5: Sit > Supine: Supervision (verbal cues/safety issues);4: Bed > Chair or W/C: Min A (steadying Pt. > 75%)  FIM - Locomotion: Wheelchair Distance: 115 Locomotion: Wheelchair: 2: Travels 50 - 149 ft with supervision, cueing or coaxing FIM - Locomotion: Ambulation Locomotion: Ambulation Assistive Devices: Walker - Rolling;Orthosis (R hand orthosis; ACE bandage at RLE to prevent genu  recurvatum) Ambulation/Gait  Assistance: 4: Min assist Locomotion: Ambulation: 2: Travels 17 - 149 ft with minimal assistance (Pt.>75%)  Comprehension Comprehension Mode: Auditory Comprehension: 5-Understands complex 90% of the time/Cues < 10% of the time  Expression Expression Mode: Verbal Expression: 4-Expresses basic 75 - 89% of the time/requires cueing 10 - 24% of the time. Needs helper to occlude trach/needs to repeat words.  Social Interaction Social Interaction: 6-Interacts appropriately with others with medication or extra time (anti-anxiety, antidepressant).  Problem Solving Problem Solving: 5-Solves complex 90% of the time/cues < 10% of the time  Memory Memory: 5-Requires cues to use assistive device  Medical Problem List and Plan:  1. 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 Status post revascularization with clot retrieval  2. DVT Prophylaxis/Anticoagulation: SCDs.no signs of DVT , d/c SQ heparin 3. Pain Management: Tylenol as needed  4. Mood/anxiety: Xanax 0.5 mg tid prn,  Bed alarm for safety  5. Neuropsych: This patient is capable of making decisions on his own behalf.  6. Dysphagia. Dysphagia 2 thin liquids. Trial of D 3 today 7.Hypertension. Cozaar 25 mg daily, Lasix 20 mg daily, Aldactone 12.5 mg daily. Monitor with increased mobility  8. Cardiomyopathy/CHF. Monitor for any signs of fluid overload. Followup cardiology services. Cardiac cath clean, low EJ fx non ischemic LOS (Days) 9 A FACE TO FACE EVALUATION WAS PERFORMED  Charlett Blake 06/02/2013, 7:58 AM

## 2013-06-02 NOTE — Progress Notes (Signed)
Social Work Patient ID: Isaac Johnson, male   DOB: [birth year], 48 y.o.   MRN: [id number] Met with pt and spoke with wife to discuss team conference progression toward goals and discharge 5/6.  Need to begin family education and have scheduled For Monday at 10;00 with wife.  Agreeable to OP therapies.  Will work toward discharge, pt pleased with his progress and feels good about discharge.

## 2013-06-02 NOTE — Progress Notes (Signed)
Occupational Therapy Session Note  Patient Details  Name: Isaac Johnson MRN: [id number] Date of Birth: 06-03-1965  Today's Date: 06/02/2013 Time: 1330-1403 Time Calculation (min): 33 min  Short Term Goals: Week 2:  OT Short Term Goal 1 (Week 2): RUE:  Patient will position and monitor RUE at all times during BADL and functional mobility with no more than 4 vcs per session OT Short Term Goal 1 - Progress (Week 2): Met OT Short Term Goal 2 (Week 2): Patient will complete 3/3 toilet tasks with steadying assist only OT Short Term Goal 2 - Progress (Week 2): Met OT Short Term Goal 3 (Week 2): Patient will complete LB dressing with min assist  OT Short Term Goal 3 - Progress (Week 2): Met OT Short Term Goal 4 (Week 2): Patient will initiate use of RUE as stabilizer during self-care task with no cues OT Short Term Goal 4 - Progress (Week 2): Met  Skilled Therapeutic Interventions/Progress Updates:    Patient seen this pm to address neuromuscular re-education to right upper extremity.  Patient with incorrect timing and sequencing when firing muscles to activate right arm.  He has increased internal rotation, adduction with attempts to flex shoulder.  He overuses trunk activity in an attempt to move right hand.  He maintains weight bias toward left side, and resists weight shift toward right.  Patient fearful of right weight shift in both sitting and in standing.  Worked to increase correct muscle activation for single joint and then multi-joint limb control.  Patient now able to isolate each individual joint through nearly full range, but has difficulty sustaining against gravity.  He cannot yet control multiple joints simultaneously as needed for function.  Patient may benefit from a right wrist orthosis to promote better alignment of carpal bones.    Therapy Documentation Precautions:  Precautions Precautions: Fall Precaution Comments: Right inattention Restrictions Weight Bearing Restrictions:  No   Pain: Pain Assessment Pain Assessment: No/denies pain    See FIM for current functional status  Therapy/Group: Individual Therapy  Mariah Milling 06/02/2013, 3:37 PM

## 2013-06-02 NOTE — Progress Notes (Signed)
Physical Therapy Session Note  Patient Details  Name: Isaac Johnson MRN: [id number] Date of Birth: [birth year]  Today's Date: 06/02/2013 Time: 1000-1100 Time Calculation (min): 60 min  Short Term Goals: Week 3:  PT Short Term Goal 1 (Week 3): Pt will consistently perform bed<>chair transfer with supervision. PT Short Term Goal 2 (Week 3): Pt will ambulate x100' in controlled environment with min A using LRAD. PT Short Term Goal 3 (Week 3): Pt will negotiate 3 steps with single hand rail and max A.  Skilled Therapeutic Interventions/Progress Updates:   Pt received semi reclined in bed, agreeable to therapy. Sit <> supine from flat bed with supervision and donned shoes/AFO sitting EOB. Pt performed stand pivot transfer bed <> w/c with supervision and propelled w/c using BLEs > 150 ft x 2 room <> ortho gym with supervision and rest as needed due to fatigue. Pt negotiated up/down 4 stairs using 2 rails with min guard, pt able to correctly recall sequence and min verbal cues to advance RUE on rail. Pt performed car transfer using RW with supervision/setup. Gait training multiple trials on unit 30-50 ft using RW and AFO donned with supervision-min assist in controlled environment. Gait training 25-50 ft x 4 using RW and AFO in community environment including thresholds, incline, decline, outdoor sidewalks, and in lobby on tile for a variety of surfaces with min-mod assist with verbal cues for RLE foot clearance and attention to environment for safety. Pt required mod verbal cues throughout session to utilize RUE for sit <> stand transfers to w/c and benches in community environment. Pt returned to room, requesting to return to bed and left semi reclined with all needs within reach.   Therapy Documentation Precautions:  Precautions Precautions: Fall Precaution Comments: Right inattention Restrictions Weight Bearing Restrictions: No Pain: Pain Assessment Pain Assessment: No/denies pain  See FIM for  current functional status  Therapy/Group: Individual Therapy  Laretta Alstrom 06/02/2013, 12:28 PM

## 2013-06-02 NOTE — Progress Notes (Signed)
Occupational Therapy Session Note  Patient Details  Name: Isaac Johnson MRN: [id number] Date of Birth: October 27, 1965  Today's Date: 06/02/2013 Time: 0802-0900 Time Calculation (min): 58 min  Short Term Goals: Week 2:  OT Short Term Goal 1 (Week 2): RUE:  Patient will position and monitor RUE at all times during BADL and functional mobility with no more than 4 vcs per session OT Short Term Goal 1 - Progress (Week 2): Met OT Short Term Goal 2 (Week 2): Patient will complete 3/3 toilet tasks with steadying assist only OT Short Term Goal 2 - Progress (Week 2): Met OT Short Term Goal 3 (Week 2): Patient will complete LB dressing with min assist  OT Short Term Goal 3 - Progress (Week 2): Met OT Short Term Goal 4 (Week 2): Patient will initiate use of RUE as stabilizer during self-care task with no cues OT Short Term Goal 4 - Progress (Week 2): Met  Skilled Therapeutic Interventions/Progress Updates:   Patient seen this am for OT intervention to address neuromuscular re-education to right extremities during various transitional movements and functional activites for basic self care skills.  Encouraged patient when transitioning form stand to sit to reach behind with right hand to grasp arm of wheelchair and to lower self to foster right sided weight shift.  With right arm, patient able to wash left arm/axilla, hold toothbrush while applying toothpaste, hold deoderant to open cap, and apply deoderant to left axilla.   Patient taken to gym to address neuromuscular re-education to right upper extremity.  Patient worked to transition from supine to sidelying on right and then sidesitting on right forearm.  Patient able to sustain enough muscle activation in right shoulder girdle to lift and lower trunk toward mat with minimal assistance.    Therapy Documentation Precautions:  Precautions Precautions: Fall Precaution Comments: Right inattention Restrictions Weight Bearing Restrictions:  No   Pain: Pain Assessment Pain Assessment: No/denies pain  See FIM for current functional status  Therapy/Group: Individual Therapy  Mariah Milling 06/02/2013, 3:46 PM

## 2013-06-03 ENCOUNTER — Inpatient Hospital Stay (HOSPITAL_COMMUNITY): Payer: BC Managed Care – PPO

## 2013-06-03 ENCOUNTER — Inpatient Hospital Stay (HOSPITAL_COMMUNITY): Payer: BC Managed Care – PPO | Admitting: *Deleted

## 2013-06-03 ENCOUNTER — Inpatient Hospital Stay (HOSPITAL_COMMUNITY): Payer: BC Managed Care – PPO | Admitting: Physical Therapy

## 2013-06-03 DIAGNOSIS — I6350 Cerebral infarction due to unspecified occlusion or stenosis of unspecified cerebral artery: Secondary | ICD-10-CM

## 2013-06-03 DIAGNOSIS — I69991 Dysphagia following unspecified cerebrovascular disease: Secondary | ICD-10-CM

## 2013-06-03 DIAGNOSIS — I69959 Hemiplegia and hemiparesis following unspecified cerebrovascular disease affecting unspecified side: Secondary | ICD-10-CM

## 2013-06-03 DIAGNOSIS — I69922 Dysarthria following unspecified cerebrovascular disease: Secondary | ICD-10-CM

## 2013-06-03 MED ORDER — GABAPENTIN 300 MG PO CAPS
300.0000 mg | ORAL_CAPSULE | Freq: Every day | ORAL | Status: DC
  Administered 2013-06-03 – 2013-06-07 (×5): 300 mg via ORAL
  Filled 2013-06-03 (×6): qty 1

## 2013-06-03 NOTE — Progress Notes (Signed)
Occupational Therapy Session Note  Patient Details  Name: Isaac Johnson MRN: [id number] Date of Birth: 05/30/1965  Today's Date: 06/03/2013 Time: 0800-0900 Time Calculation (min):60 min  Short Term Goals: Week 2:  OT Short Term Goal 1 (Week 2): RUE:  Patient will position and monitor RUE at all times during BADL and functional mobility with no more than 4 vcs per session OT Short Term Goal 1 - Progress (Week 2): Met OT Short Term Goal 2 (Week 2): Patient will complete 3/3 toilet tasks with steadying assist only OT Short Term Goal 2 - Progress (Week 2): Met OT Short Term Goal 3 (Week 2): Patient will complete LB dressing with min assist  OT Short Term Goal 3 - Progress (Week 2): Met OT Short Term Goal 4 (Week 2): Patient will initiate use of RUE as stabilizer during self-care task with no cues OT Short Term Goal 4 - Progress (Week 2): Met Week 3:  OT Short Term Goal 1 (Week 3): Focus on LTGs  Skilled Therapeutic Interventions/Progress Updates:      HR=  80. Oxy2  99 Focus of treatment Neuromuscular re-education to right extremities , sit to stand, standing balance, endurance for basic self care skills.  Pt. Utilized RUE during bathing with minimal assist for increased pressure for increased weight bearing on body parts.  Pt. Washed left axilla, applied deodorant with minimal assist for weight bearing.  Provided myotherapy to right neck area due to stiffness and soreness from sleeping.  Pt started coughing at end of session.  Supplied some water and pt got better.   HR=44; Oxy2 98.    Therapy Documentation Precautions:  Precautions Precautions: Fall Precaution Comments: Right inattention Restrictions Weight Bearing Restrictions: No General:   Vital Signs: SpO2: 99 % O2 Device: None (Room air) Pain:  none            See FIM for current functional status  Therapy/Group: Individual Therapy  Lisa Roca 06/03/2013, 8:51 AM

## 2013-06-03 NOTE — Progress Notes (Signed)
Speech Language Pathology Daily Session Note  Patient Details  Name: Isaac Johnson MRN: [id number] Date of Birth: 1965/03/19  Treatment date: 06/02/2013 Time: 1130-1225 Time Calculation (min): 55 min  Short Term Goals: Week 3: SLP Short Term Goal 1 (Week 3): Pt. will demonstrate appropriate problem solving abilities during complex activities wtih supervision level cues SLP Short Term Goal 2 (Week 3): Pt. will recall and utilize speech strategies in conversation with supervision verbal cues. SLP Short Term Goal 3 (Week 3): Pt. will utilize memory strategies to recall important information with supervision level cues  Skilled Therapeutic Interventions:   Skilled treatment focused on swallowing goals. SLP facilitated session with observation of lunch meal consisting of advanced Dys 3 textures and thin liquids via sips from can. Pt consumed meal with no overt s/s of aspiration, utilizing safe swallowing strategies with Mod I. Mild eructation was noted throughout meal. SLP provided education to patient regarding advanced diet textures, updated signage in room, and notified RN/NT. Continue plan of care.  FIM:  Comprehension Comprehension Mode: Auditory Comprehension: 5-Understands basic 90% of the time/requires cueing < 10% of the time Expression Expression Mode: Verbal Expression: 5-Expresses basic 90% of the time/requires cueing < 10% of the time. Social Interaction Social Interaction: 6-Interacts appropriately with others with medication or extra time (anti-anxiety, antidepressant). Problem Solving Problem Solving: 5-Solves complex 90% of the time/cues < 10% of the time Memory Memory: 5-Recognizes or recalls 90% of the time/requires cueing < 10% of the time  Pain Pain Assessment Pain Assessment: No/denies pain  Therapy/Group: Individual Therapy   Isaac Johnson, M.A. CCC-SLP [phone]  Isaac Johnson 06/03/2013, 10:41 AM

## 2013-06-03 NOTE — Progress Notes (Addendum)
Physical Therapy Weekly Progress Note  Patient Details  Name: Isaac Johnson MRN: [id number] Date of Birth: [birth year]  Beginning of progress report period: May 28, 2013 End of progress report period: Jun 03, 2013  Today's Date: 06/03/2013 Time:  4970-2637 and 8588-5027 Time Calculation: 45 min and 23 min  Patient has met 3 of 3 short term goals.  Pt exhibits significant improvement in activity tolerance, gait stability, and independence with functional transfers and stair negotiation. Pt anxiety and SOB are have not been significant barriers to pt progress during this reporting period.  Patient continues to demonstrate the following deficits: decreased cardiorespiratory endurance, unbalanced muscle activation, decreased visual perceptual skills, decreased attention to right, decreased standing balance, decreased postural control, hemiplegia and decreased balance strategies and therefore will continue to benefit from skilled PT intervention to enhance overall performance with activity tolerance, balance, postural control, ability to compensate for deficits and functional use of  right upper extremity and right lower extremity.  Patient progressing toward long term goals..  Continue plan of care.  PT Short Term Goals Week 3:  PT Short Term Goal 1 (Week 3): Pt will consistently perform bed<>chair transfer with supervision. PT Short Term Goal 1 - Progress (Week 3): Met PT Short Term Goal 2 (Week 3): Pt will ambulate x100' in controlled environment with min A using LRAD. PT Short Term Goal 2 - Progress (Week 3): Met PT Short Term Goal 3 (Week 3): Pt will negotiate 3 steps with single hand rail and max A. PT Short Term Goal 3 - Progress (Week 3): Met Week 4:  PT Short Term Goal 1 (Week 4): STG's=LTG's secondary to ELOS  Skilled Therapeutic Interventions/Progress Updates:    Treatment Session 1:: Pt received semi-reclined in bed; agreeable to therapy. Session focused on increasing gait stability  in home environment.  Pt performed w/c mobility 1x130', 1x50' in controlled environment using bilat LE's with supervision, cueing for R-sided obstacle negotiation. Removed R hand orthosis from rolling walker secondary to pt-demonstrated ability to consistently maintain R grip on rolling walker. Gait x128' in controlled environment, x56' in home environment with rolling walker and min A, manual facilitation of L weight shift to promote RLE clearance. During seated rest break, performed manually-resisted eccentric R ankle plantar flexion to increase eccentric plantar flexion control, decrease anterior trunk lean, and minimize R genu recurvatum. Pt performed final gait trial x80' in controlled environment with rolling walker requiring min guard-min A, tactile cueing for R gluteus maximus activation, verbal cueing for upright posture. Therapist departed with pt semi-reclined in bed with 3 bed rails up, bed alarm on, and all needs within reach.  Treatment Session 2:: Pt received semi-reclined in bed; agreeable to therapy. Session focused on initiating floor transfer, fall recovery, and continued gait training with focus on controlling R genu recurvatum. Gait [phone] total (brief standing rest break after 140') in controlled environment with rolling walker, min A, manual facilitation at anterior R shoulder, posterior pelvis to promote upright posture, tactile cueing at R gluteus maximus to address R hip retraction.   In rehab apartment, explained fall recovery and demonstrated floor transfer using couch. Pt then performed floor transfer with min guard, min verbal cueing for hand placement, technique. Will re-attempt in future session. Furniture transfer with min guard, tactile cueing at R hand to facilitate utilization of RLE. Gait x80' in controlled environment with rolling walker and min A, cueing as described above; gait trial ended secondary to onset of R genu recurvatum during midstance (likely due  to pt fatigue).  Session ended in pt room, where pt was left semi-reclined in bed with 3 bed rails up, bed alarm on, and all needs within reach.  Therapy Documentation Precautions:  Precautions Precautions: Fall Precaution Comments: Right inattention Restrictions Weight Bearing Restrictions: No Vital Signs: Therapy Vitals Pulse Rate: 80 BP: 126/80 mmHg Pain:  Pt reports no pain during AM and PM physical therapy sessions.  See FIM for current functional status  Therapy/Group: Individual Therapy  Malva Cogan Francis Doenges 06/03/2013, 7:44 PM

## 2013-06-03 NOTE — Progress Notes (Signed)
Subjective/Complaints: occ gagging, worse at noc, and first thing in am  tolerating D 3 today Review of Systems - Negative except cough  Objective: Vital Signs: Blood pressure 106/72, pulse 65, temperature 97.5 F (36.4 C), temperature source Oral, resp. rate 18, weight 72.9 kg (160 lb 11.5 oz), SpO2 99.00%. No results found. Results for orders placed during the hospital encounter of 05/24/13 (from the past 72 hour(s))  URINALYSIS, ROUTINE W REFLEX MICROSCOPIC     Status: None   Collection Time    06/01/13  2:58 PM      Result Value Ref Range   Color, Urine YELLOW  YELLOW   APPearance CLEAR  CLEAR   Specific Gravity, Urine 1.014  1.005 - 1.030   pH 5.0  5.0 - 8.0   Glucose, UA NEGATIVE  NEGATIVE mg/dL   Hgb urine dipstick NEGATIVE  NEGATIVE   Bilirubin Urine NEGATIVE  NEGATIVE   Ketones, ur NEGATIVE  NEGATIVE mg/dL   Protein, ur NEGATIVE  NEGATIVE mg/dL   Urobilinogen, UA 0.2  0.0 - 1.0 mg/dL   Nitrite NEGATIVE  NEGATIVE   Leukocytes, UA NEGATIVE  NEGATIVE   Comment: MICROSCOPIC NOT DONE ON URINES WITH NEGATIVE PROTEIN, BLOOD, LEUKOCYTES, NITRITE, OR GLUCOSE <1000 mg/dL.     HEENT: normal Cardio: RRR and no murmur Resp: CTA B/L and unlabored GI: BS positive and unlabored Extremity:  Pulses positive and No Edema Skin:   Intact Neuro: Alert/Oriented, Cranial Nerve II-XII normal, Abnormal Motor 3- RUE,and RLE except 2- Left ankle, Tone:  Hypertonia and Dysarthric Musc/Skel:  Normal Gen NAD   Assessment/Plan: 1. Functional deficits secondary to right hemiparesis Left embolic MCA infarct which require 3+ hours per day of interdisciplinary therapy in a comprehensive inpatient rehab setting. Physiatrist is providing close team supervision and 24 hour management of active medical problems listed below. Physiatrist and rehab team continue to assess barriers to discharge/monitor patient progress toward functional and medical goals.  FIM: FIM - Bathing Bathing Steps Patient  Completed: Chest;Right Arm;Left Arm;Abdomen;Front perineal area;Buttocks;Right upper leg;Left upper leg;Right lower leg (including foot);Left lower leg (including foot) Bathing: 4: Steadying assist  FIM - Upper Body Dressing/Undressing Upper body dressing/undressing steps patient completed: Thread/unthread right sleeve of pullover shirt/dresss;Thread/unthread left sleeve of pullover shirt/dress;Put head through opening of pull over shirt/dress;Pull shirt over trunk Upper body dressing/undressing: 5: Set-up assist to: Obtain clothing/put away FIM - Lower Body Dressing/Undressing Lower body dressing/undressing steps patient completed: Thread/unthread right underwear leg;Thread/unthread left underwear leg;Pull underwear up/down;Thread/unthread right pants leg;Thread/unthread left pants leg;Pull pants up/down;Fasten/unfasten left shoe;Fasten/unfasten right shoe;Don/Doff right shoe;Don/Doff left shoe Lower body dressing/undressing: 4: Min-Patient completed 75 plus % of tasks  FIM - Toileting Toileting steps completed by patient: Performs perineal hygiene Toileting Assistive Devices: Grab bar or rail for support Toileting: 1: Total-Patient completed zero steps, helper did all 3  FIM - Radio producer Devices: Grab bars Toilet Transfers: 5-From toilet/BSC: Supervision (verbal cues/safety issues);5-Set-up assist to: Apply orthosis/W/C setup;5-To toilet/BSC: Supervision (verbal cues/safety issues)  FIM - Bed/Chair Transfer Bed/Chair Transfer Assistive Devices: Arm rests;Orthosis (R AFO) Bed/Chair Transfer: 6: Supine > Sit: No assist;4: Bed > Chair or W/C: Min A (steadying Pt. > 75%);4: Chair or W/C > Bed: Min A (steadying Pt. > 75%);6: Sit > Supine: No assist  FIM - Locomotion: Wheelchair Distance: 150 Locomotion: Wheelchair: 5: Travels 150 ft or more: maneuvers on rugs and over door sills with supervision, cueing or coaxing FIM - Locomotion: Ambulation Locomotion:  Ambulation Assistive Devices: Environmental consultant -  Rolling;Orthosis Ambulation/Gait Assistance: 4: Min assist Locomotion: Ambulation: 2: Travels 50 - 149 ft with minimal assistance (Pt.>75%)  Comprehension Comprehension Mode: Auditory Comprehension: 5-Understands basic 90% of the time/requires cueing < 10% of the time  Expression Expression Mode: Verbal Expression: 5-Expresses basic 90% of the time/requires cueing < 10% of the time.  Social Interaction Social Interaction: 6-Interacts appropriately with others with medication or extra time (anti-anxiety, antidepressant).  Problem Solving Problem Solving: 5-Solves complex 90% of the time/cues < 10% of the time  Memory Memory: 5-Recognizes or recalls 90% of the time/requires cueing < 10% of the time  Medical Problem List and Plan:  1. 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 Status post revascularization with clot retrieval  2. DVT Prophylaxis/Anticoagulation: SCDs.no signs of DVT , d/c SQ heparin 3. Pain Management: Tylenol as needed  4. Mood/anxiety: Xanax 0.5 mg tid prn,  Bed alarm for safety  5. Neuropsych: This patient is capable of making decisions on his own behalf.  6. Dysphagia. Dysphagia 2 thin liquids. Trial of D 3 today 7.Hypertension. Cozaar 25 mg daily, Lasix 20 mg daily, Aldactone 12.5 mg daily. Monitor with increased mobility  8. Cardiomyopathy/CHF. Monitor for any signs of fluid overload. Followup cardiology services. Cardiac cath clean, low EJ fx non ischemic 9.  Coughing with irritable throat- trial gabapentin, will try to avoid steroids and narcotic cough meds LOS (Days) 10 A FACE TO FACE EVALUATION WAS PERFORMED  Charlett Blake 06/03/2013, 7:59 AM

## 2013-06-03 NOTE — Progress Notes (Signed)
Speech Language Pathology Weekly Progress and Session Notes  Patient Details  Name: Isaac Johnson MRN: [id number] Date of Birth: 1966/02/02  Beginning of progress report period: May 27, 2013 End of progress report period: Jun 03, 2013  Today's Date: 06/03/2013 Time: 1130-1230 Time Calculation (min): 60 min  Short Term Goals: Week 3: SLP Short Term Goal 1 (Week 3): Pt. will demonstrate appropriate problem solving abilities during complex activities wtih supervision level cues SLP Short Term Goal 1 - Progress (Week 3): Met SLP Short Term Goal 2 (Week 3): Pt. will recall and utilize speech strategies in conversation with supervision verbal cues. SLP Short Term Goal 2 - Progress (Week 3): Progressing toward goal SLP Short Term Goal 3 (Week 3): Pt. will utilize memory strategies to recall important information with supervision level cues SLP Short Term Goal 3 - Progress (Week 3): Met    New Short Term Goals: Week 4: SLP Short Term Goal 1 (Week 4): Pt. will demonstrate appropriate problem solving abilities during complex activities wtih Mod I SLP Short Term Goal 2 (Week 4): Pt. will recall and utilize speech strategies in conversation with supervision verbal cues. SLP Short Term Goal 3 (Week 4): Pt. will utilize memory strategies to recall important information with Mod I SLP Short Term Goal 4 (Week 4): Pt will utilize safe swallowing strategies with current diet textures with Mod I  Weekly Progress Updates: Pt has met 2 out of 3 STGs during this reporting period due to improvements with complex problem solving and utilization of memory strategies. Pt has advanced his diet to Dys 3 textures and thin liquids, which he is consuming with intermittent supervision and set-up assist. Education is ongoing. Pt will benefit from continued SLP services to maximize functional independence, swallowing safety, and functional communication prior to discharge home with family.   Intensity: Minumum of 1-2  x/day, 30 to 90 minutes Frequency: 5 out of 7 days Duration/Length of Stay: 2 weeks Treatment/Interventions: Cognitive remediation/compensation;Patient/family education;Functional tasks;Dysphagia/aspiration precaution training   Daily Session Skilled Therapeutic Interventions: Skilled treatment focused on cognitive and swallowing goals. SLP facilitated session with time management task. Pt demonstrated anticipatory awareness by independently asking to turn the TV off prior to initiating treatment in order to maximize his attention. Pt completed daily scheduling task with supervision level cueing for complex problem solving, working memory, and organization of information. Pt was then observed throughout the beginning of his lunch meal, consisting of Dys 3 textures and thin liquids via cup sips. He utilized his safe swallowing strategies with Mod I, with no overt s/s of aspiration observed.        FIM:  Comprehension Comprehension Mode: Auditory Comprehension: 5-Understands complex 90% of the time/Cues < 10% of the time Expression Expression Mode: Verbal Expression: 5-Expresses basic 90% of the time/requires cueing < 10% of the time. Social Interaction Social Interaction: 6-Interacts appropriately with others with medication or extra time (anti-anxiety, antidepressant). Problem Solving Problem Solving: 5-Solves complex 90% of the time/cues < 10% of the time Memory Memory: 5-Recognizes or recalls 90% of the time/requires cueing < 10% of the time FIM - Eating Eating Activity: 6: Modified consistency diet: (comment);6: Swallowing techniques: self-managed;6: More than reasonable amount of time (dys 3) General    Pain  No/denies pain  Therapy/Group: Individual Therapy   Germain Osgood, M.A. CCC-SLP [phone]  Germain Osgood 06/03/2013, 1:21 PM

## 2013-06-04 ENCOUNTER — Inpatient Hospital Stay (HOSPITAL_COMMUNITY): Payer: BC Managed Care – PPO | Admitting: Occupational Therapy

## 2013-06-04 DIAGNOSIS — I69991 Dysphagia following unspecified cerebrovascular disease: Secondary | ICD-10-CM

## 2013-06-04 DIAGNOSIS — I69959 Hemiplegia and hemiparesis following unspecified cerebrovascular disease affecting unspecified side: Secondary | ICD-10-CM

## 2013-06-04 DIAGNOSIS — I6350 Cerebral infarction due to unspecified occlusion or stenosis of unspecified cerebral artery: Secondary | ICD-10-CM

## 2013-06-04 DIAGNOSIS — I69922 Dysarthria following unspecified cerebrovascular disease: Secondary | ICD-10-CM

## 2013-06-04 NOTE — Progress Notes (Signed)
Subjective/Complaints: occ gagging, worse at noc, and first thing in am  tolerating D 3 today Review of Systems - Negative except cough  Objective: Vital Signs: Blood pressure 109/66, pulse 80, temperature 97.4 F (36.3 C), temperature source Axillary, resp. rate 18, weight 72.9 kg (160 lb 11.5 oz), SpO2 98.00%. No results found. Results for orders placed during the hospital encounter of 05/24/13 (from the past 72 hour(s))  URINALYSIS, ROUTINE W REFLEX MICROSCOPIC     Status: None   Collection Time    06/01/13  2:58 PM      Result Value Ref Range   Color, Urine YELLOW  YELLOW   APPearance CLEAR  CLEAR   Specific Gravity, Urine 1.014  1.005 - 1.030   pH 5.0  5.0 - 8.0   Glucose, UA NEGATIVE  NEGATIVE mg/dL   Hgb urine dipstick NEGATIVE  NEGATIVE   Bilirubin Urine NEGATIVE  NEGATIVE   Ketones, ur NEGATIVE  NEGATIVE mg/dL   Protein, ur NEGATIVE  NEGATIVE mg/dL   Urobilinogen, UA 0.2  0.0 - 1.0 mg/dL   Nitrite NEGATIVE  NEGATIVE   Leukocytes, UA NEGATIVE  NEGATIVE   Comment: MICROSCOPIC NOT DONE ON URINES WITH NEGATIVE PROTEIN, BLOOD, LEUKOCYTES, NITRITE, OR GLUCOSE <1000 mg/dL.     HEENT: normal Cardio: RRR and no murmur Resp: CTA B/L and unlabored GI: BS positive and unlabored Extremity:  Pulses positive and No Edema Skin:   Intact Neuro: Alert/Oriented, Cranial Nerve II-XII normal, Abnormal Motor 3- RUE,and RLE except 2- Left ankle, Tone:  Hypertonia and Dysarthric Musc/Skel:  Normal Gen NAD   Assessment/Plan: 1. Functional deficits secondary to right hemiparesis Left embolic MCA infarct which require 3+ hours per day of interdisciplinary therapy in a comprehensive inpatient rehab setting. Physiatrist is providing close team supervision and 24 hour management of active medical problems listed below. Physiatrist and rehab team continue to assess barriers to discharge/monitor patient progress toward functional and medical goals.  FIM: FIM - Bathing Bathing Steps  Patient Completed: Chest;Right Arm;Left Arm;Abdomen;Front perineal area;Buttocks;Right upper leg;Left upper leg;Right lower leg (including foot);Left lower leg (including foot) Bathing: 4: Steadying assist  FIM - Upper Body Dressing/Undressing Upper body dressing/undressing steps patient completed: Thread/unthread right sleeve of pullover shirt/dresss;Thread/unthread left sleeve of pullover shirt/dress;Put head through opening of pull over shirt/dress;Pull shirt over trunk Upper body dressing/undressing: 5: Set-up assist to: Obtain clothing/put away FIM - Lower Body Dressing/Undressing Lower body dressing/undressing steps patient completed: Thread/unthread right underwear leg;Thread/unthread left underwear leg;Pull underwear up/down;Thread/unthread right pants leg;Thread/unthread left pants leg;Pull pants up/down;Fasten/unfasten left shoe;Fasten/unfasten right shoe;Don/Doff right shoe;Don/Doff left shoe Lower body dressing/undressing: 4: Min-Patient completed 75 plus % of tasks  FIM - Toileting Toileting steps completed by patient: Performs perineal hygiene Toileting Assistive Devices: Grab bar or rail for support Toileting: 1: Total-Patient completed zero steps, helper did all 3  FIM - Radio producer Devices: Grab bars Toilet Transfers: 0-Activity did not occur  FIM - Control and instrumentation engineer Devices: Arm rests;Walker Bed/Chair Transfer: 6: Supine > Sit: No assist;4: Bed > Chair or W/C: Min A (steadying Pt. > 75%);4: Chair or W/C > Bed: Min A (steadying Pt. > 75%);6: Sit > Supine: No assist  FIM - Locomotion: Wheelchair Distance: 1x130, 1x50 Locomotion: Wheelchair: 2: Travels 50 - 149 ft with supervision, cueing or coaxing FIM - Locomotion: Ambulation Locomotion: Ambulation Assistive Devices: Administrator Ambulation/Gait Assistance: 4: Min assist Locomotion: Ambulation: 2: Travels 50 - 149 ft with minimal assistance  (Pt.>75%)  Comprehension  Comprehension Mode: Auditory Comprehension: 5-Understands complex 90% of the time/Cues < 10% of the time  Expression Expression Mode: Verbal Expression: 5-Expresses basic needs/ideas: With no assist  Social Interaction Social Interaction: 6-Interacts appropriately with others with medication or extra time (anti-anxiety, antidepressant).  Problem Solving Problem Solving: 5-Solves complex 90% of the time/cues < 10% of the time  Memory Memory: 5-Recognizes or recalls 90% of the time/requires cueing < 10% of the time  Medical Problem List and Plan:  1. 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 Status post revascularization with clot retrieval  2. DVT Prophylaxis/Anticoagulation: SCDs.no signs of DVT , d/c SQ heparin 3. Pain Management: Tylenol as needed  4. Mood/anxiety: Xanax 0.5 mg tid prn,  Bed alarm for safety  5. Neuropsych: This patient is capable of making decisions on his own behalf.  6. Dysphagia. Dysphagia 2 thin liquids. Trial of D 3 today 7.Hypertension. Cozaar 25 mg daily, Lasix 20 mg daily, Aldactone 12.5 mg daily. Monitor with increased mobility  8. Cardiomyopathy/CHF. Monitor for any signs of fluid overload. Followup cardiology services. Cardiac cath clean, low EJ fx non ischemic 9.  Coughing with irritable throat- improved on gabapentin, may need daytime dose, will try to avoid steroids and narcotic cough meds LOS (Days) 11 A FACE TO FACE EVALUATION WAS PERFORMED  Charlett Blake 06/04/2013, 9:05 AM

## 2013-06-04 NOTE — Progress Notes (Signed)
Occupational Therapy Session Note  Patient Details  Name: Isaac Johnson MRN: [id number] Date of Birth: 09/08/1965  Today's Date: 06/04/2013 Time: 1400-1500 Time Calculation (min): 60 min  Skilled Therapeutic Interventions/Progress Updates: Patient seen in neuro UE group with focus on R UE and LE weight bearing and using right UE to play connect 4 to further increase right UE skills and to increase dynamic and static standing balance.     Therapy Documentation Precautions:  Precautions Precautions: Fall Precaution Comments: Right inattention Restrictions Weight Bearing Restrictions: No  Pain: denied    See FIM for current functional status  Therapy/Group: Group Therapy  Herschell Dimes 06/04/2013, 4:59 PM

## 2013-06-05 ENCOUNTER — Inpatient Hospital Stay (HOSPITAL_COMMUNITY): Payer: BC Managed Care – PPO | Admitting: Occupational Therapy

## 2013-06-05 NOTE — Progress Notes (Signed)
Subjective/Complaints: Gagging /coughing improved  tolerating D 3 today Review of Systems - Negative except cough  Objective: Vital Signs: Blood pressure 104/70, pulse 73, temperature 97.3 F (36.3 C), temperature source Oral, resp. rate 18, weight 72.9 kg (160 lb 11.5 oz), SpO2 100.00%. No results found. No results found for this or any previous visit (from the past 72 hour(s)).   HEENT: normal Cardio: RRR and no murmur Resp: CTA B/L and unlabored GI: BS positive and unlabored Extremity:  Pulses positive and No Edema Skin:   Intact Neuro: Alert/Oriented, Cranial Nerve II-XII normal, Abnormal Motor 3 RUE,and RLE except 2- Left ankle, Tone:  Hypertonia and Dysarthric Musc/Skel:  Normal Gen NAD   Assessment/Plan: 1. Functional deficits secondary to right hemiparesis Left embolic MCA infarct which require 3+ hours per day of interdisciplinary therapy in a comprehensive inpatient rehab setting. Physiatrist is providing close team supervision and 24 hour management of active medical problems listed below. Physiatrist and rehab team continue to assess barriers to discharge/monitor patient progress toward functional and medical goals.  FIM: FIM - Bathing Bathing Steps Patient Completed: Chest;Right Arm;Left Arm;Abdomen;Front perineal area;Buttocks;Right upper leg;Left upper leg;Right lower leg (including foot);Left lower leg (including foot) Bathing: 4: Steadying assist  FIM - Upper Body Dressing/Undressing Upper body dressing/undressing steps patient completed: Thread/unthread right sleeve of pullover shirt/dresss;Thread/unthread left sleeve of pullover shirt/dress;Put head through opening of pull over shirt/dress;Pull shirt over trunk Upper body dressing/undressing: 5: Set-up assist to: Obtain clothing/put away FIM - Lower Body Dressing/Undressing Lower body dressing/undressing steps patient completed: Thread/unthread right underwear leg;Thread/unthread left underwear leg;Pull  underwear up/down;Thread/unthread right pants leg;Thread/unthread left pants leg;Pull pants up/down;Fasten/unfasten left shoe;Fasten/unfasten right shoe;Don/Doff right shoe;Don/Doff left shoe Lower body dressing/undressing: 4: Min-Patient completed 75 plus % of tasks  FIM - Toileting Toileting steps completed by patient: Performs perineal hygiene Toileting Assistive Devices: Grab bar or rail for support Toileting: 1: Total-Patient completed zero steps, helper did all 3  FIM - Radio producer Devices: Grab bars Toilet Transfers: 0-Activity did not occur  FIM - Control and instrumentation engineer Devices: Arm rests;Walker Bed/Chair Transfer: 6: Supine > Sit: No assist;4: Bed > Chair or W/C: Min A (steadying Pt. > 75%);4: Chair or W/C > Bed: Min A (steadying Pt. > 75%);6: Sit > Supine: No assist  FIM - Locomotion: Wheelchair Distance: 1x130, 1x50 Locomotion: Wheelchair: 2: Travels 50 - 149 ft with supervision, cueing or coaxing FIM - Locomotion: Ambulation Locomotion: Ambulation Assistive Devices: Administrator Ambulation/Gait Assistance: 4: Min assist Locomotion: Ambulation: 2: Travels 50 - 149 ft with minimal assistance (Pt.>75%)  Comprehension Comprehension Mode: Auditory Comprehension: 5-Understands complex 90% of the time/Cues < 10% of the time  Expression Expression Mode: Verbal Expression: 5-Expresses complex 90% of the time/cues < 10% of the time  Social Interaction Social Interaction: 6-Interacts appropriately with others with medication or extra time (anti-anxiety, antidepressant).  Problem Solving Problem Solving: 5-Solves complex 90% of the time/cues < 10% of the time  Memory Memory: 6-More than reasonable amt of time  Medical Problem List and Plan:  1. 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 Status post revascularization with clot retrieval  2. DVT  Prophylaxis/Anticoagulation: SCDs.no signs of DVT , d/c SQ heparin 3. Pain Management: Tylenol as needed  4. Mood/anxiety: Xanax 0.5 mg tid prn,  Bed alarm for safety  5. Neuropsych: This patient is capable of making decisions on his own behalf.  6. Dysphagia. Dysphagia 2 thin liquids. Trial of  D 3 today 7.Hypertension. Cozaar 25 mg daily, Lasix 20 mg daily, Aldactone 12.5 mg daily. Monitor with increased mobility  8. Cardiomyopathy/CHF. Monitor for any signs of fluid overload. Followup cardiology services. Cardiac cath clean, low EJ fx non ischemic 9.  Coughing with irritable throat- improved on gabapentin,not needing daytime dose, will try to avoid steroids and narcotic cough meds LOS (Days) 12 A FACE TO FACE EVALUATION WAS PERFORMED  Charlett Blake 06/05/2013, 9:25 AM

## 2013-06-05 NOTE — Progress Notes (Signed)
Occupational Therapy Session Note  Patient Details  Name: CONNELLY NETTERVILLE MRN: [id number] Date of Birth: 10-16-1965  Today's Date: 06/05/2013 Time: 1130-1215 Time Calculation (min): 45 min   Skilled Therapeutic Interventions/Progress Updates: patient received skilled OT via R UE neuro re-education,seated and dynamically standing EOB to increase R UE use for self care and other activities.     Patient c/o what he called "I've had that Sciatic pain in that left leg for a while now.  It acts up every now and then."    Therapy Documentation Precautions:  Precautions Precautions: Fall Precaution Comments: Right inattention Restrictions Weight Bearing Restrictions: No   Pain: "sciatic pain" in left leg but patient denied need for meds or anything else to address the pain   See FIM for current functional status  Therapy/Group: Individual Therapy  Herschell Dimes 06/05/2013, 2:53 PM

## 2013-06-06 ENCOUNTER — Inpatient Hospital Stay (HOSPITAL_COMMUNITY): Payer: BC Managed Care – PPO | Admitting: Physical Therapy

## 2013-06-06 ENCOUNTER — Encounter (HOSPITAL_COMMUNITY): Payer: BC Managed Care – PPO | Admitting: Occupational Therapy

## 2013-06-06 ENCOUNTER — Ambulatory Visit (HOSPITAL_COMMUNITY): Payer: BC Managed Care – PPO | Admitting: Physical Therapy

## 2013-06-06 ENCOUNTER — Inpatient Hospital Stay (HOSPITAL_COMMUNITY): Payer: BC Managed Care – PPO | Admitting: Speech Pathology

## 2013-06-06 DIAGNOSIS — I69991 Dysphagia following unspecified cerebrovascular disease: Secondary | ICD-10-CM

## 2013-06-06 DIAGNOSIS — I69959 Hemiplegia and hemiparesis following unspecified cerebrovascular disease affecting unspecified side: Secondary | ICD-10-CM

## 2013-06-06 DIAGNOSIS — I6350 Cerebral infarction due to unspecified occlusion or stenosis of unspecified cerebral artery: Secondary | ICD-10-CM

## 2013-06-06 DIAGNOSIS — I69922 Dysarthria following unspecified cerebrovascular disease: Secondary | ICD-10-CM

## 2013-06-06 NOTE — Progress Notes (Signed)
Speech Language Pathology Daily Session Note  Patient Details  Name: Isaac Johnson MRN: [id number] Date of Birth: 11/12/1965  Today's Date: 06/06/2013 Time: 0814-4818 Time Calculation (min): 55 min  Short Term Goals: Week 4: SLP Short Term Goal 1 (Week 4): Pt. will demonstrate appropriate problem solving abilities during complex activities wtih Mod I SLP Short Term Goal 2 (Week 4): Pt. will recall and utilize speech strategies in conversation with supervision verbal cues. SLP Short Term Goal 3 (Week 4): Pt. will utilize memory strategies to recall important information with Mod I SLP Short Term Goal 4 (Week 4): Pt will utilize safe swallowing strategies with current diet textures with Mod I  Skilled Therapeutic Interventions: Skilled treatment session focused on addressing dysphagia goals and family education.  SLP facilitated session with set-up of lunch.  Patient consumed Dys.3 textures and thin liquids via cup with Mod I for portion control and pacing.  SLP also facilitated session with handout information for soft solids and provided verbal education/home recommendations for complex problem solving with patient requiring Supervision assist.  Patient declined trials of regular textures today but agreed to try tomorrow.     FIM:  Comprehension Comprehension Mode: Auditory Comprehension: 6-Follows complex conversation/direction: With extra time/assistive device Expression Expression Mode: Verbal Expression: 5-Expresses complex 90% of the time/cues < 10% of the time Social Interaction Social Interaction: 6-Interacts appropriately with others with medication or extra time (anti-anxiety, antidepressant). Problem Solving Problem Solving: 6-Solves complex problems: With extra time Memory Memory: 5-Requires cues to use assistive device FIM - Eating Eating Activity: 6: Modified consistency diet: (comment);6: Swallowing techniques: self-managed (Dys3)  Pain Pain Assessment Pain Assessment:  No/denies pain  Therapy/Group: Individual Therapy  Carmelia Roller., [place]  [name] 06/06/2013, 4:25 PM

## 2013-06-06 NOTE — Progress Notes (Signed)
Social Work Patient ID: Isaac Johnson, male   DOB: [birth year], 48 y.o.   MRN: [id number] Pt requires a lightweight wheelchair due to he is unable to self propel a standard weight wheelchair.  He also uses for self care tasks.

## 2013-06-06 NOTE — Progress Notes (Signed)
Orthopedic Tech Progress Note Patient Details:  Isaac Johnson 05-07-1965 [id number]  Patient ID: Isaac Johnson, male   DOB: [birth year], 48 y.o.   MRN: [id number] Brace order completed by [place] 06/06/2013, 9:31 PM

## 2013-06-06 NOTE — Progress Notes (Addendum)
Physical Therapy Session Note  Patient Details  Name: Isaac Johnson MRN: [id number] Date of Birth: 1965-10-06  Today's Date: 06/06/2013 Time: 5784-6962 and 9528-4132 Time Calculation (min): 29 min and 57 min  Short Term Goals: Week 4:  PT Short Term Goal 1 (Week 4): STG's=LTG's secondary to ELOS  Skilled Therapeutic Interventions/Progress Updates:    Treatment Session 1: Pt received semi-reclined in bed; agreeable to therapy. No AFO donned. Session focused on continued family training. Transported pt in w/c to car, where PT explained safe technique for providing supervision, cueing with car transfer. Pt then performed stand pivot transfer from w/c<>car with rolling walker and supervision, cueing of wife. Pt performed gait x110' in controlled environment with rolling walker and close supervision of wife. R genu recurvatum noted during gait trial. Session ended in pt room, where pt was left seated in w/c with wife present and all needs within reach.  Wife provided safe supervision and appropriate cueing with basic transfers, car transfer, bed mobility, and gait. Wife expressing uncertainty as to whether pt will be able to safely negotiate stone path (steppers) to reach 3 stairs with 2 rails. Wife suggesting further training focus on negotiation of 3 steps without rails to enable pt to enter/exit home through [place]. Due to time constraint of current session, therapist scheduled wife to return for final family training session on 5/6, per wife work schedule.   Treatment Session 2: Pt received semi-reclined in bed; agreeable to therapy. Session focused on increasing stability/independence with community gait, addressing wife request of practicing negotiation of 3 steps without rails. Seated EOB, assisted pt with donning shoes, R AFO (Blue Rocker), and R heel lift to address limited R ankle dorsiflexion and R genu recurvatum during standing activity and gait. Pt performed gait x180' in controlled  environment with rolling walker and supervision, cueing for upright posture, forward gaze. In gym, negotiated 3 stairs x2 trials without rails with min A, R HHA, forward-facing with step-to pattern, min cueing to increase R hip/knee flexion for RLE clearance when ascending. Transported pt to outside hospital lobby, where pt performed w/c mobility x150' in community environment with LUE and bilat LE's with supervision. Gait x150' in community environment (sidewalk, inclined/declined) with rolling walker and supervision; no overt LOB.   Returned to rehab unit, where remainder of session focused on assessing/addressing pt need for R AFO. Orthotist Gerald Stabs, this therapist, and pt engaged in interactive discussion of pt impairments and least restrictive AFO to facilitate safe mobility. All in agreement that R Blue Rocker AFO is most appropriate option. After donning personal R AFO and R heel lift, pt performed gait x140' in controlled environment with rolling walker and supervision with no episodes of R genu recurvatum and consistent adequate R toe clearance during RLE swing phase of gait. Session ended in pt room, where pt was left semi-reclined in bed with 3 bed rails up, bed alarm on, and all needs with reach.  Addendum: Long term goal added for floor transfer to address fall recovery.  Therapy Documentation Precautions:  Precautions Precautions: Fall Precaution Comments: Right inattention Restrictions Weight Bearing Restrictions: No Vital Signs: Therapy Vitals Temp: 98.2 F (36.8 C) Temp src: Oral Pulse Rate: 82 Resp: 18 BP: 134/80 mmHg Patient Position, if appropriate: Lying Oxygen Therapy SpO2: 99 % O2 Device: None (Room air) Pain: Pain Assessment Pain Assessment: No/denies pain Locomotion : Ambulation Ambulation/Gait Assistance: 5: Supervision Wheelchair Mobility Distance: 150   See FIM for current functional status  Therapy/Group: Individual Therapy  Benjie Karvonen  A [place] 06/06/2013,  4:29 PM

## 2013-06-06 NOTE — Progress Notes (Signed)
Occupational Therapy Session Note  Patient Details  Name: Isaac Johnson MRN: [id number] Date of Birth: 04/01/1965  Today's Date: 06/06/2013 Time: 1005-1101 Time Calculation (min): 56 min  Short Term Goals: Week 2:  OT Short Term Goal 1 (Week 2): RUE:  Patient will position and monitor RUE at all times during BADL and functional mobility with no more than 4 vcs per session OT Short Term Goal 1 - Progress (Week 2): Met OT Short Term Goal 2 (Week 2): Patient will complete 3/3 toilet tasks with steadying assist only OT Short Term Goal 2 - Progress (Week 2): Met OT Short Term Goal 3 (Week 2): Patient will complete LB dressing with min assist  OT Short Term Goal 3 - Progress (Week 2): Met OT Short Term Goal 4 (Week 2): Patient will initiate use of RUE as stabilizer during self-care task with no cues OT Short Term Goal 4 - Progress (Week 2): Met  Skilled Therapeutic Interventions/Progress Updates:   Patient scheduled this am for family education.  Wife running late.  Fabricated right wrist orthosis to promote better alignment of carpal bones both against metacarpals for hand function, and against forearm for arm function. Patient has shown a great deal of improvement in his ability to direct his care related to basic self care skills.  When wife arrived, patient able to explain steps of tub transfer, and then demonstrate safely with her.  Patient and wife work very well together to coordinate his care.  Wife works in an assisted [place] comfortable with basic transfers and mobility.  Wife has seen patient dress himself, and this was not practiced today.  Patient requires only occasional assistance to dress himself, and wife showed capability with like components in toileting tasks.  Discussed recommendation for tub transfer bench and bedside commode.  Also shared desire for patient to transition to OP OT after CIR discharge. 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  Patient and wife both agreeable to this concept.    Therapy Documentation Precautions:  Precautions Precautions: Fall Precaution Comments: Right inattention Restrictions Weight Bearing Restrictions: No   Pain: Pain Assessment Pain Assessment: No/denies pain  See FIM for current functional status  Therapy/Group: Individual Therapy  Mariah Milling 06/06/2013, 12:51 PM

## 2013-06-06 NOTE — Progress Notes (Signed)
Subjective/Complaints: Gagging /coughing improved Sleeping well More talkative tolerating D 3 today Review of Systems - Negative except cough  Objective: Vital Signs: Blood pressure 104/65, pulse 67, temperature 97.7 F (36.5 C), temperature source Oral, resp. rate 20, weight 72.9 kg (160 lb 11.5 oz), SpO2 98.00%. No results found. No results found for this or any previous visit (from the past 72 hour(s)).   HEENT: normal Cardio: RRR and no murmur Resp: CTA B/L and unlabored GI: BS positive and non tender Extremity:  Pulses positive and No Edema Skin:   Intact Neuro: Alert/Oriented, Cranial Nerve II-XII normal, Abnormal Motor 3 RUE,and RLE except 2- Left ankle, Tone:  Hypertonia and Dysarthric Musc/Skel:  Normal Gen NAD   Assessment/Plan: 1. Functional deficits secondary to right hemiparesis Left embolic MCA infarct which require 3+ hours per day of interdisciplinary therapy in a comprehensive inpatient rehab setting. Physiatrist is providing close team supervision and 24 hour management of active medical problems listed below. Physiatrist and rehab team continue to assess barriers to discharge/monitor patient progress toward functional and medical goals.  FIM: FIM - Bathing Bathing Steps Patient Completed: Chest;Right Arm;Left Arm;Abdomen;Front perineal area;Buttocks;Right upper leg;Left upper leg;Right lower leg (including foot);Left lower leg (including foot) Bathing: 4: Steadying assist  FIM - Upper Body Dressing/Undressing Upper body dressing/undressing steps patient completed: Thread/unthread right sleeve of pullover shirt/dresss;Thread/unthread left sleeve of pullover shirt/dress;Put head through opening of pull over shirt/dress;Pull shirt over trunk Upper body dressing/undressing: 5: Set-up assist to: Obtain clothing/put away FIM - Lower Body Dressing/Undressing Lower body dressing/undressing steps patient completed: Thread/unthread right underwear  leg;Thread/unthread left underwear leg;Pull underwear up/down;Thread/unthread right pants leg;Thread/unthread left pants leg;Pull pants up/down;Fasten/unfasten left shoe;Fasten/unfasten right shoe;Don/Doff right shoe;Don/Doff left shoe Lower body dressing/undressing: 4: Min-Patient completed 75 plus % of tasks  FIM - Toileting Toileting steps completed by patient: Performs perineal hygiene Toileting Assistive Devices: Grab bar or rail for support Toileting: 1: Total-Patient completed zero steps, helper did all 3  FIM - Radio producer Devices: Grab bars Toilet Transfers: 0-Activity did not occur  FIM - Control and instrumentation engineer Devices: Arm rests;Walker Bed/Chair Transfer: 6: Supine > Sit: No assist;4: Bed > Chair or W/C: Min A (steadying Pt. > 75%);4: Chair or W/C > Bed: Min A (steadying Pt. > 75%);6: Sit > Supine: No assist  FIM - Locomotion: Wheelchair Distance: 1x130, 1x50 Locomotion: Wheelchair: 2: Travels 50 - 149 ft with supervision, cueing or coaxing FIM - Locomotion: Ambulation Locomotion: Ambulation Assistive Devices: Administrator Ambulation/Gait Assistance: 4: Min assist Locomotion: Ambulation: 2: Travels 50 - 149 ft with minimal assistance (Pt.>75%)  Comprehension Comprehension Mode: Auditory Comprehension: 5-Understands complex 90% of the time/Cues < 10% of the time  Expression Expression Mode: Verbal Expression: 5-Expresses complex 90% of the time/cues < 10% of the time  Social Interaction Social Interaction: 6-Interacts appropriately with others with medication or extra time (anti-anxiety, antidepressant).  Problem Solving Problem Solving: 5-Solves complex 90% of the time/cues < 10% of the time  Memory Memory: 6-More than reasonable amt of time  Medical Problem List and Plan:  1. 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 Status post revascularization with clot  retrieval  2. DVT Prophylaxis/Anticoagulation: SCDs.no signs of DVT , d/c SQ heparin 3. Pain Management: Tylenol as needed  4. Mood/anxiety: Xanax 0.5 mg tid prn,  Bed alarm for safety  5. Neuropsych: This patient is capable of making decisions on his own behalf.  6. Dysphagia. Dysphagia 2  thin liquids. Trial of D 3 today 7.Hypertension. Cozaar 25 mg daily, Lasix 20 mg daily, Aldactone 12.5 mg daily. Monitor with increased mobility  8. Cardiomyopathy/CHF. Monitor for any signs of fluid overload. Followup cardiology services. Cardiac cath clean, low EJ fx non ischemic 9.  Coughing with irritable throat- improved on gabapentin,not needing daytime dose, LOS (Days) 13 A FACE TO FACE EVALUATION WAS PERFORMED  Charlett Blake 06/06/2013, 8:32 AM

## 2013-06-06 NOTE — Progress Notes (Signed)
Orthopedic Tech Progress Note Patient Details:  Isaac Johnson Jun 11, 1965 [id number] [facility] called to ensure they received brace order Patient ID: Isaac Johnson, male   DOB: [birth year], 48 y.o.   MRN: [id number]   Fenton Foy 06/06/2013, 12:04 PM

## 2013-06-06 NOTE — Progress Notes (Signed)
Social Work Patient ID: Isaac Johnson, male   DOB: Oct 13, 1965, 48 y.o.   MRN: [id number] Met with pt and wife when here for family education today, wife feels he is doing well and she is ready for him to come home. She plans to come in at 10;00 on Wd to complete PT training due to not all completed today.  Both aware pt requires 24 hr supervision level and are agreeable to OP therapies at [place].  Will work toward discharge on Wed.

## 2013-06-07 ENCOUNTER — Inpatient Hospital Stay (HOSPITAL_COMMUNITY): Payer: BC Managed Care – PPO | Admitting: Physical Therapy

## 2013-06-07 ENCOUNTER — Encounter (HOSPITAL_COMMUNITY): Payer: BC Managed Care – PPO | Admitting: Occupational Therapy

## 2013-06-07 ENCOUNTER — Inpatient Hospital Stay (HOSPITAL_COMMUNITY): Payer: BC Managed Care – PPO | Admitting: Occupational Therapy

## 2013-06-07 ENCOUNTER — Inpatient Hospital Stay (HOSPITAL_COMMUNITY): Payer: BC Managed Care – PPO

## 2013-06-07 DIAGNOSIS — I69991 Dysphagia following unspecified cerebrovascular disease: Secondary | ICD-10-CM

## 2013-06-07 DIAGNOSIS — I69959 Hemiplegia and hemiparesis following unspecified cerebrovascular disease affecting unspecified side: Secondary | ICD-10-CM

## 2013-06-07 DIAGNOSIS — I69922 Dysarthria following unspecified cerebrovascular disease: Secondary | ICD-10-CM

## 2013-06-07 DIAGNOSIS — I6350 Cerebral infarction due to unspecified occlusion or stenosis of unspecified cerebral artery: Secondary | ICD-10-CM

## 2013-06-07 MED ORDER — GABAPENTIN 300 MG PO CAPS: 300.0000 mg | ORAL_CAPSULE | Freq: Every day | ORAL | Status: AC

## 2013-06-07 MED ORDER — LOSARTAN POTASSIUM 25 MG PO TABS: 25.0000 mg | ORAL_TABLET | Freq: Two times a day (BID) | ORAL | Status: DC

## 2013-06-07 MED ORDER — ALPRAZOLAM 0.5 MG PO TABS: 0.5000 mg | ORAL_TABLET | Freq: Three times a day (TID) | ORAL | Status: DC | PRN

## 2013-06-07 MED ORDER — SPIRONOLACTONE 12.5 MG HALF TABLET: 12.5000 mg | ORAL_TABLET | Freq: Every day | ORAL | Status: DC

## 2013-06-07 MED ORDER — HYDROCHLOROTHIAZIDE 12.5 MG PO CAPS: 12.5000 mg | ORAL_CAPSULE | Freq: Every day | ORAL | Status: DC

## 2013-06-07 MED ORDER — CLOPIDOGREL BISULFATE 75 MG PO TABS: 75.0000 mg | ORAL_TABLET | Freq: Every day | ORAL | Status: DC

## 2013-06-07 MED ORDER — CARVEDILOL 3.125 MG PO TABS: 3.1250 mg | ORAL_TABLET | Freq: Two times a day (BID) | ORAL | Status: DC

## 2013-06-07 MED ORDER — ASPIRIN 81 MG PO TBEC: 81.0000 mg | DELAYED_RELEASE_TABLET | Freq: Every day | ORAL | Status: AC

## 2013-06-07 MED ORDER — NITROGLYCERIN 0.4 MG SL SUBL: 0.4000 mg | SUBLINGUAL_TABLET | SUBLINGUAL | Status: AC | PRN

## 2013-06-07 MED ORDER — OMEPRAZOLE 20 MG PO CPDR: 20.0000 mg | DELAYED_RELEASE_CAPSULE | Freq: Every day | ORAL | Status: AC

## 2013-06-07 MED ORDER — SUCRALFATE 1 G PO TABS
1.0000 g | ORAL_TABLET | Freq: Two times a day (BID) | ORAL | Status: DC
  Administered 2013-06-07 – 2013-06-08 (×2): 1 g via ORAL
  Filled 2013-06-07 (×5): qty 1

## 2013-06-07 MED ORDER — FUROSEMIDE 40 MG PO TABS: 40.0000 mg | ORAL_TABLET | Freq: Every day | ORAL | Status: AC

## 2013-06-07 MED ORDER — SUCRALFATE 1 G PO TABS: 1.0000 g | ORAL_TABLET | Freq: Two times a day (BID) | ORAL | Status: AC

## 2013-06-07 MED ORDER — LORATADINE 10 MG PO TABS: 10.0000 mg | ORAL_TABLET | Freq: Every day | ORAL | Status: DC

## 2013-06-07 MED ORDER — ZOLPIDEM TARTRATE 5 MG PO TABS: 5.0000 mg | ORAL_TABLET | Freq: Every evening | ORAL | Status: DC | PRN

## 2013-06-07 MED ORDER — TRAMADOL HCL 50 MG PO TABS: 50.0000 mg | ORAL_TABLET | Freq: Four times a day (QID) | ORAL | Status: DC | PRN

## 2013-06-07 MED ORDER — POTASSIUM CHLORIDE CRYS ER 20 MEQ PO TBCR: 40.0000 meq | EXTENDED_RELEASE_TABLET | Freq: Every day | ORAL | Status: AC

## 2013-06-07 MED ORDER — CITALOPRAM HYDROBROMIDE 10 MG PO TABS: 10.0000 mg | ORAL_TABLET | Freq: Every day | ORAL | Status: AC

## 2013-06-07 NOTE — Progress Notes (Signed)
Physical Therapy Discharge Summary  Patient Details  Name: AUREN VALDES MRN: [id number] Date of Birth: [birth year]  Today's Date: 06/09/2013 Time: 1000-1030 and 1107-1130 Time Calculation (min): 30 min and 23 min  Patient has met 10 of 10 long term goals due to improved activity tolerance, improved balance, improved postural control, increased strength, ability to compensate for deficits, functional use of  right upper extremity and right lower extremity and improved coordination.  Patient to discharge at an ambulatory level Supervision.   Patient's care partner is independent to provide the necessary physical assistance at discharge.  Reasons goals not met: N/A; all goals met or surpassed.  Recommendation:  Patient will benefit from ongoing skilled PT services in outpatient setting to continue to advance safe functional mobility, address ongoing impairments in stability/independence with functional mobility, and minimize fall risk.  Equipment: w/c (18"x18") and cushion; rolling walker; right ankle-foot orthotic  Reasons for discharge: treatment goals met and discharge from hospital  Patient/family agrees with progress made and goals achieved: Yes  Skilled Therapeutic Interventions/Progress Updates: Session 1: Pt received semi-reclined in bed; agreeable to therapy. Session scheduled for completion of hands-on family training of negotiation of 3 stairs without rails, per wife request due to concern of inability to access entrance with stairs with 2 hand rails rails. However, wife not present for session. Therefore, session focused on practicing stair negotiation technique to further prepare for hands-on training upon arrival of wife. Seated EOB, pt effectively donned R AFO and shoes without assist. Multiple gait trials> 150' in controlled environment with rolling walker and supervision; no overt LOB. Performed multiple trials of negotiation of 3 stairs without rails with min A, L HHA,  forward-facing with step-to pattern. Pt performed stand pivot transfer from w/c<> car simulator with rolling walker and mod I. Session ended in pt room, where pt was left semi reclined in bed with 3 bed rails up and all needs within reach.  Session 2: Unscheduled session with focus on completion of hands-on family training due to wife absence during scheduled session. Therapist explained, demonstrated technique for providing min A, L HHA with negotiation of 3 stairs as described above. Wife performed x4 trials requiring cueing during initial 2 trials. Wife gave effective return demonstration of proper technique and cueing during final 2 stair negotiation trials. Wife notified PT during this session that pt must negotiate additional step without rails to enter doorway of home. Therefore, this PT simulated step and demonstrated technique for providing supervision, cueing and stabilization of rolling walker for negotiation of step. Wife performed x4 trials, demonstrating safe/proper technique and appropriate cueing during final 2 trials without cueing from this PT. Per wife request, patient, wife, and PT engaged in interactive discussion of step-by-step plan (including placement of rolling walker at top of stairs prior to initiating stair negotiation) for mobility required to safely transition from car<>home. Wife and pt express feeling prepared for all functional mobility upon discharge home.     PT Discharge Precautions/Restrictions Precautions Precautions: Fall Precaution Comments: Right inattention Restrictions Weight Bearing Restrictions: No Pain Pain Assessment Pain Assessment: No/denies pain Cognition Overall Cognitive Status: Impaired/Different from baseline Arousal/Alertness: Awake/alert Orientation Level: Oriented X4 Attention: Selective Selective Attention: Appears intact Memory: Impaired Memory Impairment: Decreased recall of new information;Retrieval deficit Awareness: Appears  intact Problem Solving: Impaired Problem Solving Impairment: Verbal complex;Functional complex Executive Function: Self Monitoring;Initiating;Sequencing Sequencing: Appears intact Initiating: Appears intact Self Monitoring: Appears intact Safety/Judgment: Appears intact Sensation Sensation Light Touch: Appears Intact (in bilat LE's) Stereognosis: Impaired Detail  Stereognosis Impaired Details: Impaired RUE Hot/Cold: Not tested Proprioception: Appears Intact (in bilat LE's) Coordination Gross Motor Movements are Fluid and Coordinated: No Fine Motor Movements are Fluid and Coordinated: No Coordination and Movement Description: Pt is currently Brunnstrum stage IV-V in the right arm and hand with functional tasks.  He can oppose his thumb to the second and third digits but not proficient with touching the 4th and 5th.  He can use the RUE as a non-dominan level but needs cueing to incorporate it.  Can now tie his shoes with increased time as well as use for removing bottle lids.  Heel Shin Test: RLE with decreased coordination, as compared with LLE. Motor  Motor Motor: Abnormal postural alignment and control;Hemiplegia Motor - Discharge Observations: Pt still with mild right hemiparesis in the UE and LE.   Mobility Bed Mobility Bed Mobility: Supine to Sit;Sit to Supine Supine to Sit: 6: Modified independent (Device/Increase time);HOB flat Sit to Supine: 6: Modified independent (Device/Increase time);HOB flat Transfers Transfers: Yes Sit to Stand: 5: Supervision;From bed;With upper extremity assist Sit to Stand Details: Verbal cues for technique;Tactile cues for weight beaing Stand to Sit: 5: Supervision;To chair/3-in-1;With upper extremity assist Stand to Sit Details (indicate cue type and reason): Verbal cues for precautions/safety Locomotion  Ambulation Ambulation: Yes Ambulation/Gait Assistance: 5: Supervision Ambulation Distance (Feet): 230 Feet Assistive device: Rolling  walker;Other (Comment) (R AFO) Gait Gait: Yes Gait Pattern: Step-through pattern;Right flexed knee in stance;Lateral trunk lean to left;Trunk rotated posteriorly on right Stairs / Additional Locomotion Stairs: Yes Stairs Assistance: 5: Supervision Stair Management Technique: Two rails;Step to pattern;Forwards Number of Stairs: 15 Ramp: 5: Supervision (with rolling walker, R  AFO) Curb: 5: Supervision (with rolling walker, R  AFO) Wheelchair Mobility Wheelchair Mobility: Yes Wheelchair Assistance: 5: Careers information officer: Both lower extermities Wheelchair Parts Management: Independent Distance: 190  Trunk/Postural Assessment  Cervical Assessment Cervical Assessment: Within Functional Limits Thoracic Assessment Thoracic Assessment: Within Functional Limits Lumbar Assessment Lumbar Assessment: Within Functional Limits Postural Control Postural Control: Within Functional Limits  Balance Balance Balance Assessed: Yes Static Sitting Balance Static Sitting - Balance Support: Left upper extremity supported;Feet supported Static Sitting - Level of Assistance: 7: Independent Dynamic Sitting Balance Dynamic Sitting - Balance Support: No upper extremity supported Dynamic Sitting - Level of Assistance: 5: Stand by assistance Dynamic Sitting - Balance Activities: Reaching across midline;Lateral lean/weight shifting;Forward lean/weight shifting;Reaching for objects Static Standing Balance Static Standing - Balance Support: No upper extremity supported Static Standing - Level of Assistance: 5: Stand by assistance Dynamic Standing Balance Dynamic Standing - Balance Support: No upper extremity supported Dynamic Standing - Level of Assistance: 5: Stand by assistance Extremity Assessment  RLE Assessment RLE Assessment: Exceptions to [facility] RLE Strength RLE Overall Strength: Deficits Right Hip Flexion: 4/5 Right Knee Flexion: 4/5 Right Knee Extension: 5/5 Right Ankle  Dorsiflexion: 3-/5 Right Ankle Plantar Flexion: 3+/5 LLE Assessment LLE Assessment: Within Functional Limits  See FIM for current functional status  Benjie Karvonen A Jaela Yepez 06/09/2013, 9:24 PM

## 2013-06-07 NOTE — Discharge Instructions (Signed)
Inpatient Rehab Discharge Instructions  Isaac Johnson Discharge date and time: No discharge date for patient encounter.   Activities/Precautions/ Functional Status: Activity: activity as tolerated Diet: soft Wound Care: none needed Functional status:  ___ No restrictions     ___ Walk up steps independently _x__ 24/7 supervision/assistance   ___ Walk up steps with assistance ___ Intermittent supervision/assistance  ___ Bathe/dress independently ___ Walk with walker     ___ Bathe/dress with assistance ___ Walk Independently    ___ Shower independently _x STROKE/TIA DISCHARGE INSTRUCTIONS SMOKING Cigarette smoking nearly doubles your risk of having a stroke & is the single most alterable risk factor  If you smoke or have smoked in the last 12 months, you are advised to quit smoking for your health.  Most of the excess cardiovascular risk related to smoking disappears within a year of stopping.  Ask you doctor about anti-smoking medications  Isaac Johnson Quit Line: 1-800-QUIT NOW  Free Smoking Cessation Classes [phone]  CHOLESTEROL Know your levels; limit fat & cholesterol in your diet  Lipid Panel     Component Value Date/Time   CHOL 124 05/06/2013 0520   TRIG 63 05/06/2013 0520   HDL 37* 05/06/2013 0520   CHOLHDL 3.4 05/06/2013 0520   VLDL 13 05/06/2013 0520   LDLCALC 74 05/06/2013 0520      Many patients benefit from treatment even if their cholesterol is at goal.  Goal: Total Cholesterol (CHOL) less than 160  Goal:  Triglycerides (TRIG) less than 150  Goal:  HDL greater than 40  Goal:  LDL (LDLCALC) less than 100   BLOOD PRESSURE American Stroke Association blood pressure target is less that 120/80 mm/Hg  Your discharge blood pressure is:  BP: 111/73 mmHg  Monitor your blood pressure  Limit your salt and alcohol intake  Many individuals will require more than one medication for high blood pressure  DIABETES (A1c is a blood sugar average for last 3 months) Goal HGBA1c is under  7% (HBGA1c is blood sugar average for last 3 months)  Diabetes: No known diagnosis of diabetes    Lab Results  Component Value Date   HGBA1C 5.8* 05/06/2013     Your HGBA1c can be lowered with medications, healthy diet, and exercise.  Check your blood sugar as directed by your physician  Call your physician if you experience unexplained or low blood sugars.  PHYSICAL ACTIVITY/REHABILITATION Goal is 30 minutes at least 4 days per week  Activity: Increase activity slowly, Therapies: Physical Therapy: Home Health Return to work:   Activity decreases your risk of heart attack and stroke and makes your heart stronger.  It helps control your weight and blood pressure; helps you relax and can improve your mood.  Participate in a regular exercise program.  Talk with your doctor about the best form of exercise for you (dancing, walking, swimming, cycling).  DIET/WEIGHT Goal is to maintain a healthy weight  Your discharge diet is: Dysphagia  liquids Your height is:    Your current weight is: Weight: 72.9 kg (160 lb 11.5 oz) Your Body Mass Index (BMI) is:     Following the type of diet specifically designed for you will help prevent another stroke.  Your goal weight range is:    Your goal Body Mass Index (BMI) is 19-24.  Healthy food habits can help reduce 3 risk factors for stroke:  High cholesterol, hypertension, and excess weight.  RESOURCES Stroke/Support Group:  Call Isaac Johnson  PATIENT Stroke warning signs and symptoms How to activate emergency medical system (call 911). Medications prescribed at discharge. Need for follow-up after discharge. Personal risk factors for stroke. Pneumonia vaccine given:  Flu vaccine given:  My questions have been answered, the writing is legible, and I understand these instructions.  I will adhere to these goals & educational materials that have been provided to me after my discharge from the  hospital.    __ Walk with assistance    ___ Shower with assistance ___ No alcohol     ___ Return to work/school ________  Special Instructions:    COMMUNITY REFERRALS UPON DISCHARGE:    Outpatient: PT, OT, SPT  Agency:[facility]  [phone]    Date of Last Service:06/08/2013  Appointment Date/Time:MAY 7-Friday 9;45-12;00    Medical Equipment/Items Ordered:WHEELCHAIR,ROLLING WALKER, 3 IN 1, TUB BENCH  Agency/Supplier:[facility]   [phone]   GENERAL COMMUNITY RESOURCES FOR PATIENT/FAMILY: Support Groups:[facility] SUPPORT GROUP   My questions have been answered and I understand these instructions. I will adhere to these goals and the provided educational materials after my discharge from the hospital.  Patient/Caregiver Signature _______________________________ Date __________  Clinician Signature _______________________________________ Date __________  Please bring this form and your medication list with you to all your follow-up doctor's appointments.

## 2013-06-07 NOTE — Progress Notes (Signed)
Speech Language Pathology Discharge Summary & Final Treatment Note  Patient Details  Name: Isaac Johnson MRN: [id number] Date of Birth: 1965/08/24  Today's Date: 06/07/2013 Time: 1130-1230 Time Calculation (min): 60 min  Skilled Therapeutic Interventions:  Skilled treatment focused on education related to swallowing and cognitive goals. SLP facilitated session with observation of lunch meal consisting of regular textures and thin liquids via straw. Pt consumed meal with Mod I with no overt s/s of aspiration. Pt participated in functional conversation related to current level of function as well as d/c planning and recommendations with no cues provided for speech intelligibility.    Patient has met 4 of 4 long term goals.  Patient to discharge at overall Supervision level.  Reasons goals not met: N/A   Clinical Impression/Discharge Summary: Pt has met 4 out of 4 LTGs during this admission due to improvements with speech intelligibility, complex problem solving, recall of new information, and oropharyngeal swallowing function. Pt is discharging home with 24/7 support from wife at an overall supervision level for complex cognitive tasks. He is Mod I for use of speech and swallowing strategies with a regular diet and thin liquids. Pt will benefit from continued OP SLP services to maximize functional communication and independence.  Care Partner:  Caregiver Able to Provide Assistance: Yes  Type of Caregiver Assistance: Cognitive  Recommendation:  Outpatient SLP;Other (comment) (inermittent supervision (during complex tasks))  Rationale for SLP Follow Up: Maximize functional communication;Maximize cognitive function and independence   Equipment: N/A   Reasons for discharge: Discharged from hospital;Treatment goals met   Patient/Family Agrees with Progress Made and Goals Achieved: Yes   See FIM for current functional status   Germain Osgood, M.A. CCC-SLP [phone]  Germain Osgood 06/07/2013, 12:55 PM

## 2013-06-07 NOTE — Discharge Summary (Signed)
Isaac Johnson, Isaac Johnson                  ACCOUNT NO.:  [id number]  MEDICAL RECORD NO.:  [id number]  LOCATION:  4W07C                        FACILITY:  [place]  PHYSICIAN:  Lauraine Rinne, P.A.  DATE OF BIRTH:  [birth year]  DATE OF ADMISSION:  05/24/2013 DATE OF DISCHARGE:  06/08/2013                              DISCHARGE SUMMARY   DISCHARGE DIAGNOSES: 1. 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 2. Sequential compression devices for deep venous thrombosis     prophylaxis. 3. Pain management. 4. Anxiety. 5. Dysphagia, resolved. 6. Hypertension. 7. Cardiomyopathy with congestive heart failure.  HISTORY OF PRESENT ILLNESS:  This is a 48 year old right-handed male with history of congestive heart failure, brain lipoma admitted to outside hospital on May 06, 2013, with right-sided weakness.  He was treated with tPA, transferred to [facility] for further treatment.  CT of the head negative for acute changes.  Echocardiogram with ejection fraction 25% with diffuse hypokinesis with grade 2 diastolic dysfunction.  Carotid Dopplers without ICA stenosis.  The patient improved after tPA.  On May 07, 2013, he developed recurrent dense right-sided weakness.  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  He underwent cerebral angiogram with revascularization and clot retrieval by Interventional Radiology.  He was evaluated by Cardiology Services who recommend heart catheterization prior to discharge to rule out CAD and expressed concerns of CVA could be cardioembolic due to low ejection fraction.  He remained on aspirin and Plavix for secondary stroke prevention.  The patient  was admitted for comprehensive rehab program.  PAST MEDICAL HISTORY:  See discharge diagnoses.  SOCIAL HISTORY:  Lives with spouse.  FUNCTIONAL HISTORY PRIOR TO ADMISSION:  Independent.  FUNCTIONAL STATUS UPON ADMISSION TO REHAB SERVICES:  Min mod assist for functional mobility, min mod assist activities of daily living.  PHYSICAL EXAMINATION:  VITAL SIGNS:  Blood pressure 122/85, pulse 79, temperature 98.7 respirations 18. GENERAL:  This was an alert male, poor awareness of posture with tendency to slump to the left.  Speech was soft but clear. LUNGS:  Clear to auscultation. CARDIAC:  Regular rate and rhythm. ABDOMEN:  Soft, nontender.  Good bowel sounds.  REHABILITATION HOSPITAL COURSE:  The patient was admitted to inpatient rehab services with therapies initiated on a 3-hour daily basis consisting of physical therapy, occupational therapy, speech therapy, and rehabilitation nursing.  The following issues were addressed during the patient's rehabilitation stay.  Pertaining to Mr. Bartolomei multiple infarcts, left MCA distribution remained stable.  He had undergone revascularization with clot retrieval and remained on aspirin and Plavix therapy.  He would follow up with Neurology Services.  Sequential compression device in place for DVT prophylaxis, no signs of DVT.  He was using Xanax on as needed basis for anxiety with good results.  A bed alarm also in place for the patient's safety due to some mild limited awareness of his deficits.  His diet  had been advanced to a mechanical soft, which he was tolerating well, showing no signs of aspiration. Blood pressures monitored and followed closely by Cardiology Services. Noted cardiomyopathy and congestive heart failure.  No signs of fluid overload.  He had undergone cardiac catheterization during his hospital stay that was unremarkable.  He would need close monitoring of his blood pressure at time of discharge.  He exhibited no signs of  fluid overload with diuretic ongoing.  The patient received weekly collaborative interdisciplinary team conferences to discuss estimated length of stay, family teaching, and any barriers to discharge.  He ambulated 110 feet in a controlled environment with a rolling walker in close supervision. Wife provided safe supervision and she underwent full family teaching as well as monitoring for basic transfers, car transfers, bed mobility. Activities of daily living, again family teaching ongoing.  The patient and wife were able to explain steps of tub transfers and demonstrate safely.  The patient required only occasional assistance to dress himself and wife showed capability with components in toileting tasks. Followup speech therapy for mechanical soft diet which the patient tolerated well and family again received full teaching in regard to this.  It was expressed the need for ongoing 24 hour care for the patient's safety which wife could provide.  Ongoing therapies were arranged as per rehab services.  DISCHARGE MEDICATIONS:  Included 1. Xanax 0.5 mg p.o. t.i.d. as needed. 2. Aspirin 81 mg p.o. daily. 3. Coreg 3.125 mg p.o. b.i.d. 4. Celexa 10 mg p.o. daily. 5. Plavix 75 mg p.o. daily. 6. Flonase 1 spray each nostril daily. 7. Lasix 40 mg p.o. daily. 8. Neurontin 300 mg p.o. at bedtime. 9. Hydrochlorothiazide 12.5 mg p.o. daily. 10.Claritin 10 mg p.o. bedtime. 11.Cozaar 25 mg p.o. b.i.d. 12.Nitroglycerin 0.4 mg sublingual every 5 minutes x3 as needed chest     pain. 13.Protonix 80 mg p.o. at bedtime. 14.Potassium chloride 40 mEq p.o. daily. 15.Aldactone 12.5 mg p.o. daily. 16.Carafate 1 g p.o. b.i.d. 17.Ultram 50 mg p.o. every 6 hours as needed moderate pain.  DIET:  Mechanical soft, thin liquids.  SPECIAL INSTRUCTIONS:  The patient would follow up with Dr. Alysia Penna at the [facility] on July 15, 2013; Dr. Antony Contras in 1 month, call for appointment;   Cardiology services, Dr. Irish Lack, call for appointment; primary MD Dr. Telford Nab, medical management appointment to be made.  Ongoing therapies had been arranged.     Lauraine Rinne, P.A.     DA/MEDQ  D:  06/07/2013  T:  06/07/2013  Job:  [id number]  cc:   Jettie Booze, MD Pramod P. Leonie Man, MD Stoney Bang, MD

## 2013-06-07 NOTE — Progress Notes (Signed)
Physical Therapy Session Note  Patient Details  Name: Isaac Johnson MRN: [id number] Date of Birth: Feb 07, 1965  Today's Date: 06/07/2013 Time: 8299-3716 Time Calculation (min): 44 min  Short Term Goals: Week 4:  PT Short Term Goal 1 (Week 4): STG's=LTG's secondary to ELOS  Skilled Therapeutic Interventions/Progress Updates:    Pt received in gym accompanied by OT; seated EOM having just finished OT session. Pt agreeable to therapy. Pt wearing R AFO. Session focused on assessing/addressing stability/independence with functional mobility. Negotiated 15 stairs with bilat rails, forward-facing with step-to pattern and supervision, min verbal cueing for sequencing with effective return demonstration. Pt performed gait x175', x230' in controlled and home environments with rolling walker and supervision; no overt LOB. W/c mobility x190' in controlled and home environments with bilat LE's requiring supervision, subtle cueing for R-sided obstacle negotiation. In rehab apartment, performed supine<>sit with HOB flat, no rail with mod I and increased time. Performed furniture transfer (sit<>stand from couch) with rolling walker and supervision. Floor transfer with supervision; min verbal cueing for technique for transition form seated on couch>tall kneeling on floor. Session ended in pt room, where pt was left semi-reclined in bed with 3 bed rails up, bed alarm on, and all needs within reach.  Therapy Documentation Precautions:  Precautions Precautions: Fall Precaution Comments: Right inattention Restrictions Weight Bearing Restrictions: No Pain: Pain Assessment Pain Assessment: No/denies pain Locomotion : Stairs / Additional Locomotion Stairs: Yes Stairs Assistance: 5: Supervision Stairs Assistance Details: Verbal cues for sequencing Stair Management Technique: Two rails;Step to pattern;Forwards Number of Stairs: 15 Ramp: 5: Supervision (with rolling walker, R AFO) Curb: 5: Supervision (with  rolling walker, R AFO)   See FIM for current functional status  Therapy/Group: Individual Therapy  Malva Cogan Raine Blodgett 06/07/2013, 2:13 PM

## 2013-06-07 NOTE — Progress Notes (Addendum)
Subjective/Complaints: Sleeping well Feels ready for D/C in am tolerating D 3  Review of Systems - Negative except cough  Objective: Vital Signs: Blood pressure 116/79, pulse 68, temperature 97.3 F (36.3 C), temperature source Oral, resp. rate 18, weight 72.9 kg (160 lb 11.5 oz), SpO2 100.00%. No results found. No results found for this or any previous visit (from the past 72 hour(s)).   HEENT: normal Cardio: RRR and no murmur Resp: CTA B/L and unlabored GI: BS positive and unlabored Extremity:  Pulses positive and No Edema Skin:   Intact Neuro: Alert/Oriented, Cranial Nerve II-XII normal, Abnormal Motor 3 RUE,and RLE except 2- Left ankle, Tone:  Hypertonia and Dysarthric Musc/Skel:  Normal Gen NAD   Assessment/Plan: 1. Functional deficits secondary to right hemiparesis Left embolic MCA infarct which require 3+ hours per day of interdisciplinary therapy in a comprehensive inpatient rehab setting. Physiatrist is providing close team supervision and 24 hour management of active medical problems listed below. Physiatrist and rehab team continue to assess barriers to discharge/monitor patient progress toward functional and medical goals. Wife needs addnl caregiver training Plan D/C in am FIM: FIM - Bathing Bathing Steps Patient Completed: Chest;Right Arm;Left Arm;Abdomen;Front perineal area;Buttocks;Right upper leg;Left upper leg;Right lower leg (including foot);Left lower leg (including foot) Bathing: 4: Steadying assist  FIM - Upper Body Dressing/Undressing Upper body dressing/undressing steps patient completed: Thread/unthread right sleeve of pullover shirt/dresss;Thread/unthread left sleeve of pullover shirt/dress;Put head through opening of pull over shirt/dress;Pull shirt over trunk Upper body dressing/undressing: 5: Set-up assist to: Obtain clothing/put away FIM - Lower Body Dressing/Undressing Lower body dressing/undressing steps patient completed: Thread/unthread  right underwear leg;Thread/unthread left underwear leg;Pull underwear up/down;Thread/unthread right pants leg;Thread/unthread left pants leg;Pull pants up/down;Fasten/unfasten left shoe;Fasten/unfasten right shoe;Don/Doff right shoe;Don/Doff left shoe Lower body dressing/undressing: 4: Min-Patient completed 75 plus % of tasks  FIM - Toileting Toileting steps completed by patient: Performs perineal hygiene Toileting Assistive Devices: Grab bar or rail for support Toileting: 1: Total-Patient completed zero steps, helper did all 3  FIM - Radio producer Devices: Grab bars Toilet Transfers: 0-Activity did not occur  FIM - Control and instrumentation engineer Devices: Arm rests;Walker;Orthosis (R AFO) Bed/Chair Transfer: 5: Bed > Chair or W/C: Supervision (verbal cues/safety issues);5: Chair or W/C > Bed: Supervision (verbal cues/safety issues)  FIM - Locomotion: Wheelchair Distance: 150 Locomotion: Wheelchair: 5: Travels 150 ft or more: maneuvers on rugs and over door sills with supervision, cueing or coaxing FIM - Locomotion: Ambulation Locomotion: Ambulation Assistive Devices: Walker - Rolling;Orthosis (R AFO) Ambulation/Gait Assistance: 5: Supervision Locomotion: Ambulation: 2: Travels 50 - 149 ft with supervision/safety issues  Comprehension Comprehension Mode: Auditory Comprehension: 6-Follows complex conversation/direction: With extra time/assistive device  Expression Expression Mode: Verbal Expression: 5-Expresses complex 90% of the time/cues < 10% of the time  Social Interaction Social Interaction: 6-Interacts appropriately with others with medication or extra time (anti-anxiety, antidepressant).  Problem Solving Problem Solving: 6-Solves complex problems: With extra time  Memory Memory: 5-Requires cues to use assistive device  Medical Problem List and Plan:  1. Multiple infarcts in the left MCA distribution secondary to large  vessel atherosclerosis. Status post revascularization with clot retrieval  2. DVT Prophylaxis/Anticoagulation: SCDs.no signs of DVT , d/c SQ heparin 3. Pain Management: Tylenol as needed  4. Mood/anxiety: Xanax 0.5 mg tid prn,  Bed alarm for safety  5. Neuropsych: This patient is capable of making decisions on his own behalf.  6. Dysphagia.  thin liquids. Tolerating  D3 7.Hypertension. Cozaar 25  mg daily, Lasix 20 mg daily, Aldactone 12.5 mg daily. Monitor with increased mobility  8. Cardiomyopathy/CHF. Monitor for any signs of fluid overload. Followup cardiology services. Cardiac cath clean, low EJ fx non ischemic 9.  Coughing with irritable throat- improved on gabapentin,not needing daytime dose, will try to avoid steroids and narcotic cough meds LOS (Days) 14 A FACE TO FACE EVALUATION WAS PERFORMED  Charlett Blake 06/07/2013, 7:07 AM

## 2013-06-07 NOTE — Progress Notes (Signed)
Occupational Therapy Session Note  Patient Details  Name: ZOE NORDIN MRN: [id number] Date of Birth: 20-Aug-1965  Today's Date: 06/07/2013 Time: 0800-0900 Time Calculation (min): 60 min  Short Term Goals: Week 3:  OT Short Term Goal 1 (Week 3): Focus on LTGs  Skilled Therapeutic Interventions/Progress Updates:    Pt performed bathing and dressing during session sit to stand at the sink per his choice.  He was able to transfer to the wheelchair with supervision stand pivot using the RW for support.  He was able to perform all bathing with supervision sit to stand but continues to need mod instructional cueing to use the RUE more.  Dressing tasks also supervision level sit to stand as well.  Pt able to donn his AFO and shoes as well as tie them with increased time so shoe buttons removed.  When finished educated pt on FM and gross motor coordination exercises that could be performed during down time.  Issued foam sponge cut into pieces to work on pinch as well as in hand manipulation.  Also provided washcloth with instruction for pt to work on washing the bedside table for increased shoulder strength and weightbearing.    Therapy Documentation Precautions:  Precautions Precautions: Fall Precaution Comments: Right inattention Restrictions Weight Bearing Restrictions: No  Pain: Pain Assessment Pain Assessment: No/denies pain ADL: See FIM for current functional status  Therapy/Group: Individual Therapy  Cindra Presume OTR/L 06/07/2013, 9:09 AM

## 2013-06-07 NOTE — Discharge Summary (Signed)
Discharge summary job [id number]

## 2013-06-07 NOTE — Progress Notes (Signed)
Occupational Therapy Discharge Summary  Patient Details  Name: Isaac Johnson MRN: [id number] Date of Birth: 1965/08/23  Today's Date: 06/07/2013 Time: 9628-3662 Time Calculation (min): 46 min  Session Note:  Pt ambulated down to the OT gym with supervision using the RW for support.  Demonstrates decreased postural alignment with mobility with increased elevation in the left shoulder and depression and lateral flexion in the right shoulder.  Pt also exhibits increased scapular winging at the medial inferior border.  Had pt work on quadriped to start for increased weightbearing in the [place].  He demonstrates increased difficulty maintaining right elbow extension when attempting to reach with the LUE and stabilize his right arm on the mat.  Pt reporting increased pain in his wrist when attempting those so transitioned to supine with min assist.  Vanduser ball for bilateral shoulder flexion and extension movements with emphasis on maintaining right elbow extension.  Also had pt work on this in sitting but demonstrated increased difficulty maintaining shoulder flexion above 90 degrees for more than a few seconds. Finished session by having pt work on picking up medium/large pegs from a bowl and placing them in the wooded grid at knee level.  He needed mod instructional cueing and min assist to achieve full elbow extension before reaching forward at his trunk.    Patient has met 8 of 8 long term goals due to improved activity tolerance, improved balance, ability to compensate for deficits, functional use of  RIGHT upper and RIGHT lower extremity, improved awareness and improved coordination.  Patient to discharge at overall Supervision level.  Patient's care partner is independent to provide the necessary physical and cognitive assistance at discharge.    Reasons goals not met: NA  Recommendation:  Patient will benefit from ongoing skilled OT services in outpatient setting to continue to advance  functional skills in the area of BADL.  Pt currently still needs supervision for safety and balance with selfcare tasks.  In addition, he also still demonstrates decreased coordination and strength in the RUE and RLE that impacts his functional abilities.  Feel he needs continued outpatient OT to help maximize current progress with the RUE and help to reach a modified independent level.  Pt will have initial 24 hour supervision from his wife and she has been educated on safe techniques to assist him during transfers and selfcare tasks..    Equipment: 3:1, tub/shower bench, wheelchair  Reasons for discharge: treatment goals met and discharge from hospital  Patient/family agrees with progress made and goals achieved: Yes  OT Discharge Precautions/Restrictions  Precautions Precautions: Fall Precaution Comments: Right inattention Restrictions Weight Bearing Restrictions: No  Pain Pain Assessment Pain Assessment: No/denies pain ADL  See FIM scale  Vision/Perception  Vision- History Baseline Vision/History: Wears glasses Wears Glasses: At all times Patient Visual Report: No change from baseline Vision- Assessment Vision Assessment?: No apparent visual deficits Visual Fields: No apparent deficits Perception Comments: Pt needs mod instructional cueing to position the RUE with transitional movements as well as to use it more in functional context.   Cognition Overall Cognitive Status: Impaired/Different from baseline Arousal/Alertness: Awake/alert Orientation Level: Oriented X4 Attention: Sustained;Selective;Alternating Sustained Attention: Appears intact Selective Attention: Appears intact Alternating Attention: Impaired Alternating Attention Impairment: Functional complex;Functional basic Memory: Impaired Memory Impairment: Retrieval deficit;Decreased short term memory Awareness: Appears intact Safety/Judgment: Appears intact Sensation Sensation Light Touch: Appears  Intact Stereognosis: Impaired Detail Stereognosis Impaired Details: Impaired RUE Hot/Cold: Not tested Proprioception: Appears Intact Coordination Gross Motor Movements are Fluid and  Coordinated: No Fine Motor Movements are Fluid and Coordinated: No Coordination and Movement Description: Pt is currently Brunnstrum stage IV-V in the right arm and hand with functional tasks.  He can oppose his thumb to the second and third digits but not proficient with touching the 4th and 5th.  He can use the RUE as a non-dominan level but needs cuding to incorporate it.  Can now tie his shoes with increased time as well as use for removing bottle lids.  Motor  Motor Motor: Abnormal postural alignment and control;Hemiplegia Motor - Discharge Observations: Pt still with mild right hemiparesis in the UE and LE.  Mobility  Bed Mobility Bed Mobility: Supine to Sit Supine to Sit: 6: Modified independent (Device/Increase time) Transfers Transfers: Sit to Stand Sit to Stand: 5: Supervision;From bed;With upper extremity assist Sit to Stand Details: Verbal cues for technique;Tactile cues for weight beaing Sit to Stand Details (indicate cue type and reason): Pt needs mod instructional cueing to use the RUE to assist with sit to stand. Stand to Sit: 5: Supervision;To chair/3-in-1;With upper extremity assist Stand to Sit Details (indicate cue type and reason): Verbal cues for precautions/safety  Trunk/Postural Assessment  Cervical Assessment Cervical Assessment: Within Functional Limits Thoracic Assessment Thoracic Assessment: Within Functional Limits Lumbar Assessment Lumbar Assessment: Within Functional Limits  Balance Balance Balance Assessed: Yes Static Sitting Balance Static Sitting - Balance Support: Left upper extremity supported;Feet supported Static Sitting - Level of Assistance: 7: Independent Dynamic Sitting Balance Dynamic Sitting - Balance Support: No upper extremity supported Dynamic Sitting -  Level of Assistance: 5: Stand by assistance Dynamic Sitting - Balance Activities: Reaching across midline;Lateral lean/weight shifting;Forward lean/weight shifting;Reaching for objects Static Standing Balance Static Standing - Balance Support: No upper extremity supported Static Standing - Level of Assistance: 5: Stand by assistance Dynamic Standing Balance Dynamic Standing - Balance Support: No upper extremity supported Dynamic Standing - Level of Assistance: 5: Stand by assistance Extremity/Trunk Assessment RUE Assessment RUE Assessment: Exceptions to [facility] RUE AROM (degrees) Overall AROM Right Upper Extremity: Deficits RUE Strength RUE Overall Strength Comments: Pt is currently Brunnstrum stage IV-V in the right arm and hand.  He still demonstrates slight synergy pattern with shoulder flexion movements as well as decreased right wrist control with FM tasks.  He is able to opose his thumb to the 2nd and 3rd digit proficiently but not the 4th and 5th.      See FIM for current functional status  Cindra Presume OTR/L 06/07/2013, 3:20 PM

## 2013-06-08 ENCOUNTER — Inpatient Hospital Stay (HOSPITAL_COMMUNITY): Payer: BC Managed Care – PPO | Admitting: Physical Therapy

## 2013-06-08 ENCOUNTER — Inpatient Hospital Stay (HOSPITAL_COMMUNITY): Payer: BC Managed Care – PPO | Admitting: Occupational Therapy

## 2013-06-08 DIAGNOSIS — I69991 Dysphagia following unspecified cerebrovascular disease: Secondary | ICD-10-CM

## 2013-06-08 DIAGNOSIS — I6350 Cerebral infarction due to unspecified occlusion or stenosis of unspecified cerebral artery: Secondary | ICD-10-CM

## 2013-06-08 DIAGNOSIS — I69959 Hemiplegia and hemiparesis following unspecified cerebrovascular disease affecting unspecified side: Secondary | ICD-10-CM

## 2013-06-08 DIAGNOSIS — I69922 Dysarthria following unspecified cerebrovascular disease: Secondary | ICD-10-CM

## 2013-06-08 NOTE — Progress Notes (Addendum)
Occupational Therapy Session Note  Patient Details  Name: Isaac Johnson MRN: [id number] Date of Birth: 03-09-1965  Today's Date: 06/08/2013 Time: 1130/1145  Time Calculation (min): 15 min  Short Term Goals:  Week 3:  OT Short Term Goal 1 (Week 3): Focus on LTGs  Skilled Therapeutic Interventions/Progress Updates:    1:1 Splint adjustment made around bony prominences along radius and ulna with heat gun for more proper fit. Also provided 2 stockings as skin protectant. Reviewed with wife and pt wearing times, importance of frequent skin checks and promote use of right UE in many  functional tasks throughout his day including grooming, eating and using the RW.   Therapy Documentation Precautions:  Precautions Precautions: Fall Precaution Comments: Right inattention Restrictions Weight Bearing Restrictions: No Pain:  no c/o pain   See FIM for current functional status  Therapy/Group: Individual Therapy  Merrilee Seashore 06/08/2013, 2:34 PM

## 2013-06-08 NOTE — Progress Notes (Signed)
Patient and spouse received discharge instructions from Dan Angiulli, PA-C with verbal understanding. Patient discharged to home with spouse and belongings. 

## 2013-06-08 NOTE — Progress Notes (Signed)
Subjective/Complaints: Sleeping well No cough and gagging Review of Systems - R side weak  Objective: Vital Signs: Blood pressure 117/74, pulse 72, temperature 97.6 F (36.4 C), temperature source Oral, resp. rate 17, weight 72.9 kg (160 lb 11.5 oz), SpO2 99.00%. No results found. No results found for this or any previous visit (from the past 72 hour(s)).   HEENT: normal Cardio: RRR and no murmur Resp: CTA B/L and unlabored GI: BS positive and unlabored Extremity:  Pulses positive and No Edema Skin:   Intact Neuro: Alert/Oriented, Cranial Nerve II-XII normal, Abnormal Motor 3 RUE,and RLE except 2- Left ankle, Tone:  Hypertonia and Dysarthric Musc/Skel:  Normal Gen NAD   Assessment/Plan: 1. Functional deficits secondary to right hemiparesis Left embolic MCA infarct  Stable for D/C today F/u PCP in 1-2 weeks F/u PM&R 3 weeks F/u cardiology See D/C summary See D/C instructions  FIM: FIM - Bathing Bathing Steps Patient Completed: Chest;Right Arm;Left Arm;Abdomen;Front perineal area;Buttocks;Right upper leg;Left upper leg;Right lower leg (including foot);Left lower leg (including foot) Bathing: 5: Supervision: Safety issues/verbal cues  FIM - Upper Body Dressing/Undressing Upper body dressing/undressing steps patient completed: Thread/unthread right sleeve of pullover shirt/dresss;Thread/unthread left sleeve of pullover shirt/dress;Put head through opening of pull over shirt/dress;Pull shirt over trunk Upper body dressing/undressing: 5: Supervision: Safety issues/verbal cues FIM - Lower Body Dressing/Undressing Lower body dressing/undressing steps patient completed: Thread/unthread right underwear leg;Thread/unthread left underwear leg;Pull underwear up/down;Thread/unthread right pants leg;Thread/unthread left pants leg;Pull pants up/down;Fasten/unfasten left shoe;Fasten/unfasten right shoe;Don/Doff right shoe;Don/Doff left shoe;Don/Doff left sock;Don/Doff right sock Lower  body dressing/undressing: 5: Supervision: Safety issues/verbal cues  FIM - Toileting Toileting steps completed by patient: Performs perineal hygiene Toileting Assistive Devices: Grab bar or rail for support Toileting: 5: Supervision: Safety issues/verbal cues  FIM - Radio producer Devices: Environmental consultant;Bedside commode Toilet Transfers: 5-To toilet/BSC: Supervision (verbal cues/safety issues)  FIM - Engineer, site Assistive Devices: Arm rests;Walker;Orthosis Bed/Chair Transfer: 5: Sit > Supine: Supervision (verbal cues/safety issues);5: Bed > Chair or W/C: Supervision (verbal cues/safety issues);5: Chair or W/C > Bed: Supervision (verbal cues/safety issues)  FIM - Locomotion: Wheelchair Distance: 150 Locomotion: Wheelchair: 5: Travels 150 ft or more: maneuvers on rugs and over door sills with supervision, cueing or coaxing FIM - Locomotion: Ambulation Locomotion: Ambulation Assistive Devices: Walker - Rolling;Orthosis (R AFO) Ambulation/Gait Assistance: 5: Supervision Locomotion: Ambulation: 5: Travels 150 ft or more with supervision/safety issues  Comprehension Comprehension Mode: Auditory Comprehension: 6-Follows complex conversation/direction: With extra time/assistive device  Expression Expression Mode: Verbal Expression: 6-Expresses complex ideas: With extra time/assistive device  Social Interaction Social Interaction: 6-Interacts appropriately with others with medication or extra time (anti-anxiety, antidepressant).  Problem Solving Problem Solving: 5-Solves complex 90% of the time/cues < 10% of the time  Memory Memory: 6-More than reasonable amt of time  Medical Problem List and Plan:  1. 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 Status post revascularization with clot retrieval  2. DVT Prophylaxis/Anticoagulation: SCDs.no signs of DVT , d/c SQ heparin 3. Pain Management:  Tylenol as needed  4. Mood/anxiety: Xanax 0.5 mg tid prn,  Bed alarm for safety  5. Neuropsych: This patient is capable of making decisions on his own behalf.  6. Dysphagia.  thin liquids. Tolerating  D3 7.Hypertension. Cozaar 25 mg daily, Lasix 20 mg daily, Aldactone 12.5 mg daily. Monitor with increased mobility  8. Cardiomyopathy/CHF. Monitor for any signs of fluid overload. Followup cardiology services. Cardiac cath clean, low EJ fx non ischemic  9.  Coughing with irritable throat- improved on gabapentin,not needing daytime dose,LOS (Days) 15 A FACE TO FACE EVALUATION WAS PERFORMED  Charlett Blake 06/08/2013, 7:36 AM

## 2013-06-08 NOTE — Progress Notes (Signed)
Social Work Discharge Note Discharge Note  The overall goal for the admission was met for:   Discharge location: Yes-HOME WITH WIFE WHO CAN PROVIDE 24 HR SUPERVISION   Length of Stay: Yes-27 DAYS  Discharge activity level: Yes-SUPERVISION LEVEL  Home/community participation: Yes  Services provided included: MD, RD, PT, OT, SLP, RN, CM, TR, Pharmacy, Neuropsych and SW  Financial Services: Private Insurance: [employer]  Follow-up services arranged: Outpatient: [facility]-OPPT,OT,SPT-5/7 9;45-12;00, DME: [facility],TUB Ricka Burdock and Patient/Family has no preference for HH/DME agencies  Comments (or additional information):FAMILY [place]. WIFE TO FOLLOW UP WITH SSD AND LONG TERN DISABILITY THRU PT'S JOB  Patient/Family verbalized understanding of follow-up arrangements: Yes  Individual responsible for coordination of the follow-up plan: [facility]-WIFE & PATIENT  Confirmed correct DME delivered: Elease Hashimoto 06/08/2013    Gardiner Rhyme Larya Charpentier

## 2013-06-09 ENCOUNTER — Telehealth: Payer: Self-pay

## 2013-06-09 DIAGNOSIS — I639 Cerebral infarction, unspecified: Secondary | ICD-10-CM

## 2013-06-09 NOTE — Telephone Encounter (Signed)
Levada Dy with [facility] called to get order for ST and OT for patient.  He has an appointment today.  Order placed and faxed to [phone].

## 2013-07-15 ENCOUNTER — Encounter: Payer: Self-pay | Admitting: Physical Medicine & Rehabilitation

## 2013-07-15 ENCOUNTER — Encounter: Payer: BC Managed Care – PPO | Attending: Physical Medicine & Rehabilitation

## 2013-07-15 ENCOUNTER — Ambulatory Visit (HOSPITAL_BASED_OUTPATIENT_CLINIC_OR_DEPARTMENT_OTHER): Payer: BC Managed Care – PPO | Admitting: Physical Medicine & Rehabilitation

## 2013-07-15 ENCOUNTER — Inpatient Hospital Stay: Payer: BC Managed Care – PPO | Admitting: Physical Medicine & Rehabilitation

## 2013-07-15 VITALS — BP 156/96 | HR 78 | Resp 14 | Ht 71.0 in | Wt 169.0 lb

## 2013-07-15 DIAGNOSIS — K219 Gastro-esophageal reflux disease without esophagitis: Secondary | ICD-10-CM | POA: Insufficient documentation

## 2013-07-15 DIAGNOSIS — R209 Unspecified disturbances of skin sensation: Secondary | ICD-10-CM | POA: Insufficient documentation

## 2013-07-15 DIAGNOSIS — I69959 Hemiplegia and hemiparesis following unspecified cerebrovascular disease affecting unspecified side: Secondary | ICD-10-CM | POA: Insufficient documentation

## 2013-07-15 DIAGNOSIS — I63519 Cerebral infarction due to unspecified occlusion or stenosis of unspecified middle cerebral artery: Secondary | ICD-10-CM

## 2013-07-15 DIAGNOSIS — G8110 Spastic hemiplegia affecting unspecified side: Secondary | ICD-10-CM

## 2013-07-15 DIAGNOSIS — I69922 Dysarthria following unspecified cerebrovascular disease: Secondary | ICD-10-CM

## 2013-07-15 DIAGNOSIS — I10 Essential (primary) hypertension: Secondary | ICD-10-CM | POA: Insufficient documentation

## 2013-07-15 DIAGNOSIS — I6350 Cerebral infarction due to unspecified occlusion or stenosis of unspecified cerebral artery: Secondary | ICD-10-CM

## 2013-07-15 DIAGNOSIS — I69998 Other sequelae following unspecified cerebrovascular disease: Secondary | ICD-10-CM | POA: Insufficient documentation

## 2013-07-15 DIAGNOSIS — I519 Heart disease, unspecified: Secondary | ICD-10-CM

## 2013-07-15 NOTE — Patient Instructions (Signed)
Need to followup with cardiology  Need to find PCP

## 2013-07-15 NOTE — Progress Notes (Signed)
Subjective:    Patient ID: Isaac Johnson, male    DOB: 09/19/1965, 48 y.o.   MRN: [id number] 48 year old right-handed male  with history of congestive heart failure, brain lipoma admitted to  outside hospital on May 06, 2013, with right-sided weakness. He was  treated with tPA, transferred to [facility] for further  treatment. CT of the head negative for acute changes. Echocardiogram  with ejection fraction 25% with diffuse hypokinesis with grade 2  diastolic dysfunction. Carotid Dopplers without ICA stenosis. The  patient improved after tPA. On May 07, 2013, he developed recurrent  dense right-sided weakness. 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 He  underwent cerebral angiogram with revascularization and clot retrieval  by Interventional Radiology. He was evaluated by Cardiology Services  who recommend heart catheterization prior to discharge to rule out CAD  HPI Fall one month ago at home no injury Stressed out couldn't find office Looking for new PCP Hasn't followed up with cardiology yet.  HR during therapy in 80s  Dressing independently Needs assist with bathing Walks with a walker and right AFO  Going to outpatient therapy at local hospital 3 times per week with PT OT speech Pain Inventory Average Pain 3 Pain Right Now 2 My pain is intermittent and aching  In the last 24 hours, has pain interfered with the following? General activity 3 Relation with others 3 Enjoyment of life 3 What TIME of day is your pain at its worst? morning Sleep (in general) Good  Pain is worse with: some activites Pain improves with: rest Relief from Meds: 9  Mobility walk with assistance use a walker ability to climb steps?  yes do you drive?  no transfers  alone  Function employed # of hrs/week na disabled: date disabled na I need assistance with the following:  meal prep, household duties and shopping  Neuro/Psych weakness trouble walking anxiety  Prior Studies Any changes since last visit?  no  Physicians involved in your care Any changes since last visit?  no   Family History  Problem Relation Age of Onset  . Stroke Mother 63  . Gout Father    History   Social History  . Marital Status: Married    Spouse Name: N/A    Number of Children: N/A  . Years of Education: N/A   Social History Main Topics  . Smoking status: Never Smoker   . Smokeless tobacco: Never Used  . Alcohol Use: No  . Drug Use: No  . Sexual Activity: None   Other Topics Concern  . None   Social History Narrative  . None   Past Surgical History  Procedure Laterality Date  . Radiology with anesthesia N/A 05/07/2013    Procedure: RADIOLOGY WITH ANESTHESIA;  Surgeon: Rob Hickman, MD;  Location: [place];  Service: Radiology;  Laterality: N/A;  . Cardiac catheterization  05/23/2013  . Mca stent     Past Medical History  Diagnosis Date  . Hypertension   . Stomach ulcer   . Gait instability   . Proteinuria   . GERD (gastroesophageal reflux disease)   . Insomnia   . Dizziness   . Leukopenia   . Sciatica     left  . Brain lipoma 2010    Dr. Trenton Gammon  . CHF (congestive heart  failure)   . Acute systolic heart failure   . Cardiomyopathy   . CVA (cerebral infarction)   . Shortness of breath   . Anxiety   . Stroke    BP 156/96  Pulse 78  Resp 14  Ht 5\' 11"  (1.803 m)  Wt 169 lb (76.658 kg)  BMI 23.58 kg/m2  SpO2 99%  Opioid Risk Score:   Fall Risk Score: High Fall Risk (>13 points) (pt educated on fall risk, brochure given to pt)   Review of Systems  Musculoskeletal: Positive for gait problem.  Neurological: Positive for weakness.  Psychiatric/Behavioral: The patient is nervous/anxious.   All other systems reviewed and are  negative.      Objective:   Physical Exam  Motor strength is 4/5 in the right deltoid, bicep, tricep, grip 4/5 in the right hip flexor knee extensor 2 minus in the ankle dorsiflexor plantar flexor as well as the toe flexors and extensors  5/5 on the left side.  Light touch and sharp touch reduced in the right hand Normal light touch and sharp touch in the left hand Normal in the left leg Normal on the right leg Ambulation is with a rolling walker as well as a right AFO. Slow gait velocity     Assessment & Plan:  And 1. Left MCA distribution infarct with residual right hemiparesis and right hemisensory deficits. Also has dysarthria that worsens with fatigue. Recommend continue outpatient PT, OT, speech therapy  Also recommend followup with cardiology Also recommend followup with primary care physician  Not able to return to work at the current time. Patient worked in a [employer] in [employer]. Use both hands frequently. Pushed and pulled objects. Frequent walking up and down steps  He is unable to do these activities He is unable to walk without a walker. Therefore he cannot carry any objects. Also reduced movement as well as sensation and strength in the right hand. Patient is right handed. Recommend check with benefits department in terms of duration of short term disability. In addition patient was having shortness of breath with step climbing even prior to stroke. This is likely a sign of his congestive heart failure and this also may be a potentially disabling condition,  he'll need to followup with cardiology on this. Return to work as indefinite at this time. Will followup once therapy is completed Anticipate probable need for long term disability

## 2013-07-22 ENCOUNTER — Telehealth: Payer: Self-pay

## 2013-07-22 NOTE — Telephone Encounter (Signed)
Pharmacy request received to refill Alprazolam 0.5 mg. Please advise.

## 2013-07-22 NOTE — Telephone Encounter (Signed)
This is not intended to be a long-term medication do not refill

## 2013-07-25 ENCOUNTER — Other Ambulatory Visit: Payer: Self-pay

## 2013-08-01 ENCOUNTER — Telehealth: Payer: Self-pay

## 2013-08-01 NOTE — Telephone Encounter (Signed)
Needs to get from PCP, I do not plantar prescribe his long-term. This was mainly a hospital medication

## 2013-08-01 NOTE — Telephone Encounter (Signed)
Refill request from [facility] for xanax 0.5mg  tid prn #30.  Please advise.

## 2013-08-18 ENCOUNTER — Encounter: Payer: Self-pay | Admitting: *Deleted

## 2013-08-22 ENCOUNTER — Encounter: Payer: Self-pay | Admitting: Physician Assistant

## 2013-08-22 ENCOUNTER — Ambulatory Visit (INDEPENDENT_AMBULATORY_CARE_PROVIDER_SITE_OTHER): Payer: BC Managed Care – PPO | Admitting: Physician Assistant

## 2013-08-22 VITALS — BP 140/119 | HR 82 | Ht 71.0 in | Wt 178.0 lb

## 2013-08-22 DIAGNOSIS — I10 Essential (primary) hypertension: Secondary | ICD-10-CM

## 2013-08-22 DIAGNOSIS — I5022 Chronic systolic (congestive) heart failure: Secondary | ICD-10-CM

## 2013-08-22 DIAGNOSIS — I509 Heart failure, unspecified: Secondary | ICD-10-CM

## 2013-08-22 DIAGNOSIS — I428 Other cardiomyopathies: Secondary | ICD-10-CM

## 2013-08-22 DIAGNOSIS — Z8673 Personal history of transient ischemic attack (TIA), and cerebral infarction without residual deficits: Secondary | ICD-10-CM

## 2013-08-22 MED ORDER — CARVEDILOL 6.25 MG PO TABS: 6.2500 mg | ORAL_TABLET | Freq: Two times a day (BID) | ORAL | Status: DC

## 2013-08-22 NOTE — Progress Notes (Addendum)
Cardiology Office Note    Date:  08/22/2013   ID:  SIPRIANO FENDLEY, DOB May 02, 1965, MRN [id number]  PCP:  Monico Blitz, MD  Cardiologist:  Dr. Loralie Champagne >>> prefers f/u in [place]    History of Present Illness: Isaac Johnson is a 48 y.o. male with a hx of HTN, GERD, brain lipoma.  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  He received tPA and underwent L MCA revascularization with clot retrieval and stent placement by IR.  Echo demonstrated EF 25% and he was seen by Dr. Aundra Dubin with cardiology.  CVA was thought to likely be cardio-embolic given low EF.  LHC was planned.  However, he was allowed to rehab from his stroke first.  He was seen by cardiology again with a/c systolic CHF.  He was diuresed with IV Lasix.  He ultimately underwent cardiac catheterization which demonstrated normal coronary arteries. CHF medications were adjusted.  SPEP demonstrated a nonspecific pattern. HIV was nonreactive. ANA was negative.  Progress notes from the hospital indicate that the patient could be considered for Coumadin given possibility of cardio-embolic CVA. However, discharge summary from 06/2013 from inpatient rehabilitation notes that the patient remained on aspirin and Plavix.    He is now at home.  He is here with his wife and daughter.  He continues to have residual RUE/RLE weakness.  He sees PM&R and neurology for follow up in the next 30 days.  He denies chest pain, dyspnea, syncope, orthopnea, PND, edema.  He is probably NYHA 2.     Studies:  - LHC (05/23/13):  Normal coronary arteries, EF 25%  - Echo (05/06/13):  EF 25%, diffuse HK, grade 2 diastolic dysfunction, trivial MR, moderate LAE, mildly reduced RV function, mild RAE  - Carotid US (05/2013):  Bilateral ICA 1-39%   Recent Labs: 05/06/2013: HDL Cholesterol by NMR 37*; LDL (calc) 74  05/17/2013: ALT 24  05/20/2013: Pro B Natriuretic peptide (BNP) 2735.0*  05/24/2013: Creatinine 1.28; Hemoglobin 14.4; Potassium 3.7   Wt Readings from Last 3  Encounters:  08/22/13 178 lb (80.74 kg)  07/15/13 169 lb (76.658 kg)  06/01/13 160 lb 11.5 oz (72.9 kg)     Past Medical History  Diagnosis Date  . Hypertension   . Stomach ulcer   . Gait instability   . Proteinuria   . GERD (gastroesophageal reflux disease)   . Insomnia   . Dizziness   . Leukopenia   . Sciatica     left  . Brain lipoma 2010    Dr. Trenton Gammon  . CHF (congestive heart failure)   . Acute systolic heart failure   . Cardiomyopathy   . CVA (cerebral infarction)   . Shortness of breath   . Anxiety   . Stroke     Current Outpatient Prescriptions  Medication Sig Dispense Refill  . aspirin 81 MG EC tablet Take 1 tablet (81 mg total) by mouth daily.  30 tablet  12  . carvedilol (COREG) 3.125 MG tablet Take 1 tablet (3.125 mg total) by mouth 2 (two) times daily with a meal.  60 tablet  1  . citalopram (CELEXA) 10 MG tablet Take 1 tablet (10 mg total) by mouth daily.  30 tablet  1  . clonazePAM (KLONOPIN) 0.5 MG tablet       . clopidogrel (PLAVIX) 75 MG tablet Take 1 tablet (75 mg total) by mouth daily with breakfast.  30 tablet  1  . furosemide (LASIX) 40 MG tablet Take 1  tablet (40 mg total) by mouth daily.  30 tablet  1  . gabapentin (NEURONTIN) 300 MG capsule Take 1 capsule (300 mg total) by mouth at bedtime.  30 capsule  1  . loratadine (CLARITIN) 10 MG tablet Take 1 tablet (10 mg total) by mouth at bedtime.  30 tablet  1  . losartan (COZAAR) 25 MG tablet Take 1 tablet (25 mg total) by mouth 2 (two) times daily.  60 tablet  1  . nitroGLYCERIN (NITROSTAT) 0.4 MG SL tablet Place 1 tablet (0.4 mg total) under the tongue every 5 (five) minutes x 3 doses as needed for chest pain.  30 tablet  12  . omeprazole (PRILOSEC) 20 MG capsule Take 1 capsule (20 mg total) by mouth daily.  30 capsule  1  . potassium chloride SA (K-DUR,KLOR-CON) 20 MEQ tablet Take 2 tablets (40 mEq total) by mouth daily.  60 tablet  1  . spironolactone (ALDACTONE) 12.5 mg TABS tablet Take 0.5 tablets  (12.5 mg total) by mouth daily.      . sucralfate (CARAFATE) 1 G tablet Take 1 tablet (1 g total) by mouth 2 (two) times daily.  60 tablet  1   No current facility-administered medications for this visit.    Allergies:   Review of patient's allergies indicates no known allergies.   Social History:  The patient  reports that he has never smoked. He has never used smokeless tobacco. He reports that he does not drink alcohol or use illicit drugs.   Family History:  The patient's family history includes Diabetes in his maternal grandmother; Gout in his father; Hypertension in his maternal grandmother; Stroke in his maternal grandmother; Stroke (age of onset: 47) in his mother.   ROS:  Please see the history of present illness.   Denies any bleeding.  He has had a non-productive cough that is probably related to acid reflux.     All other systems reviewed and negative.   PHYSICAL EXAM: VS:  BP 140/119  Pulse 82  Ht 5\' 11"  (1.803 m)  Wt 178 lb (80.74 kg)  BMI 24.84 kg/m2 Well nourished, well developed, in no acute distress HEENT: normal Neck: no JVD Cardiac:  normal S1, S2; RRR; no murmur Lungs:  clear to auscultation bilaterally, no wheezing, rhonchi or rales Abd: soft, nontender, no hepatomegaly Ext: no edema Skin: warm and dry Neuro:  CNs 2-12 intact, no focal abnormalities noted  EKG:  NSR, HR 82, normal axis, T-wave inversions in 3, aVF, V4-V6, no significant change when compared to prior tracings      ASSESSMENT AND PLAN:  1. NICM (nonischemic cardiomyopathy): Cardiac catheterization in April demonstrated no CAD. Blood pressure remains uncontrolled. I will increase his Coreg to 6.25 mg twice a day. Continue current dose of ARB and spironolactone. Consider adjusting these if his blood pressure remains uncontrolled. Recent labs were obtained by his primary care physician. We will request the most recent basic metabolic panel. I will review his case further with Dr. Aundra Dubin, who saw  him in the hospital, regarding whether or not we should pursue a cardiac MRI versus follow up echocardiogram. 2. Chronic systolic CHF (congestive heart failure): Volume is stable. Continue current dose of diuretic. He is NYHA 2-2b. 3. HYPERTENSION:  Uncontrolled. Adjust carvedilol as noted. 4. History of stroke:  Follow up with neurology as planned. He remains on aspirin and Plavix. There was a question of whether or not his stroke was cardio-embolic given his low EF. I will review  this further with Dr. Aundra Dubin as well in regards to whether or not he should be on Coumadin. 5. Disposition: The patient prefers to follow up with one of our cardiologists in [place], where he lives. This will be arranged in the next 3-4 weeks.   Signed, Versie Starks, MHS 08/22/2013 11:53 AM    [facility] [place], [place], [place]  [address] Phone: [phone]; Fax: [phone]    Addendum: I did review with Dr. Loralie Champagne.  He felt coumadin was only indicated if patient had thrombus noted on Echo.  There was no thrombus noted and he will remain on current therapy. Also, Dr. Aundra Dubin recommended f/u cardiac MRI 3 mos out from dx to reassess LVF and to r/o infiltrative CM.  Patient lives in [place] and will f/u there.  He will see Dr. Carlyle Dolly next month.  I will leave it up to him as to when the MRI is done. Signed,  Versie Starks, MHS 08/29/2013 5:24 PM

## 2013-08-22 NOTE — Patient Instructions (Signed)
Your physician has recommended you make the following change in your medication  1. INCREASE COREG 6.25 TWICE A DAY SENT IN NEW PRESCRIPTION   Your physician recommends that you schedule a follow-up appointment in: WITH A DR AT [facility] LOC[facility] IN 3 TO 4 WEEKS

## 2013-08-30 ENCOUNTER — Ambulatory Visit: Payer: BC Managed Care – PPO | Admitting: Physical Medicine & Rehabilitation

## 2013-09-13 ENCOUNTER — Ambulatory Visit: Payer: Self-pay | Admitting: Neurology

## 2013-09-16 ENCOUNTER — Ambulatory Visit: Payer: Self-pay | Admitting: Neurology

## 2013-09-27 ENCOUNTER — Encounter: Payer: Self-pay | Admitting: Cardiology

## 2013-09-27 ENCOUNTER — Ambulatory Visit (INDEPENDENT_AMBULATORY_CARE_PROVIDER_SITE_OTHER): Payer: BC Managed Care – PPO | Admitting: Cardiology

## 2013-09-27 VITALS — BP 135/87 | HR 76 | Ht 71.0 in | Wt 185.0 lb

## 2013-09-27 DIAGNOSIS — I428 Other cardiomyopathies: Secondary | ICD-10-CM

## 2013-09-27 DIAGNOSIS — R400 Somnolence: Secondary | ICD-10-CM

## 2013-09-27 DIAGNOSIS — R404 Transient alteration of awareness: Secondary | ICD-10-CM

## 2013-09-27 DIAGNOSIS — I10 Essential (primary) hypertension: Secondary | ICD-10-CM

## 2013-09-27 DIAGNOSIS — I5022 Chronic systolic (congestive) heart failure: Secondary | ICD-10-CM

## 2013-09-27 DIAGNOSIS — I509 Heart failure, unspecified: Secondary | ICD-10-CM

## 2013-09-27 MED ORDER — CARVEDILOL 12.5 MG PO TABS: 12.5000 mg | ORAL_TABLET | Freq: Two times a day (BID) | ORAL | Status: DC

## 2013-09-27 NOTE — Patient Instructions (Addendum)
   Increase Coreg to 12.5mg  twice a day - new sent to pharm  Continue all other medications.   Referral to Dr. Redmond Pulling for sleep study Follow up in  3 weeks

## 2013-09-27 NOTE — Progress Notes (Signed)
Clinical Summary Isaac Johnson is a 48 y.o.male last seen by PA Kathlen Mody, this is our first visit together. He is seen for the following medical problems.  1. Chronic systolic heart failure - NICM, cath 05/2013 with patent coronaries. RHC with RA 8, mean PA 26, PCWP 21, CI 3.1 - echo 05/2013 LVEF 25%, diffuse hypokinesis, grade II diastolic dysfunction.  - last visit with PA Kathlen Mody 08/22/13 coreg was increased to 6.25mg  bid - from prior notes discussions about cardiac MRI to evaluate for infiltrative disease - tolerated increased coreg well.  - denies any LE edema, no DOE, no orthopnea  2. HTN - does not check at home.  - compliant with meds  3. Brain lipoma  4. CVA - recent admit 05/2013 with CVA, received tPA and underwent left MCA revasc with clot retrieval by IR  5. OSA screening + snoring + apneic episodes. Mild daytime somnolence Past Medical History  Diagnosis Date  . Hypertension   . Stomach ulcer   . Gait instability   . Proteinuria   . GERD (gastroesophageal reflux disease)   . Insomnia   . Dizziness   . Leukopenia   . Sciatica     left  . Brain lipoma 2010    Dr. Trenton Gammon  . CHF (congestive heart failure)   . Acute systolic heart failure   . Cardiomyopathy   . CVA (cerebral infarction)   . Shortness of breath   . Anxiety   . Stroke      No Known Allergies   Current Outpatient Prescriptions  Medication Sig Dispense Refill  . aspirin 81 MG EC tablet Take 1 tablet (81 mg total) by mouth daily.  30 tablet  12  . carvedilol (COREG) 6.25 MG tablet Take 1 tablet (6.25 mg total) by mouth 2 (two) times daily with a meal.  60 tablet  11  . citalopram (CELEXA) 10 MG tablet Take 1 tablet (10 mg total) by mouth daily.  30 tablet  1  . clonazePAM (KLONOPIN) 0.5 MG tablet       . clopidogrel (PLAVIX) 75 MG tablet Take 1 tablet (75 mg total) by mouth daily with breakfast.  30 tablet  1  . furosemide (LASIX) 40 MG tablet Take 1 tablet (40 mg total) by mouth daily.  30  tablet  1  . gabapentin (NEURONTIN) 300 MG capsule Take 1 capsule (300 mg total) by mouth at bedtime.  30 capsule  1  . loratadine (CLARITIN) 10 MG tablet Take 1 tablet (10 mg total) by mouth at bedtime.  30 tablet  1  . losartan (COZAAR) 25 MG tablet Take 1 tablet (25 mg total) by mouth 2 (two) times daily.  60 tablet  1  . nitroGLYCERIN (NITROSTAT) 0.4 MG SL tablet Place 1 tablet (0.4 mg total) under the tongue every 5 (five) minutes x 3 doses as needed for chest pain.  30 tablet  12  . omeprazole (PRILOSEC) 20 MG capsule Take 1 capsule (20 mg total) by mouth daily.  30 capsule  1  . potassium chloride SA (K-DUR,KLOR-CON) 20 MEQ tablet Take 2 tablets (40 mEq total) by mouth daily.  60 tablet  1  . spironolactone (ALDACTONE) 12.5 mg TABS tablet Take 0.5 tablets (12.5 mg total) by mouth daily.      . sucralfate (CARAFATE) 1 G tablet Take 1 tablet (1 g total) by mouth 2 (two) times daily.  60 tablet  1   No current facility-administered medications for this visit.  Past Surgical History  Procedure Laterality Date  . Radiology with anesthesia N/A 05/07/2013    Procedure: RADIOLOGY WITH ANESTHESIA;  Surgeon: Rob Hickman, MD;  Location: [facility];  Service: Radiology;  Laterality: N/A;  . Cardiac catheterization  05/23/2013  . Mca stent       No Known Allergies    Family History  Problem Relation Age of Onset  . Stroke Mother 33  . Gout Father   . Hypertension Maternal Grandmother   . Stroke Maternal Grandmother   . Diabetes Maternal Grandmother      Social History Isaac Johnson reports that he has never smoked. He has never used smokeless tobacco. Isaac Johnson reports that he does not drink alcohol.   Review of Systems CONSTITUTIONAL: No weight loss, fever, chills, weakness or fatigue.  HEENT: Eyes: No visual loss, blurred vision, double vision or yellow sclerae.No hearing loss, sneezing, congestion, runny nose or sore throat.  SKIN: No rash or itching.  CARDIOVASCULAR: per  HPI RESPIRATORY: No shortness of breath, cough or sputum.  GASTROINTESTINAL: No anorexia, nausea, vomiting or diarrhea. No abdominal pain or blood.  GENITOURINARY: No burning on urination, no polyuria NEUROLOGICAL: right leg and arm weakness. MUSCULOSKELETAL: No muscle, back pain, joint pain or stiffness.  LYMPHATICS: No enlarged nodes. No history of splenectomy.  PSYCHIATRIC: No history of depression or anxiety.  ENDOCRINOLOGIC: No reports of sweating, cold or heat intolerance. No polyuria or polydipsia.  [name]   Physical Examination p 76 bp 135/87 Wt 185 lbs BMI 26 Gen: resting comfortably, no acute distress HEENT: no scleral icterus, pupils equal round and reactive, no palptable cervical adenopathy,  CV: RRR, no m/r/g, no JVD, no carotid bruits Resp: Clear to auscultation bilaterally GI: abdomen is soft, non-tender, non-distended, normal bowel sounds, no hepatosplenomegaly MSK: extremities are warm, no edema.  Skin: warm, no rash Neuro:  Right upper and lower extremity weakness Psych: appropriate affect   Diagnostic Studies 05/2013 Cath Angiographic Findings:  1. The left main coronary artery is free of significant atherosclerosis and bifurcates in the usual fashion into the left anterior descending artery and left circumflex coronary artery.  2. The left anterior descending artery is a large vessel that reaches the apex and generates one major diagonal Isaac Johnson. There is evidence of no luminal irregularities and no calcification. No hemodynamically meaningful stenoses are seen.  3. The left circumflex coronary artery is a large-size vessel non dominant vessel that generates three major oblique marginal arteries, the more distal being by far the largest. There is evidence of no luminal irregularities and no calcification. No hemodynamically meaningful stenoses are seen.  4. The right coronary artery is a medium-size dominant vessel that generates only a posterior descending artery. There  is evidence of no luminal irregularities and no calcification. No hemodynamically meaningful stenoses are seen.  5. The left ventricle is normal in size. The left ventricle systolic function is severely decreased with an estimated ejection fraction of 25%. Regional wall motion abnormalities are not seen. No left ventricular thrombus is seen. There is moderate mitral insufficiency, but there was probably catheter entanglement in the subvalvular apparatus.. The ascending aorta appears normal. There is no aortic valve stenosis by pullback. The left ventricular end-diastolic pressure is 18 mm Hg.   Hemodynamic findings:  Aortic pressure 137/79 (mean 102 ) mm Hg  Left ventricle 295/1 with end-diastolic pressure of 18 mm Hg  PA wedge pressure a wave 21, v wave 20 (mean 16) mm Hg  Pulmonary artery 40/16 (mean 26)  mm Hg  Right ventricle 37/2 with an end-diastolic pressure of 5 mm Hg  Right atrium a wave 8, v wave 7 (mean 5) mm Hg  Cardiac output is 6 L per minute (cardiac index 3.1 L per minute per meter sq)  Oxygen saturation: Aortic 96%, pulmonary artery 73%  SVR 1289 dsc, SVRI 2630  PVR 133, PVRI 258  IMPRESSIONS:  Nonischemic cardiomyopathy, compensated.  Normal coronary arteries.   05/2013 Echo Study Conclusions  - Left ventricle: The cavity size was mildly dilated. Wall thickness was normal. The estimated ejection fraction was 25%. Diffuse hypokinesis. Features are consistent with a pseudonormal left ventricular filling pattern, with concomitant abnormal relaxation and increased filling pressure (grade 2 diastolic dysfunction). - Aortic valve: There was no stenosis. - Mitral valve: Trivial regurgitation. - Left atrium: The atrium was moderately dilated. - Right ventricle: The cavity size was normal. Systolic function was mildly reduced. - Right atrium: The atrium was mildly dilated. - Pulmonary arteries: No complete TR doppler jet so unable to estimate PA systolic pressure. - Systemic  veins: IVC measured 2.3 cm with < 50% respirophasic variation, suggesting RA pressure 15 mmHg. - Pericardium, extracardiac: A trivial pericardial effusion was identified. Impressions:  - Mildly dilated LV with EF 25%, diffuse hypokinesis. Moderate diastolic dysfunction. Normal RV size with mildly decreased systolic function. No significant valvular abnormalities.      Assessment and Plan  1. Chronic systolic heart failure - LVEF 25% by echo 05/2013, NYHA II, NICM. Does not have ICD as he is new diagnosis and undergoing trial of medical therapy - increase coreg to 12.5mg  bid today, further titration as tolerated - one optimized on medical therapy, reevaluate LVEF  2. HTN - at goal, continue current meds   3. OSA screening - risk factors for OSA, in setting of systolic heart failure needs evaluation - refer to sleep medcine   Arnoldo Lenis, M.D., F.A.C.C.

## 2013-09-29 ENCOUNTER — Encounter: Payer: Self-pay | Admitting: Neurology

## 2013-10-07 ENCOUNTER — Telehealth: Payer: Self-pay | Admitting: *Deleted

## 2013-10-07 NOTE — Telephone Encounter (Signed)
Isaac Johnson has called and states he needs a physician statement from Dr Letta Pate sent to [employer] for him to get long term disability. I contacted Isaac Johnson to request pt get [name] to fax a form to [place]. No answer and no name on voicemail so I have left message to call us back.

## 2013-10-18 ENCOUNTER — Ambulatory Visit (INDEPENDENT_AMBULATORY_CARE_PROVIDER_SITE_OTHER): Payer: BC Managed Care – PPO | Admitting: Cardiology

## 2013-10-18 ENCOUNTER — Encounter: Payer: Self-pay | Admitting: Cardiology

## 2013-10-18 VITALS — BP 149/91 | HR 64 | Ht 72.0 in | Wt 186.6 lb

## 2013-10-18 DIAGNOSIS — I5022 Chronic systolic (congestive) heart failure: Secondary | ICD-10-CM

## 2013-10-18 DIAGNOSIS — I509 Heart failure, unspecified: Secondary | ICD-10-CM

## 2013-10-18 DIAGNOSIS — I10 Essential (primary) hypertension: Secondary | ICD-10-CM

## 2013-10-18 MED ORDER — CARVEDILOL 25 MG PO TABS: 25.0000 mg | ORAL_TABLET | Freq: Two times a day (BID) | ORAL | Status: DC

## 2013-10-18 NOTE — Progress Notes (Addendum)
Clinical Summary Isaac Johnson is a 48 y.o.male seen today for follow up of the following medical problems.   1. Chronic systolic heart failure  - NICM, cath 05/2013 with patent coronaries. RHC with RA 8, mean PA 26, PCWP 21, CI 3.1  - echo 05/2013 LVEF 25%, diffuse hypokinesis, grade II diastolic dysfunction.  - from prior notes discussions about cardiac MRI to evaluate for infiltrative disease  - denies any LE edema, no DOE, no orthopnea  - last visit increased coreg to 12.5mg  bid, tolerated well.   2. HTN  - does not check at home.  - compliant with meds   3. CVA  - recent admit 05/2013 with CVA, received tPA and underwent left MCA revasc with clot retrieval by IR  - has completed rehab  4. OSA screening  + snoring + apneic episodes. Mild daytime somnolence - upcoming appointment with Dr Redmond Pulling for evalution    Past Medical History  Diagnosis Date  . Hypertension   . Stomach ulcer   . Gait instability   . Proteinuria   . GERD (gastroesophageal reflux disease)   . Insomnia   . Dizziness   . Leukopenia   . Sciatica     left  . Brain lipoma 2010    Dr. Trenton Gammon  . CHF (congestive heart failure)   . Acute systolic heart failure   . Cardiomyopathy   . CVA (cerebral infarction)   . Shortness of breath   . Anxiety   . Stroke      No Known Allergies   Current Outpatient Prescriptions  Medication Sig Dispense Refill  . aspirin 81 MG EC tablet Take 1 tablet (81 mg total) by mouth daily.  30 tablet  12  . carvedilol (COREG) 12.5 MG tablet Take 1 tablet (12.5 mg total) by mouth 2 (two) times daily.  60 tablet  6  . citalopram (CELEXA) 10 MG tablet Take 1 tablet (10 mg total) by mouth daily.  30 tablet  1  . clonazePAM (KLONOPIN) 0.5 MG tablet       . clopidogrel (PLAVIX) 75 MG tablet Take 1 tablet (75 mg total) by mouth daily with breakfast.  30 tablet  1  . furosemide (LASIX) 40 MG tablet Take 1 tablet (40 mg total) by mouth daily.  30 tablet  1  . gabapentin  (NEURONTIN) 300 MG capsule Take 1 capsule (300 mg total) by mouth at bedtime.  30 capsule  1  . loratadine (CLARITIN) 10 MG tablet Take 1 tablet (10 mg total) by mouth at bedtime.  30 tablet  1  . losartan (COZAAR) 25 MG tablet Take 1 tablet (25 mg total) by mouth 2 (two) times daily.  60 tablet  1  . nitroGLYCERIN (NITROSTAT) 0.4 MG SL tablet Place 1 tablet (0.4 mg total) under the tongue every 5 (five) minutes x 3 doses as needed for chest pain.  30 tablet  12  . omeprazole (PRILOSEC) 20 MG capsule Take 1 capsule (20 mg total) by mouth daily.  30 capsule  1  . potassium chloride SA (K-DUR,KLOR-CON) 20 MEQ tablet Take 2 tablets (40 mEq total) by mouth daily.  60 tablet  1  . spironolactone (ALDACTONE) 12.5 mg TABS tablet Take 0.5 tablets (12.5 mg total) by mouth daily.      . sucralfate (CARAFATE) 1 G tablet Take 1 tablet (1 g total) by mouth 2 (two) times daily.  60 tablet  1  . traMADol (ULTRAM) 50 MG tablet Take  50 mg by mouth every 6 (six) hours as needed.       No current facility-administered medications for this visit.     Past Surgical History  Procedure Laterality Date  . Radiology with anesthesia N/A 05/07/2013    Procedure: RADIOLOGY WITH ANESTHESIA;  Surgeon: Rob Hickman, MD;  Location: [facility];  Service: Radiology;  Laterality: N/A;  . Cardiac catheterization  05/23/2013  . Mca stent       No Known Allergies    Family History  Problem Relation Age of Onset  . Stroke Mother 42  . Gout Father   . Hypertension Maternal Grandmother   . Stroke Maternal Grandmother   . Diabetes Maternal Grandmother      Social History Mr. Stopa reports that he has never smoked. He has never used smokeless tobacco. Mr. Geister reports that he does not drink alcohol.   Review of Systems CONSTITUTIONAL: No weight loss, fever, chills, weakness or fatigue.  HEENT: Eyes: No visual loss, blurred vision, double vision or yellow sclerae.No hearing loss, sneezing, congestion, runny nose or  sore throat.  SKIN: No rash or itching.  CARDIOVASCULAR: per HPI RESPIRATORY: No shortness of breath, cough or sputum.  GASTROINTESTINAL: No anorexia, nausea, vomiting or diarrhea. No abdominal pain or blood.  GENITOURINARY: No burning on urination, no polyuria NEUROLOGICAL: No headache, dizziness, syncope, paralysis, ataxia, numbness or tingling in the extremities. No change in bowel or bladder control.  MUSCULOSKELETAL: No muscle, back pain, joint pain or stiffness.  LYMPHATICS: No enlarged nodes. No history of splenectomy.  PSYCHIATRIC: No history of depression or anxiety.  ENDOCRINOLOGIC: No reports of sweating, cold or heat intolerance. No polyuria or polydipsia.  [name]   Physical Examination p 64 bp 149/91 Wt 186 lbs BMI 25 Gen: resting comfortably, no acute distress HEENT: no scleral icterus, pupils equal round and reactive, no palptable cervical adenopathy,  CV: RRR, no m/r/g, no JVD, no carotid brutis Resp: Clear to auscultation bilaterally GI: abdomen is soft, non-tender, non-distended, normal bowel sounds, no hepatosplenomegaly MSK: extremities are warm, no edema.  Skin: warm, no rash Neuro:  no focal deficits Psych: appropriate affect   Diagnostic Studies 05/2013 Cath  Angiographic Findings:  1. The left main coronary artery is free of significant atherosclerosis and bifurcates in the usual fashion into the left anterior descending artery and left circumflex coronary artery.  2. The left anterior descending artery is a large vessel that reaches the apex and generates one major diagonal Juan Olthoff. There is evidence of no luminal irregularities and no calcification. No hemodynamically meaningful stenoses are seen.  3. The left circumflex coronary artery is a large-size vessel non dominant vessel that generates three major oblique marginal arteries, the more distal being by far the largest. There is evidence of no luminal irregularities and no calcification. No hemodynamically  meaningful stenoses are seen.  4. The right coronary artery is a medium-size dominant vessel that generates only a posterior descending artery. There is evidence of no luminal irregularities and no calcification. No hemodynamically meaningful stenoses are seen.  5. The left ventricle is normal in size. The left ventricle systolic function is severely decreased with an estimated ejection fraction of 25%. Regional wall motion abnormalities are not seen. No left ventricular thrombus is seen. There is moderate mitral insufficiency, but there was probably catheter entanglement in the subvalvular apparatus.. The ascending aorta appears normal. There is no aortic valve stenosis by pullback. The left ventricular end-diastolic pressure is 18 mm Hg.   Hemodynamic findings:  Aortic pressure 137/79 (mean 102 ) mm Hg  Left ventricle 315/4 with end-diastolic pressure of 18 mm Hg  PA wedge pressure a wave 21, v wave 20 (mean 16) mm Hg  Pulmonary artery 40/16 (mean 26) mm Hg  Right ventricle 37/2 with an end-diastolic pressure of 5 mm Hg  Right atrium a wave 8, v wave 7 (mean 5) mm Hg  Cardiac output is 6 L per minute (cardiac index 3.1 L per minute per meter sq)  Oxygen saturation: Aortic 96%, pulmonary artery 73%  SVR 1289 dsc, SVRI 2630  PVR 133, PVRI 258  IMPRESSIONS:  Nonischemic cardiomyopathy, compensated.  Normal coronary arteries.  05/2013 Echo  Study Conclusions  - Left ventricle: The cavity size was mildly dilated. Wall thickness was normal. The estimated ejection fraction was 25%. Diffuse hypokinesis. Features are consistent with a pseudonormal left ventricular filling pattern, with concomitant abnormal relaxation and increased filling pressure (grade 2 diastolic dysfunction). - Aortic valve: There was no stenosis. - Mitral valve: Trivial regurgitation. - Left atrium: The atrium was moderately dilated. - Right ventricle: The cavity size was normal. Systolic function was mildly reduced. -  Right atrium: The atrium was mildly dilated. - Pulmonary arteries: No complete TR doppler jet so unable to estimate PA systolic pressure. - Systemic veins: IVC measured 2.3 cm with < 50% respirophasic variation, suggesting RA pressure 15 mmHg. - Pericardium, extracardiac: A trivial pericardial effusion was identified. Impressions:  - Mildly dilated LV with EF 25%, diffuse hypokinesis. Moderate diastolic dysfunction. Normal RV size with mildly decreased systolic function. No significant valvular abnormalities.        Assessment and Plan  1. Chronic systolic heart failure  - LVEF 25% by echo 05/2013, NYHA II, NICM. Does not have ICD as he is new diagnosis and undergoing trial of medical therapy  - increase coreg to 25 mg bid today - once optimized on medical therapy, reevaluate LVEF   2. HTN  -elevated in clinic, follow with increased coreg dose.   3. OSA screening  -upcoming appointment with sleep medicine     F/u 1 month  Arnoldo Lenis, M.D.,

## 2013-10-18 NOTE — Patient Instructions (Signed)
   Increase Coreg to 25mg  twice a day  Continue all other medications.   Your physician wants you to follow up in:  1 month.

## 2013-10-25 ENCOUNTER — Encounter: Payer: BC Managed Care – PPO | Attending: Physical Medicine & Rehabilitation

## 2013-10-25 ENCOUNTER — Encounter: Payer: Self-pay | Admitting: Physical Medicine & Rehabilitation

## 2013-10-25 ENCOUNTER — Ambulatory Visit: Payer: BC Managed Care – PPO | Admitting: Physical Medicine & Rehabilitation

## 2013-10-25 ENCOUNTER — Ambulatory Visit (HOSPITAL_BASED_OUTPATIENT_CLINIC_OR_DEPARTMENT_OTHER): Payer: BC Managed Care – PPO | Admitting: Physical Medicine & Rehabilitation

## 2013-10-25 VITALS — BP 145/78 | HR 67 | Resp 14 | Ht 72.0 in | Wt 188.4 lb

## 2013-10-25 DIAGNOSIS — I69998 Other sequelae following unspecified cerebrovascular disease: Secondary | ICD-10-CM | POA: Insufficient documentation

## 2013-10-25 DIAGNOSIS — G8110 Spastic hemiplegia affecting unspecified side: Secondary | ICD-10-CM

## 2013-10-25 DIAGNOSIS — I10 Essential (primary) hypertension: Secondary | ICD-10-CM | POA: Diagnosis not present

## 2013-10-25 DIAGNOSIS — I69959 Hemiplegia and hemiparesis following unspecified cerebrovascular disease affecting unspecified side: Secondary | ICD-10-CM | POA: Diagnosis not present

## 2013-10-25 DIAGNOSIS — R209 Unspecified disturbances of skin sensation: Secondary | ICD-10-CM | POA: Insufficient documentation

## 2013-10-25 DIAGNOSIS — K219 Gastro-esophageal reflux disease without esophagitis: Secondary | ICD-10-CM | POA: Diagnosis not present

## 2013-10-25 NOTE — Progress Notes (Signed)
Subjective:    Patient ID: Isaac Johnson, male    DOB: May 18, 1965, 48 y.o.   MRN: [id number] 48 year old right-handed male  with history of congestive heart failure, brain lipoma admitted to  outside hospital on May 06, 2013, with right-sided weakness. He was  treated with tPA, transferred to [facility] for further  treatment. CT of the head negative for acute changes. Echocardiogram  with ejection fraction 25% with diffuse hypokinesis with grade 2  diastolic dysfunction. Carotid Dopplers without ICA stenosis. The  patient improved after tPA. On May 07, 2013, he developed recurrent  dense right-sided weakness. 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 He  underwent cerebral angiogram with revascularization and clot retrieval  by Interventional Radiology. He was evaluated by Cardiology Services  who recommend heart catheterization prior to discharge to rule out CAD  HPI  No further falls.  Has followed up with cardiology, Coreg dose has been increased Bathing independently except some problems washing his back. No longer using walker, and now uses a straight cane and right AFO  Finished with physical therapy about a month ago. Doing home exercise program which includes resistance bands Pain Inventory Average Pain 5 Pain Right Now 1 My pain is intermettent  In the last 24 hours, has pain interfered with the following? General activity 5 Relation with others 5 Enjoyment of life 6 What TIME of day is your pain at its worst? morning Sleep (in general) Fair  Pain is worse with: some activites Pain improves with: rest and medication Relief from Meds: 3  Mobility use a cane  Function disabled: date disabled  05/06/2013  Neuro/Psych weakness numbness anxiety  Prior Studies Any changes since last visit?  no  Physicians involved in your care Any changes since last visit?  no   Family History  Problem Relation Age of Onset  . Stroke Mother 64  . Gout Father   . Hypertension Maternal Grandmother   . Stroke Maternal Grandmother   . Diabetes Maternal Grandmother    History   Social History  . Marital Status: Married    Spouse Name: N/A    Number of Children: N/A  . Years of Education: N/A   Social History Main Topics  . Smoking status: Never Smoker   . Smokeless tobacco: Never Used  . Alcohol Use: No  . Drug Use: No  . Sexual Activity: None   Other Topics Concern  . None   Social History Narrative  . None   Past Surgical History  Procedure Laterality Date  . Radiology with anesthesia N/A 05/07/2013    Procedure: RADIOLOGY WITH ANESTHESIA;  Surgeon: Rob Hickman, MD;  Location: [facility];  Service: Radiology;  Laterality: N/A;  . Cardiac catheterization  05/23/2013  . Mca stent     Past Medical History  Diagnosis Date  . Hypertension   . Stomach ulcer   . Gait instability   . Proteinuria   . GERD (gastroesophageal reflux disease)   . Insomnia   . Dizziness   . Leukopenia   . Sciatica     left  . Brain lipoma 2010    Dr. Trenton Gammon  . CHF (congestive heart failure)   . Acute systolic heart failure   . Cardiomyopathy   . CVA (cerebral infarction)   . Shortness of  breath   . Anxiety   . Stroke    BP 145/78  Pulse 67  Resp 14  Ht 6' (1.829 m)  Wt 188 lb 6.4 oz (85.458 kg)  BMI 25.55 kg/m2  SpO2 99%  Opioid Risk Score:   Fall Risk Score: Low Fall Risk (0-5 points)   Review of Systems     Objective:   Physical Exam  Ambulates with a straight cane, wide base of support, AFO, no evidence of toe drag or knee instability.  metatarsal head pain right foot only  Normal light touch and sharp touch in both hands and both legs  Motor strength is 4+/5  right deltoid, bicep, tricep, grip 5/5 right hip flexor knee extensor 3 minus ankle dorsiflexor and plantar flexor as well as to flexors and extensors on the right side 5/5 in the left upper and left lower limb    Assessment & Plan:  1. Left MCA distribution infarct right hemiparesis has made some improvements with strength over last several months. No further PT OT need at this time.  Followup with cardiology and primary care physician.  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 Do not think patient will be able to go back to this work environment. Permanent restriction.  Return to clinic when necessary

## 2013-10-25 NOTE — Patient Instructions (Signed)
Continue current HEP program

## 2013-11-23 ENCOUNTER — Ambulatory Visit: Payer: BC Managed Care – PPO | Admitting: Cardiology

## 2013-12-02 ENCOUNTER — Other Ambulatory Visit (HOSPITAL_COMMUNITY): Payer: Self-pay | Admitting: Interventional Radiology

## 2013-12-02 ENCOUNTER — Telehealth (HOSPITAL_COMMUNITY): Payer: Self-pay | Admitting: Interventional Radiology

## 2013-12-02 DIAGNOSIS — I639 Cerebral infarction, unspecified: Secondary | ICD-10-CM

## 2013-12-02 NOTE — Telephone Encounter (Signed)
Called pt, spoke to his wife. She states that she will check their schedule and call me back to set the appointment up. JM

## 2013-12-14 ENCOUNTER — Telehealth (HOSPITAL_COMMUNITY): Payer: Self-pay | Admitting: Interventional Radiology

## 2013-12-14 NOTE — Telephone Encounter (Signed)
Barnett Applebaum called pt, left VM  For him to call and schedule f/u angio JM

## 2014-01-10 DIAGNOSIS — Z0289 Encounter for other administrative examinations: Secondary | ICD-10-CM

## 2014-01-12 ENCOUNTER — Encounter (HOSPITAL_COMMUNITY): Payer: Self-pay | Admitting: Cardiovascular Disease

## 2014-04-12 ENCOUNTER — Telehealth (HOSPITAL_COMMUNITY): Payer: Self-pay

## 2014-04-12 NOTE — Telephone Encounter (Signed)
Called pt to schedule angio but he did not answer. Could not leave voicemail bc voicemail was too full. AW

## 2014-04-13 ENCOUNTER — Telehealth (HOSPITAL_COMMUNITY): Payer: Self-pay | Admitting: Interventional Radiology

## 2014-07-10 ENCOUNTER — Other Ambulatory Visit: Payer: Self-pay | Admitting: Physician Assistant

## 2014-07-10 ENCOUNTER — Other Ambulatory Visit: Payer: Self-pay | Admitting: Radiology

## 2014-07-11 ENCOUNTER — Observation Stay (HOSPITAL_COMMUNITY)
Admission: AD | Admit: 2014-07-11 | Discharge: 2014-07-12 | Disposition: A | Discharge status: Home / Self Care | Payer: 59 | Source: Ambulatory Visit | Attending: Interventional Radiology | Admitting: Interventional Radiology

## 2014-07-11 ENCOUNTER — Encounter (HOSPITAL_COMMUNITY): Payer: Self-pay

## 2014-07-11 ENCOUNTER — Other Ambulatory Visit (HOSPITAL_COMMUNITY): Payer: Self-pay | Admitting: Interventional Radiology

## 2014-07-11 DIAGNOSIS — Z8673 Personal history of transient ischemic attack (TIA), and cerebral infarction without residual deficits: Secondary | ICD-10-CM | POA: Insufficient documentation

## 2014-07-11 DIAGNOSIS — I5022 Chronic systolic (congestive) heart failure: Secondary | ICD-10-CM | POA: Insufficient documentation

## 2014-07-11 DIAGNOSIS — Z7982 Long term (current) use of aspirin: Secondary | ICD-10-CM | POA: Insufficient documentation

## 2014-07-11 DIAGNOSIS — I6602 Occlusion and stenosis of left middle cerebral artery: Principal | ICD-10-CM | POA: Insufficient documentation

## 2014-07-11 DIAGNOSIS — Z7902 Long term (current) use of antithrombotics/antiplatelets: Secondary | ICD-10-CM | POA: Insufficient documentation

## 2014-07-11 DIAGNOSIS — I10 Essential (primary) hypertension: Secondary | ICD-10-CM | POA: Diagnosis not present

## 2014-07-11 DIAGNOSIS — I639 Cerebral infarction, unspecified: Secondary | ICD-10-CM | POA: Diagnosis present

## 2014-07-11 LAB — BASIC METABOLIC PANEL
Anion gap: 9 (ref 5–15)
BUN: 13 mg/dL (ref 6–20)
CALCIUM: 9.6 mg/dL (ref 8.9–10.3)
CO2: 28 mmol/L (ref 22–32)
Chloride: 101 mmol/L (ref 101–111)
Creatinine, Ser: 1.22 mg/dL (ref 0.61–1.24)
GFR calc Af Amer: >60 mL/min (ref >60)
GFR calc non Af Amer: >60 mL/min (ref >60)
Glucose, Bld: 114 mg/dL — ABNORMAL HIGH (ref 65–99)
Potassium: 4.1 mmol/L (ref 3.5–5.1)
Sodium: 138 mmol/L (ref 135–145)

## 2014-07-11 LAB — CBC
HCT: 40.4 % (ref 39.0–52.0)
HEMOGLOBIN: 14.4 g/dL (ref 13.0–17.0)
MCH: 31.4 pg (ref 26.0–34.0)
MCHC: 35.6 g/dL (ref 30.0–36.0)
MCV: 88 fL (ref 78.0–100.0)
Platelets: 213 10*3/uL (ref 150–400)
RBC: 4.59 MIL/uL (ref 4.22–5.81)
RDW: 13.2 % (ref 11.5–15.5)
WBC: 5 10*3/uL (ref 4.0–10.5)

## 2014-07-11 LAB — HEMOGLOBIN AND HEMATOCRIT, BLOOD
HCT: 40.7 % (ref 39.0–52.0)
HEMOGLOBIN: 14.1 g/dL (ref 13.0–17.0)

## 2014-07-11 LAB — MRSA PCR SCREENING: MRSA by PCR: NEGATIVE

## 2014-07-11 LAB — PLATELET INHIBITION P2Y12: Platelet Function  P2Y12: 64 [PRU] — ABNORMAL LOW (ref 194–418)

## 2014-07-11 LAB — PROTIME-INR
INR: 1.07 (ref 0.00–1.49)
INR: 1.09 (ref 0.00–1.49)
PROTHROMBIN TIME: 14.1 s (ref 11.6–15.2)
Prothrombin Time: 14.3 seconds (ref 11.6–15.2)

## 2014-07-11 LAB — TYPE AND SCREEN
ABO/RH(D): A POS
ANTIBODY SCREEN: NEGATIVE

## 2014-07-11 LAB — APTT
aPTT: 24 seconds (ref 24–37)
aPTT: 29 seconds (ref 24–37)

## 2014-07-11 LAB — ABO/RH: ABO/RH(D): A POS

## 2014-07-11 MED ORDER — LORATADINE 10 MG PO TABS
10.0000 mg | ORAL_TABLET | Freq: Every day | ORAL | Status: DC
  Administered 2014-07-11: 10 mg via ORAL
  Filled 2014-07-11 (×2): qty 1

## 2014-07-11 MED ORDER — LIDOCAINE HCL 1 % IJ SOLN
INTRAMUSCULAR | Status: AC
  Filled 2014-07-11: qty 20

## 2014-07-11 MED ORDER — CITALOPRAM HYDROBROMIDE 10 MG PO TABS
10.0000 mg | ORAL_TABLET | Freq: Every day | ORAL | Status: DC
  Administered 2014-07-12: 10 mg via ORAL
  Filled 2014-07-11: qty 1

## 2014-07-11 MED ORDER — TRAMADOL HCL 50 MG PO TABS: 50.0000 mg | ORAL_TABLET | Freq: Four times a day (QID) | ORAL | Status: DC | PRN

## 2014-07-11 MED ORDER — SODIUM CHLORIDE 0.9 % IV SOLN
Freq: Once | INTRAVENOUS | Status: AC
  Administered 2014-07-11: 10:00:00 via INTRAVENOUS

## 2014-07-11 MED ORDER — HYDRALAZINE HCL 20 MG/ML IJ SOLN
INTRAMUSCULAR | Status: AC
  Administered 2014-07-11: 5 mg
  Filled 2014-07-11: qty 1

## 2014-07-11 MED ORDER — NITROGLYCERIN 0.4 MG SL SUBL: 0.4000 mg | SUBLINGUAL_TABLET | SUBLINGUAL | Status: DC | PRN

## 2014-07-11 MED ORDER — CARVEDILOL 25 MG PO TABS
25.0000 mg | ORAL_TABLET | Freq: Two times a day (BID) | ORAL | Status: DC
  Administered 2014-07-11 – 2014-07-12 (×2): 25 mg via ORAL
  Filled 2014-07-11 (×3): qty 1

## 2014-07-11 MED ORDER — PANTOPRAZOLE SODIUM 40 MG PO TBEC
40.0000 mg | DELAYED_RELEASE_TABLET | Freq: Every day | ORAL | Status: DC
  Administered 2014-07-11 – 2014-07-12 (×2): 40 mg via ORAL
  Filled 2014-07-11 (×2): qty 1

## 2014-07-11 MED ORDER — MIDAZOLAM HCL 2 MG/2ML IJ SOLN
INTRAMUSCULAR | Status: AC | PRN
  Administered 2014-07-11: 1 mg via INTRAVENOUS

## 2014-07-11 MED ORDER — IOHEXOL 300 MG/ML  SOLN
150.0000 mL | Freq: Once | INTRAMUSCULAR | Status: AC | PRN
  Administered 2014-07-11: 60 mL via INTRA_ARTERIAL

## 2014-07-11 MED ORDER — FENTANYL CITRATE (PF) 100 MCG/2ML IJ SOLN
INTRAMUSCULAR | Status: AC
Start: 2014-07-11 — End: 2014-07-11
  Filled 2014-07-11: qty 2

## 2014-07-11 MED ORDER — CLOPIDOGREL BISULFATE 75 MG PO TABS
75.0000 mg | ORAL_TABLET | Freq: Every day | ORAL | Status: DC
  Filled 2014-07-11: qty 1

## 2014-07-11 MED ORDER — ASPIRIN 81 MG PO CHEW
81.0000 mg | CHEWABLE_TABLET | Freq: Every day | ORAL | Status: DC
  Administered 2014-07-11 – 2014-07-12 (×2): 81 mg via ORAL
  Filled 2014-07-11 (×2): qty 1

## 2014-07-11 MED ORDER — HEPARIN SODIUM (PORCINE) 1000 UNIT/ML IJ SOLN
INTRAMUSCULAR | Status: AC | PRN
  Administered 2014-07-11: 1000 [IU] via INTRAVENOUS

## 2014-07-11 MED ORDER — GABAPENTIN 300 MG PO CAPS
300.0000 mg | ORAL_CAPSULE | Freq: Every day | ORAL | Status: DC
  Administered 2014-07-11: 300 mg via ORAL
  Filled 2014-07-11 (×2): qty 1

## 2014-07-11 MED ORDER — SODIUM CHLORIDE 0.9 % IV SOLN: INTRAVENOUS | Status: DC

## 2014-07-11 MED ORDER — SPIRONOLACTONE 12.5 MG HALF TABLET
12.5000 mg | ORAL_TABLET | Freq: Every day | ORAL | Status: DC
  Administered 2014-07-12: 12.5 mg via ORAL
  Filled 2014-07-11: qty 1

## 2014-07-11 MED ORDER — LOSARTAN POTASSIUM 25 MG PO TABS
25.0000 mg | ORAL_TABLET | Freq: Two times a day (BID) | ORAL | Status: DC
  Administered 2014-07-11 – 2014-07-12 (×2): 25 mg via ORAL
  Filled 2014-07-11 (×3): qty 1

## 2014-07-11 MED ORDER — HEPARIN SOD (PORK) LOCK FLUSH 100 UNIT/ML IV SOLN
INTRAVENOUS | Status: AC
  Filled 2014-07-11: qty 15

## 2014-07-11 MED ORDER — HYDRALAZINE HCL 20 MG/ML IJ SOLN
5.0000 mg | Freq: Once | INTRAMUSCULAR | Status: AC
  Administered 2014-07-11: 5 mg via INTRAVENOUS

## 2014-07-11 MED ORDER — MIDAZOLAM HCL 2 MG/2ML IJ SOLN
INTRAMUSCULAR | Status: AC
  Filled 2014-07-11: qty 2

## 2014-07-11 MED ORDER — POTASSIUM CHLORIDE CRYS ER 20 MEQ PO TBCR
40.0000 meq | EXTENDED_RELEASE_TABLET | Freq: Every day | ORAL | Status: DC
  Administered 2014-07-12: 40 meq via ORAL
  Filled 2014-07-11: qty 2

## 2014-07-11 MED ORDER — FUROSEMIDE 40 MG PO TABS
40.0000 mg | ORAL_TABLET | Freq: Every day | ORAL | Status: DC
  Administered 2014-07-12: 40 mg via ORAL
  Filled 2014-07-11: qty 1

## 2014-07-11 MED ORDER — FENTANYL CITRATE (PF) 100 MCG/2ML IJ SOLN
INTRAMUSCULAR | Status: AC | PRN
  Administered 2014-07-11: 25 ug via INTRAVENOUS

## 2014-07-11 MED ORDER — SODIUM CHLORIDE 0.9 % IV SOLN: INTRAVENOUS | Status: AC

## 2014-07-11 MED ORDER — SUCRALFATE 1 G PO TABS
1.0000 g | ORAL_TABLET | Freq: Two times a day (BID) | ORAL | Status: DC
  Administered 2014-07-11 – 2014-07-12 (×2): 1 g via ORAL
  Filled 2014-07-11 (×3): qty 1

## 2014-07-11 NOTE — Progress Notes (Signed)
Right groin oozing and Pam Turpin,PA notified

## 2014-07-11 NOTE — Progress Notes (Signed)
Patient ID: Isaac Johnson, male   DOB: 1965/09/16, 49 y.o.   MRN: [id number]   Cerebral arteriogram with Dr Estanislado Pandy today  Pt continues to have slight ooze from Rt groin New dressing placed RN has held pressure over 30 minutes Sand bag to Rt groin x 1hr Still oozing  New Vpad placed with additional pressure held x 20 mins  Call to Dr Estanislado Pandy BP 141/97 P 68 Lying flat Rt foot 2+ pulses  Will admit overnight - 24 hr obs Flat bedrest 24 hrs  Orders in place  Will check in am Plan for dc in am   Pt and wife aware and agreeable

## 2014-07-11 NOTE — Progress Notes (Signed)
Dr Estanislado Pandy in to see client; report call to nurse for 2W23 and transferred via bed

## 2014-07-11 NOTE — H&P (Signed)
Chief Complaint: L MCA clot retrieval/stent 05/2013  Referring Physician(s): Dr Mike Craze  History of Present Illness: Isaac Johnson is a 49 y.o. male   CVA 05/2013 L middle cerebral artery clot retrieval and stent placement 05/2013 On ASA/Plavix Now for follow up arteriogram Pt continues to have slight R facial droop Still with weakness of R side Nonsmoker  Past Medical History  Diagnosis Date  . Hypertension   . Stomach ulcer   . Gait instability   . Proteinuria   . GERD (gastroesophageal reflux disease)   . Insomnia   . Dizziness   . Leukopenia   . Sciatica     left  . Brain lipoma 2010    Dr. Trenton Gammon  . CHF (congestive heart failure)   . Acute systolic heart failure   . Cardiomyopathy   . CVA (cerebral infarction)   . Shortness of breath   . Anxiety   . Stroke     Past Surgical History  Procedure Laterality Date  . Radiology with anesthesia N/A 05/07/2013    Procedure: RADIOLOGY WITH ANESTHESIA;  Surgeon: Rob Hickman, MD;  Location: [facility];  Service: Radiology;  Laterality: N/A;  . Cardiac catheterization  05/23/2013  . Mca stent    . Left and right heart catheterization with coronary angiogram Bilateral 05/23/2013    Procedure: LEFT AND RIGHT HEART CATHETERIZATION WITH CORONARY ANGIOGRAM;  Surgeon: Sanda Klein, MD;  Location: [facility] CATH [facility];  Service: Cardiovascular;  Laterality: Bilateral;    Allergies: Review of patient's allergies indicates no known allergies.  Medications: Prior to Admission medications   Medication Sig Start Date End Date Taking? Authorizing Provider  aspirin 81 MG EC tablet Take 1 tablet (81 mg total) by mouth daily. 06/07/13  Yes Daniel J Angiulli, PA-C  carvedilol (COREG) 25 MG tablet Take 1 tablet (25 mg total) by mouth 2 (two) times daily. Patient taking differently: Take 50 mg by mouth 2 (two) times daily with a meal.  10/18/13  Yes Arnoldo Lenis, MD  citalopram (CELEXA) 10 MG tablet Take 1 tablet (10 mg total) by  mouth daily. 06/07/13  Yes Daniel J Angiulli, PA-C  clopidogrel (PLAVIX) 75 MG tablet Take 1 tablet (75 mg total) by mouth daily with breakfast. 06/07/13  Yes Lavon Paganini Angiulli, PA-C  furosemide (LASIX) 40 MG tablet Take 1 tablet (40 mg total) by mouth daily. 06/07/13  Yes Daniel J Angiulli, PA-C  gabapentin (NEURONTIN) 300 MG capsule Take 1 capsule (300 mg total) by mouth at bedtime. 06/07/13  Yes Daniel J Angiulli, PA-C  loratadine (CLARITIN) 10 MG tablet Take 1 tablet (10 mg total) by mouth at bedtime. 06/07/13  Yes Daniel J Angiulli, PA-C  losartan (COZAAR) 25 MG tablet Take 1 tablet (25 mg total) by mouth 2 (two) times daily. 06/07/13  Yes Daniel J Angiulli, PA-C  nitroGLYCERIN (NITROSTAT) 0.4 MG SL tablet Place 1 tablet (0.4 mg total) under the tongue every 5 (five) minutes x 3 doses as needed for chest pain. 06/07/13  Yes Daniel J Angiulli, PA-C  omeprazole (PRILOSEC) 20 MG capsule Take 1 capsule (20 mg total) by mouth daily. 06/07/13  Yes Daniel J Angiulli, PA-C  potassium chloride SA (K-DUR,KLOR-CON) 20 MEQ tablet Take 2 tablets (40 mEq total) by mouth daily. 06/07/13  Yes Daniel J Angiulli, PA-C  spironolactone (ALDACTONE) 12.5 mg TABS tablet Take 0.5 tablets (12.5 mg total) by mouth daily. 06/07/13  Yes Daniel J Angiulli, PA-C  sucralfate (CARAFATE) 1 G tablet Take 1  tablet (1 g total) by mouth 2 (two) times daily. 06/07/13  Yes Daniel J Angiulli, PA-C  traMADol (ULTRAM) 50 MG tablet Take 50 mg by mouth every 6 (six) hours as needed for moderate pain.    Yes Historical Provider, MD     Family History  Problem Relation Age of Onset  . Stroke Mother 70  . Gout Father   . Hypertension Maternal Grandmother   . Stroke Maternal Grandmother   . Diabetes Maternal Grandmother     History   Social History  . Marital Status: Married    Spouse Name: N/A  . Number of Children: N/A  . Years of Education: N/A   Social History Main Topics  . Smoking status: Never Smoker   . Smokeless tobacco: Never Used  .  Alcohol Use: No  . Drug Use: No  . Sexual Activity: Not on file   Other Topics Concern  . None   Social History Narrative     Review of Systems: A 12 point ROS discussed and pertinent positives are indicated in the HPI above.  All other systems are negative.  Review of Systems  Constitutional: Positive for fatigue. Negative for fever, activity change and appetite change.  HENT: Negative for tinnitus, trouble swallowing and voice change.   Neurological: Positive for facial asymmetry, weakness and numbness. Negative for dizziness, tremors, seizures, syncope, speech difficulty, light-headedness and headaches.  Psychiatric/Behavioral: Negative for behavioral problems and confusion.    Vital Signs: BP 145/93 mmHg  Pulse 73  Temp(Src) 98.3 F (36.8 C)  Ht 6' (1.829 m)  Wt 88.451 kg (195 lb)  BMI 26.44 kg/m2  SpO2 100%  Physical Exam  Constitutional: He is oriented to person, place, and time. He appears well-nourished.  HENT:  Rt facial droop  Neck: Normal range of motion.  Cardiovascular: Normal rate, regular rhythm and normal heart sounds.   No murmur heard. Pulmonary/Chest: Effort normal and breath sounds normal. He has no wheezes.  Abdominal: Soft. Bowel sounds are normal. He exhibits no distension.  Musculoskeletal: Normal range of motion.  Definite weaker on Rt Able to write   Neurological: He is alert and oriented to person, place, and time.  Skin: Skin is warm and dry.  Psychiatric: He has a normal mood and affect. His behavior is normal. Judgment and thought content normal.  Nursing note and vitals reviewed.   Mallampati Score:  MD Evaluation Airway: WNL Heart: WNL Abdomen: WNL Chest/ Lungs: WNL ASA  Classification: 3 Mallampati/Airway Score: Two  Imaging: No results found.  Labs:  CBC: No results for input(s): WBC, HGB, HCT, PLT in the last 8760 hours.  COAGS: No results for input(s): INR, APTT in the last 8760 hours.  BMP: No results for  input(s): NA, K, CL, CO2, GLUCOSE, BUN, CALCIUM, CREATININE, GFRNONAA, GFRAA in the last 8760 hours.  Invalid input(s): CMP  LIVER FUNCTION TESTS: No results for input(s): BILITOT, AST, ALT, ALKPHOS, PROT, ALBUMIN in the last 8760 hours.  TUMOR MARKERS: No results for input(s): AFPTM, CEA, CA199, CHROMGRNA in the last 8760 hours.  Assessment and Plan:  CVA L MCA clot retrieval and stent 05/2013 Now scheduled for follow up cerebral arteriogram Risks and Benefits discussed with the patient including, but not limited to bleeding, infection, vascular injury, contrast induced renal failure, stroke or even death. All of the patient's questions were answered, patient is agreeable to proceed. Consent signed and in chart.  Thank you for this interesting consult.  I greatly enjoyed meeting Isaac L  Johnson and look forward to participating in their care.  Signed: Kosisochukwu Goldberg A 07/11/2014, 10:00 AM   I spent a total of  20 Minutes   in face to face in clinical consultation, greater than 50% of which was counseling/coordinating care for cerebral arteriogram

## 2014-07-11 NOTE — Progress Notes (Signed)
Admitted from short stay by bed awake and alert. Right groin v-pad dry and intact. Instructed to be on bed rest, not to bend right knee and to call for  any signs of bleeding.

## 2014-07-11 NOTE — Procedures (Signed)
S/P  4 vessel cerebral arteriogram. Rt CFa approach. Findings. 1.Patent Lt MCa prox and the stented inf division. 2.Approx 50 stenosis of Lt MCA sup division

## 2014-07-11 NOTE — Progress Notes (Signed)
Patient ID: Isaac Johnson, male   DOB: 1965-09-13, 49 y.o.   MRN: [id number] Patient S/P cerebral arteriogram via RT CFA approach. This am ,without any complications. Later in the the short stay note to have persistent oozing from the femoral puncture site.Pressur held for at least 30 mins with eventual control. And hemostasis. Presently RT groin soft with no palpable hematoma.No tenderness to palpation. Distal pulses intact. VSS BP 140s/70s Neurologically patient unchanged. Earliear given 5mg  of hydralazine  for diastoloc BP in the 90s. Plan . 1Will admit overnight for close observation and complete bed rest with RT leg straight,also when using the urinal. 2.Should oozing recur will place 10 lb sandbag and consider transfusion of x1 unit of platelets. 3.Will group and Xmatch. Stat PT<PTT<and INR drawn. 4.Patient may have sips of water with crackers. 5.Discussed with patient and spouse. Dr Estanislado Pandy MD

## 2014-07-11 NOTE — Progress Notes (Signed)
Called Dr Estanislado Pandy and notified him of oozing right groin and that pressure has been held for one hour and continued oozing; orders noted

## 2014-07-11 NOTE — Progress Notes (Signed)
Pam Turpin,PA notified of right groin oozing and per Pam Turpin,PA 10lb sandbag placed on right groin

## 2014-07-12 DIAGNOSIS — I6602 Occlusion and stenosis of left middle cerebral artery: Secondary | ICD-10-CM | POA: Diagnosis not present

## 2014-07-12 LAB — CBC
HCT: 39.7 % (ref 39.0–52.0)
HEMOGLOBIN: 13.5 g/dL (ref 13.0–17.0)
MCH: 30.4 pg (ref 26.0–34.0)
MCHC: 34 g/dL (ref 30.0–36.0)
MCV: 89.4 fL (ref 78.0–100.0)
PLATELETS: 214 10*3/uL (ref 150–400)
RBC: 4.44 MIL/uL (ref 4.22–5.81)
RDW: 13.4 % (ref 11.5–15.5)
WBC: 5.9 10*3/uL (ref 4.0–10.5)

## 2014-07-12 MED ORDER — CLOPIDOGREL BISULFATE 75 MG PO TABS
37.5000 mg | ORAL_TABLET | Freq: Once | ORAL | Status: AC
  Administered 2014-07-12: 37.5 mg via ORAL
  Filled 2014-07-12: qty 1

## 2014-07-12 NOTE — Discharge Summary (Signed)
Patient ID: Isaac Johnson MRN: [id number] DOB/AGE: 08-14-1965 49 y.o.  Admit date: 07/11/2014 Discharge date: 07/12/2014  Admission Diagnoses: CVA  Discharge Diagnoses:  Active Problems:   CVA (cerebral vascular accident) S/p diagnostic cerebral arteriogram with right CFA groin access site ooze, resolved.   Discharged Condition: Good, stable.   Hospital Course: DIMITRY HOLSWORTH is a 49 y.o. Male with history of CVA 05/2013, L middle cerebral artery clot retrieval and stent placement 05/2013 taking ASA 81 mg /Plavix 75 mg daily. He presented as an outpatient for an elective cerebral arteriogram on 07/11/14. He underwent the procedure without complications, findings include patent left MCA prox and the stented inf division and approximately 50 % stenosis of Left MCA superior division. In short stay his right groin access site was noted to have continued ooze despite 10 lb sandbag and manual pressure. V-pad and additional manual pressure was performed and hemostasis was achieved.  The patient was admitted overnight for continued bedrest and observation. He had no complications overnight and denies any right groin site pain, swelling or bleeding. He has ate breakfast without N/V. He has ambulated the halls without lightheadedness or dizziness and no signs of right groin site bleeding or hematoma. This has been discussed today with Dr. Estanislado Pandy who agrees the patient is stable for discharge home. P2Y12 this admission was 49 and the patient was instructed to change his Plavix to 37.5 mg daily and continue ASA 81mg  and all other medications. He states understanding. He will F/U in 6 months for a catheter arteriogram.   Significant Diagnostic Studies: Cerebral arteriogram   Discharge Exam: Blood pressure 111/44, pulse 80, temperature 97.9 F (36.6 C), temperature source Oral, resp. rate 18, height 6' (1.829 m), weight 192 lb 3.9 oz (87.2 kg), SpO2 99 %.  General: A&Ox3, NAD Heart: RRR Lungs: CTA b/l Abd:  Soft, NT, ND Ext: RCFA access site dressing C/D/I soft, NT, no signs of bleeding or hematoma, DP 2+ B/L  Disposition: 01-Home or Self Care  Discharge Instructions    Call MD for:  difficulty breathing, headache or visual disturbances    Complete by:  As directed      Call MD for:  extreme fatigue    Complete by:  As directed      Call MD for:  hives    Complete by:  As directed      Call MD for:  persistant dizziness or light-headedness    Complete by:  As directed      Call MD for:  persistant nausea and vomiting    Complete by:  As directed      Call MD for:  redness, tenderness, or signs of infection (pain, swelling, redness, odor or green/yellow discharge around incision site)    Complete by:  As directed      Call MD for:  severe uncontrolled pain    Complete by:  As directed      Call MD for:  temperature >100.4    Complete by:  As directed      Diet - low sodium heart healthy    Complete by:  As directed      Driving Restrictions    Complete by:  As directed   No driving x 3 days     Increase activity slowly    Complete by:  As directed      Lifting restrictions    Complete by:  As directed   No lifting over 20 lbs x 1  week     Remove dressing in 24 hours    Complete by:  As directed             Medication List    TAKE these medications        aspirin 81 MG EC tablet  Take 1 tablet (81 mg total) by mouth daily.     carvedilol 25 MG tablet  Commonly known as:  COREG  Take 1 tablet (25 mg total) by mouth 2 (two) times daily.     citalopram 10 MG tablet  Commonly known as:  CELEXA  Take 1 tablet (10 mg total) by mouth daily.     clopidogrel 75 MG tablet  Commonly known as:  PLAVIX  Take 1 tablet (75 mg total) by mouth daily with breakfast.     furosemide 40 MG tablet  Commonly known as:  LASIX  Take 1 tablet (40 mg total) by mouth daily.     gabapentin 300 MG capsule  Commonly known as:  NEURONTIN  Take 1 capsule (300 mg total) by mouth at bedtime.       loratadine 10 MG tablet  Commonly known as:  CLARITIN  Take 1 tablet (10 mg total) by mouth at bedtime.     losartan 25 MG tablet  Commonly known as:  COZAAR  Take 1 tablet (25 mg total) by mouth 2 (two) times daily.     nitroGLYCERIN 0.4 MG SL tablet  Commonly known as:  NITROSTAT  Place 1 tablet (0.4 mg total) under the tongue every 5 (five) minutes x 3 doses as needed for chest pain.     omeprazole 20 MG capsule  Commonly known as:  PRILOSEC  Take 1 capsule (20 mg total) by mouth daily.     potassium chloride SA 20 MEQ tablet  Commonly known as:  K-DUR,KLOR-CON  Take 2 tablets (40 mEq total) by mouth daily.     spironolactone 12.5 mg Tabs tablet  Commonly known as:  ALDACTONE  Take 0.5 tablets (12.5 mg total) by mouth daily.     sucralfate 1 G tablet  Commonly known as:  CARAFATE  Take 1 tablet (1 g total) by mouth 2 (two) times daily.     traMADol 50 MG tablet  Commonly known as:  ULTRAM  Take 50 mg by mouth every 6 (six) hours as needed for moderate pain.           Follow-up Information    Follow up with DEVESHWAR, Fritz Pickerel, MD In 6 months.   Specialty:  Interventional Radiology   Why:  Our office will call with appointment [phone]   Contact information:   [address] [place] [address] [phone]        Signed: Hedy Jacob 07/12/2014, 11:59 AM   I have spent Greater Than 30 Minutes discharging Morris.

## 2014-11-09 ENCOUNTER — Ambulatory Visit (INDEPENDENT_AMBULATORY_CARE_PROVIDER_SITE_OTHER): Payer: 59 | Admitting: Cardiology

## 2014-11-09 ENCOUNTER — Encounter: Payer: Self-pay | Admitting: Cardiology

## 2014-11-09 VITALS — BP 136/88 | HR 78 | Ht 72.0 in | Wt 205.0 lb

## 2014-11-09 DIAGNOSIS — I5022 Chronic systolic (congestive) heart failure: Secondary | ICD-10-CM

## 2014-11-09 DIAGNOSIS — I10 Essential (primary) hypertension: Secondary | ICD-10-CM

## 2014-11-09 MED ORDER — LOSARTAN POTASSIUM 50 MG PO TABS: 50.0000 mg | ORAL_TABLET | Freq: Two times a day (BID) | ORAL | Status: DC

## 2014-11-09 NOTE — Progress Notes (Signed)
Patient ID: Isaac Johnson, male   DOB: December 29, 1965, 49 y.o.   MRN: [id number]     Clinical Summary Isaac Johnson is a 50 y.o.male seen today for follow up of the following medical problems.   1. Chronic systolic heart failure  - NICM, cath 05/2013 with patent coronaries. RHC with RA 8, mean PA 26, PCWP 21, CI 3.1  - echo 05/2013 LVEF 25%, diffuse hypokinesis, grade II diastolic dysfunction.  - from prior notes discussions about cardiac MRI to evaluate for infiltrative disease   - denies any LE edema, no DOE, no orthopnea  - last visit increased coreg to 25 mg bid, tolerated well.   2. HTN  - does not check at home.  - compliant with meds   3. CVA  -  admit 05/2013 with CVA, received tPA and underwent left MCA revasc with clot retrieval by IR and stent placement - has completed rehab - he is on plavix 37.5mg  and ASA 81 per neuro.   4. OSA screening  + snoring + apneic episodes. Mild daytime somnolence - he has not yet followed up with sleep medicine Past Medical History  Diagnosis Date  . Hypertension   . Stomach ulcer   . Gait instability   . Proteinuria   . GERD (gastroesophageal reflux disease)   . Insomnia   . Dizziness   . Leukopenia   . Sciatica     left  . Brain lipoma 2010    Dr. Trenton Gammon  . CHF (congestive heart failure) ([place])   . Acute systolic heart failure ([place])   . Cardiomyopathy ([place])   . CVA (cerebral infarction)   . Shortness of breath   . Anxiety   . Stroke [facility])      No Known Allergies   Current Outpatient Prescriptions  Medication Sig Dispense Refill  . aspirin 81 MG EC tablet Take 1 tablet (81 mg total) by mouth daily. 30 tablet 12  . carvedilol (COREG) 25 MG tablet Take 1 tablet by mouth 2 (two) times daily.  0  . clonazePAM (KLONOPIN) 0.5 MG tablet Take 0.5 mg by mouth 3 (three) times daily as needed.  2  . clopidogrel (PLAVIX) 75 MG tablet Take 37.5 mg by mouth daily.  3  . furosemide (LASIX) 40 MG tablet Take 1 tablet (40 mg total) by  mouth daily. 30 tablet 1  . gabapentin (NEURONTIN) 300 MG capsule Take 1 capsule (300 mg total) by mouth at bedtime. 30 capsule 1  . losartan (COZAAR) 25 MG tablet Take 1 tablet (25 mg total) by mouth 2 (two) times daily. 60 tablet 1  . nitroGLYCERIN (NITROSTAT) 0.4 MG SL tablet Place 1 tablet (0.4 mg total) under the tongue every 5 (five) minutes x 3 doses as needed for chest pain. 30 tablet 12  . omeprazole (PRILOSEC) 20 MG capsule Take 1 capsule (20 mg total) by mouth daily. 30 capsule 1  . potassium chloride SA (K-DUR,KLOR-CON) 20 MEQ tablet Take 2 tablets (40 mEq total) by mouth daily. 60 tablet 1  . spironolactone (ALDACTONE) 12.5 mg TABS tablet Take 0.5 tablets (12.5 mg total) by mouth daily.    . sucralfate (CARAFATE) 1 G tablet Take 1 tablet (1 g total) by mouth 2 (two) times daily. 60 tablet 1  . traMADol (ULTRAM) 50 MG tablet Take 50 mg by mouth every 6 (six) hours as needed for moderate pain.     . citalopram (CELEXA) 10 MG tablet Take 1 tablet (10 mg total) by mouth  daily. (Patient not taking: Reported on 11/09/2014) 30 tablet 1   No current facility-administered medications for this visit.     Past Surgical History  Procedure Laterality Date  . Radiology with anesthesia N/A 05/07/2013    Procedure: RADIOLOGY WITH ANESTHESIA;  Surgeon: Rob Hickman, MD;  Location: [facility];  Service: Radiology;  Laterality: N/A;  . Cardiac catheterization  05/23/2013  . Mca stent    . Left and right heart catheterization with coronary angiogram Bilateral 05/23/2013    Procedure: LEFT AND RIGHT HEART CATHETERIZATION WITH CORONARY ANGIOGRAM;  Surgeon: Sanda Klein, MD;  Location: [facility] CATH LAB;  Service: Cardiovascular;  Laterality: Bilateral;     No Known Allergies    Family History  Problem Relation Age of Onset  . Stroke Mother 34  . Gout Father   . Hypertension Maternal Grandmother   . Stroke Maternal Grandmother   . Diabetes Maternal Grandmother      Social History Mr. Volante  reports that he has never smoked. He has never used smokeless tobacco. Mr. Desroches reports that he does not drink alcohol.   Review of Systems CONSTITUTIONAL: No weight loss, fever, chills, weakness or fatigue.  HEENT: Eyes: No visual loss, blurred vision, double vision or yellow sclerae.No hearing loss, sneezing, congestion, runny nose or sore throat.  SKIN: No rash or itching.  CARDIOVASCULAR: per hpi RESPIRATORY: No shortness of breath, cough or sputum.  GASTROINTESTINAL: No anorexia, nausea, vomiting or diarrhea. No abdominal pain or blood.  GENITOURINARY: No burning on urination, no polyuria NEUROLOGICAL: No headache, dizziness, syncope, paralysis, ataxia, numbness or tingling in the extremities. No change in bowel or bladder control.  MUSCULOSKELETAL: No muscle, back pain, joint pain or stiffness.  LYMPHATICS: No enlarged nodes. No history of splenectomy.  PSYCHIATRIC: No history of depression or anxiety.  ENDOCRINOLOGIC: No reports of sweating, cold or heat intolerance. No polyuria or polydipsia.  [name]   Physical Examination Filed Vitals:   11/09/14 1525  BP: 136/88  Pulse: 78   Filed Weights   11/09/14 1525  Weight: 205 lb (92.987 kg)    Gen: resting comfortably, no acute distress HEENT: no scleral icterus, pupils equal round and reactive, no palptable cervical adenopathy,  CV: RRR, no m/r/g, no jvd Resp: Clear to auscultation bilaterally GI: abdomen is soft, non-tender, non-distended, normal bowel sounds, no hepatosplenomegaly MSK: extremities are warm, no edema.  Skin: warm, no rash Neuro:  no focal deficits Psych: appropriate affect   Diagnostic Studies 05/2013 Cath  Angiographic Findings:  1. The left main coronary artery is free of significant atherosclerosis and bifurcates in the usual fashion into the left anterior descending artery and left circumflex coronary artery.  2. The left anterior descending artery is a large vessel that reaches the apex and  generates one major diagonal Elleigh Cassetta. There is evidence of no luminal irregularities and no calcification. No hemodynamically meaningful stenoses are seen.  3. The left circumflex coronary artery is a large-size vessel non dominant vessel that generates three major oblique marginal arteries, the more distal being by far the largest. There is evidence of no luminal irregularities and no calcification. No hemodynamically meaningful stenoses are seen.  4. The right coronary artery is a medium-size dominant vessel that generates only a posterior descending artery. There is evidence of no luminal irregularities and no calcification. No hemodynamically meaningful stenoses are seen.  5. The left ventricle is normal in size. The left ventricle systolic function is severely decreased with an estimated ejection fraction of 25%. Regional  wall motion abnormalities are not seen. No left ventricular thrombus is seen. There is moderate mitral insufficiency, but there was probably catheter entanglement in the subvalvular apparatus.. The ascending aorta appears normal. There is no aortic valve stenosis by pullback. The left ventricular end-diastolic pressure is 18 mm Hg.   Hemodynamic findings:  Aortic pressure 137/79 (mean 102 ) mm Hg  Left ventricle 734/1 with end-diastolic pressure of 18 mm Hg  PA wedge pressure a wave 21, v wave 20 (mean 16) mm Hg  Pulmonary artery 40/16 (mean 26) mm Hg  Right ventricle 37/2 with an end-diastolic pressure of 5 mm Hg  Right atrium a wave 8, v wave 7 (mean 5) mm Hg  Cardiac output is 6 L per minute (cardiac index 3.1 L per minute per meter sq)  Oxygen saturation: Aortic 96%, pulmonary artery 73%  SVR 1289 dsc, SVRI 2630  PVR 133, PVRI 258  IMPRESSIONS:  Nonischemic cardiomyopathy, compensated.  Normal coronary arteries.  05/2013 Echo  Study Conclusions  - Left ventricle: The cavity size was mildly dilated. Wall thickness was normal. The estimated ejection  fraction was 25%. Diffuse hypokinesis. Features are consistent with a pseudonormal left ventricular filling pattern, with concomitant abnormal relaxation and increased filling pressure (grade 2 diastolic dysfunction). - Aortic valve: There was no stenosis. - Mitral valve: Trivial regurgitation. - Left atrium: The atrium was moderately dilated. - Right ventricle: The cavity size was normal. Systolic function was mildly reduced. - Right atrium: The atrium was mildly dilated. - Pulmonary arteries: No complete TR doppler jet so unable to estimate PA systolic pressure. - Systemic veins: IVC measured 2.3 cm with < 50% respirophasic variation, suggesting RA pressure 15 mmHg. - Pericardium, extracardiac: A trivial pericardial effusion was identified. Impressions:  - Mildly dilated LV with EF 25%, diffuse hypokinesis. Moderate diastolic dysfunction. Normal RV size with mildly decreased systolic function. No significant valvular abnormalities.             Assessment and Plan   1. Chronic systolic heart failure  - LVEF 25% by echo 05/2013, NYHA II, NICM. Does not have ICD as he is new diagnosis and undergoing trial of medical therapy  - will increase losartan to 50mg  bid, check BMET in 2 weeks.  - once optimized on medical therapy, reevaluate LVEF   2. HTN  - at goal, continue current meds  3. OSA screening  -upcoming appointment with sleep medicine   F/u 6 weeks   Arnoldo Lenis, M.D.

## 2014-11-09 NOTE — Patient Instructions (Signed)
Your physician recommends that you schedule a follow-up appointment in: [place] [address]. [place] physician has recommended you make the following change in your medication:   INCREASE LOSARTAN 44 MG 2 TIMES DAILY  Your physician recommends that you return for lab work in: 2 WEEKS BMP  Thank you for choosing [facility]!!

## 2014-11-23 ENCOUNTER — Encounter: Payer: Self-pay | Admitting: *Deleted

## 2014-11-27 ENCOUNTER — Telehealth: Payer: Self-pay | Admitting: *Deleted

## 2014-11-27 NOTE — Telephone Encounter (Signed)
Pt aware, routed to pcp 

## 2014-11-27 NOTE — Telephone Encounter (Signed)
-----   Message from Arnoldo Lenis, MD sent at 11/27/2014  1:04 PM EDT ----- Labs look good  Zandra Abts MD

## 2014-12-21 ENCOUNTER — Ambulatory Visit (INDEPENDENT_AMBULATORY_CARE_PROVIDER_SITE_OTHER): Payer: 59 | Admitting: Cardiology

## 2014-12-21 ENCOUNTER — Encounter: Payer: Self-pay | Admitting: *Deleted

## 2014-12-21 ENCOUNTER — Encounter: Payer: Self-pay | Admitting: Cardiology

## 2014-12-21 VITALS — BP 138/85 | HR 72 | Ht 72.0 in | Wt 208.2 lb

## 2014-12-21 DIAGNOSIS — I10 Essential (primary) hypertension: Secondary | ICD-10-CM

## 2014-12-21 DIAGNOSIS — I5022 Chronic systolic (congestive) heart failure: Secondary | ICD-10-CM | POA: Diagnosis not present

## 2014-12-21 NOTE — Patient Instructions (Addendum)
Your physician wants you to follow-up in: 1 month with Dr. Harl Bowie  Your physician recommends that you continue on your current medications as directed. Please refer to the Current Medication list given to you today.  Your physician has requested that you have an echocardiogram. Echocardiography is a painless test that uses sound waves to create images of your heart. It provides your doctor with information about the size and shape of your heart and how well your heart's chambers and valves are working. This procedure takes approximately one hour. There are no restrictions for this procedure.  Thank you for choosing [facility]!!

## 2014-12-21 NOTE — Progress Notes (Signed)
Patient ID: Isaac Johnson, male   DOB: May 14, 1965, 49 y.o.   MRN: [id number]     Clinical Summary Isaac Johnson is a 49 y.o.male seen today for follow up of the following medical problems.   1. Chronic systolic heart failure  - NICM, cath 05/2013 with patent coronaries. RHC with RA 8, mean PA 26, PCWP 21, CI 3.1  - echo 05/2013 LVEF 25%, diffuse hypokinesis, grade II diastolic dysfunction.   - last visit increased losartan to 50mg  bid. Denies any new side effects - denies any SOB/DOE, no LE edema, no orthopnea, no pnd - compliant with meds - limiting sodium intake.   2. HTN  - does not check at home.  - compliant with meds   3. CVA  - admit 05/2013 with CVA, received tPA and underwent left MCA revasc with clot retrieval by IR and stent placement - has completed rehab - he is on plavix 37.5mg  and ASA 81 per neuro.   4. OSA screening  + snoring + apneic episodes. Mild daytime somnolence - he reports sleep study done at [facility] recently, he is unsure of results  Past Medical History  Diagnosis Date  . Hypertension   . Stomach ulcer   . Gait instability   . Proteinuria   . GERD (gastroesophageal reflux disease)   . Insomnia   . Dizziness   . Leukopenia   . Sciatica     left  . Brain lipoma 2010    Dr. Trenton Gammon  . CHF (congestive heart failure) ([place])   . Acute systolic heart failure ([place])   . Cardiomyopathy ([place])   . CVA (cerebral infarction)   . Shortness of breath   . Anxiety   . Stroke [facility])      No Known Allergies   Current Outpatient Prescriptions  Medication Sig Dispense Refill  . aspirin 81 MG EC tablet Take 1 tablet (81 mg total) by mouth daily. 30 tablet 12  . carvedilol (COREG) 25 MG tablet Take 1 tablet by mouth 2 (two) times daily.  0  . citalopram (CELEXA) 10 MG tablet Take 1 tablet (10 mg total) by mouth daily. (Patient not taking: Reported on 11/09/2014) 30 tablet 1  . clonazePAM (KLONOPIN) 0.5 MG tablet Take 0.5 mg by mouth 3 (three) times daily as  needed.  2  . clopidogrel (PLAVIX) 75 MG tablet Take 37.5 mg by mouth daily.  3  . furosemide (LASIX) 40 MG tablet Take 1 tablet (40 mg total) by mouth daily. 30 tablet 1  . gabapentin (NEURONTIN) 300 MG capsule Take 1 capsule (300 mg total) by mouth at bedtime. 30 capsule 1  . losartan (COZAAR) 50 MG tablet Take 1 tablet (50 mg total) by mouth 2 (two) times daily. 90 tablet 3  . nitroGLYCERIN (NITROSTAT) 0.4 MG SL tablet Place 1 tablet (0.4 mg total) under the tongue every 5 (five) minutes x 3 doses as needed for chest pain. 30 tablet 12  . omeprazole (PRILOSEC) 20 MG capsule Take 1 capsule (20 mg total) by mouth daily. 30 capsule 1  . potassium chloride SA (K-DUR,KLOR-CON) 20 MEQ tablet Take 2 tablets (40 mEq total) by mouth daily. 60 tablet 1  . spironolactone (ALDACTONE) 12.5 mg TABS tablet Take 0.5 tablets (12.5 mg total) by mouth daily.    . sucralfate (CARAFATE) 1 G tablet Take 1 tablet (1 g total) by mouth 2 (two) times daily. 60 tablet 1  . traMADol (ULTRAM) 50 MG tablet Take 50 mg by mouth every  6 (six) hours as needed for moderate pain.      No current facility-administered medications for this visit.     Past Surgical History  Procedure Laterality Date  . Radiology with anesthesia N/A 05/07/2013    Procedure: RADIOLOGY WITH ANESTHESIA;  Surgeon: Rob Hickman, MD;  Location: [place];  Service: Radiology;  Laterality: N/A;  . Cardiac catheterization  05/23/2013  . Mca stent    . Left and right heart catheterization with coronary angiogram Bilateral 05/23/2013    Procedure: LEFT AND RIGHT HEART CATHETERIZATION WITH CORONARY ANGIOGRAM;  Surgeon: Sanda Klein, MD;  Location: [place] CATH LAB;  Service: Cardiovascular;  Laterality: Bilateral;     No Known Allergies    Family History  Problem Relation Age of Onset  . Stroke Mother 58  . Gout Father   . Hypertension Maternal Grandmother   . Stroke Maternal Grandmother   . Diabetes Maternal Grandmother      Social  History Isaac Johnson reports that he has never smoked. He has never used smokeless tobacco. Isaac Johnson reports that he does not drink alcohol.   Review of Systems CONSTITUTIONAL: No weight loss, fever, chills, weakness or fatigue.  HEENT: Eyes: No visual loss, blurred vision, double vision or yellow sclerae.No hearing loss, sneezing, congestion, runny nose or sore throat.  SKIN: No rash or itching.  CARDIOVASCULAR: per hpi RESPIRATORY: No shortness of breath, cough or sputum.  GASTROINTESTINAL: No anorexia, nausea, vomiting or diarrhea. No abdominal pain or blood.  GENITOURINARY: No burning on urination, no polyuria NEUROLOGICAL: No headache, dizziness, syncope, paralysis, ataxia, numbness or tingling in the extremities. No change in bowel or bladder control.  MUSCULOSKELETAL: No muscle, back pain, joint pain or stiffness.  LYMPHATICS: No enlarged nodes. No history of splenectomy.  PSYCHIATRIC: No history of depression or anxiety.  ENDOCRINOLOGIC: No reports of sweating, cold or heat intolerance. No polyuria or polydipsia.  [name]   Physical Examination Filed Vitals:   12/21/14 1535  BP: 138/85  Pulse: 72   Filed Vitals:   12/21/14 1535  Height: 6' (1.829 m)  Weight: 208 lb 3.2 oz (94.439 kg)    Gen: resting comfortably, no acute distress HEENT: no scleral icterus, pupils equal round and reactive, no palptable cervical adenopathy,  CV: RRR, no m/r/g, no jvd Resp: Clear to auscultation bilaterally GI: abdomen is soft, non-tender, non-distended, normal bowel sounds, no hepatosplenomegaly MSK: extremities are warm, no edema.  Skin: warm, no rash Neuro:  no focal deficits Psych: appropriate affect   Diagnostic Studies 05/2013 Cath  Angiographic Findings:  1. The left main coronary artery is free of significant atherosclerosis and bifurcates in the usual fashion into the left anterior descending artery and left circumflex coronary artery.  2. The left anterior descending artery is a  large vessel that reaches the apex and generates one major diagonal branch. There is evidence of no luminal irregularities and no calcification. No hemodynamically meaningful stenoses are seen.  3. The left circumflex coronary artery is a large-size vessel non dominant vessel that generates three major oblique marginal arteries, the more distal being by far the largest. There is evidence of no luminal irregularities and no calcification. No hemodynamically meaningful stenoses are seen.  4. The right coronary artery is a medium-size dominant vessel that generates only a posterior descending artery. There is evidence of no luminal irregularities and no calcification. No hemodynamically meaningful stenoses are seen.  5. The left ventricle is normal in size. The left ventricle systolic function is severely decreased  with an estimated ejection fraction of 25%. Regional wall motion abnormalities are not seen. No left ventricular thrombus is seen. There is moderate mitral insufficiency, but there was probably catheter entanglement in the subvalvular apparatus.. The ascending aorta appears normal. There is no aortic valve stenosis by pullback. The left ventricular end-diastolic pressure is 18 mm Hg.   Hemodynamic findings:  Aortic pressure 137/79 (mean 102 ) mm Hg  Left ventricle [id number] with end-diastolic pressure of 18 mm Hg  PA wedge pressure a wave 21, v wave 20 (mean 16) mm Hg  Pulmonary artery 40/16 (mean 26) mm Hg  Right ventricle 37/2 with an end-diastolic pressure of 5 mm Hg  Right atrium a wave 8, v wave 7 (mean 5) mm Hg  Cardiac output is 6 L per minute (cardiac index 3.1 L per minute per meter sq)  Oxygen saturation: Aortic 96%, pulmonary artery 73%  SVR 1289 dsc, SVRI 2630  PVR 133, PVRI 258  IMPRESSIONS:  Nonischemic cardiomyopathy, compensated.  Normal coronary arteries.  05/2013 Echo  Study Conclusions  - Left ventricle: The cavity size was mildly dilated. Wall thickness  was normal. The estimated ejection fraction was 25%. Diffuse hypokinesis. Features are consistent with a pseudonormal left ventricular filling pattern, with concomitant abnormal relaxation and increased filling pressure (grade 2 diastolic dysfunction). - Aortic valve: There was no stenosis. - Mitral valve: Trivial regurgitation. - Left atrium: The atrium was moderately dilated. - Right ventricle: The cavity size was normal. Systolic function was mildly reduced. - Right atrium: The atrium was mildly dilated. - Pulmonary arteries: No complete TR doppler jet so unable to estimate PA systolic pressure. - Systemic veins: IVC measured 2.3 cm with < 50% respirophasic variation, suggesting RA pressure 15 mmHg. - Pericardium, extracardiac: A trivial pericardial effusion was identified. Impressions:  - Mildly dilated LV with EF 25%, diffuse hypokinesis. Moderate diastolic dysfunction. Normal RV size with mildly decreased systolic function. No significant valvular abnormalities.                  Assessment and Plan    1. Chronic systolic heart failure  - LVEF 25% by echo 05/2013, NYHA II, NICM. Does not have ICD as he is new diagnosis and undergoing trial of medical therapy  -will repeat echo as he is on maximal medical therapy. Pending LVEF, may need consideration for ICD  2. HTN  - at goal, continue current meds  3. OSA screening  - request results from recent sleep study   F/u 1 month    Arnoldo Lenis, M.D.

## 2015-01-04 ENCOUNTER — Ambulatory Visit (INDEPENDENT_AMBULATORY_CARE_PROVIDER_SITE_OTHER): Payer: 59

## 2015-01-04 DIAGNOSIS — I5022 Chronic systolic (congestive) heart failure: Secondary | ICD-10-CM

## 2015-01-16 ENCOUNTER — Telehealth: Payer: Self-pay | Admitting: *Deleted

## 2015-01-16 NOTE — Telephone Encounter (Signed)
Pt wife made aware, confirmed 02/06/15 appt, routed to pcp

## 2015-01-16 NOTE — Telephone Encounter (Signed)
-----   Message from Laurine Blazer, LPN sent at [id number] 10:29 AM EST -----   ----- Message -----    From: Arnoldo Lenis, MD    Sent: 01/12/2015  12:27 PM      To: Laurine Blazer, LPN  Echo shows heart function is much improved, and is in the low normal range. He will not need to have an ICD placed   Zandra Abts MD

## 2015-02-06 ENCOUNTER — Ambulatory Visit: Payer: 59 | Admitting: Cardiology

## 2015-02-07 ENCOUNTER — Telehealth (HOSPITAL_COMMUNITY): Payer: Self-pay

## 2015-02-07 NOTE — Telephone Encounter (Signed)
Called to schedule f/u angiogram, left message for pt to call back. AW

## 2015-04-17 ENCOUNTER — Other Ambulatory Visit: Payer: Self-pay | Admitting: *Deleted

## 2015-04-17 MED ORDER — LOSARTAN POTASSIUM 50 MG PO TABS: 50.0000 mg | ORAL_TABLET | Freq: Two times a day (BID) | ORAL | Status: DC

## 2015-04-20 ENCOUNTER — Telehealth: Payer: Self-pay | Admitting: *Deleted

## 2015-04-20 ENCOUNTER — Other Ambulatory Visit: Payer: Self-pay | Admitting: *Deleted

## 2015-04-20 MED ORDER — LOSARTAN POTASSIUM 50 MG PO TABS: 50.0000 mg | ORAL_TABLET | Freq: Two times a day (BID) | ORAL | Status: DC

## 2015-04-20 NOTE — Telephone Encounter (Signed)
PA for Losartan 50 mg bid sent to cover my meds and approved [id number] ref. #. Faxed approval to [facility] and pt was scheduled for f/u appt

## 2015-04-23 ENCOUNTER — Telehealth: Payer: Self-pay | Admitting: Cardiology

## 2015-04-23 NOTE — Telephone Encounter (Signed)
Please call patient in regards to the medication Losartan 50 mg bid

## 2015-04-24 NOTE — Telephone Encounter (Signed)
Resubmitted PA for correct quantity of 60 per month of losartan 50 mg bid. Will await approval and will contact pt with update.

## 2015-04-27 NOTE — Telephone Encounter (Signed)
Patient notified -   Massie Maroon, CMA at 04/20/2015 3:08 PM     Status: Signed       Expand All Collapse All   PA for Losartan 50 mg bid sent to cover my meds and approved [id number] ref. #. Faxed approval to [facility] and pt was scheduled for f/u appt

## 2015-04-27 NOTE — Telephone Encounter (Signed)
Patient has not heard back about medication and he is almost out

## 2015-05-04 ENCOUNTER — Ambulatory Visit (INDEPENDENT_AMBULATORY_CARE_PROVIDER_SITE_OTHER): Payer: BLUE CROSS/BLUE SHIELD | Admitting: Cardiology

## 2015-05-04 ENCOUNTER — Encounter: Payer: Self-pay | Admitting: Cardiology

## 2015-05-04 VITALS — BP 157/96 | HR 67 | Ht 72.0 in | Wt 204.0 lb

## 2015-05-04 DIAGNOSIS — I5022 Chronic systolic (congestive) heart failure: Secondary | ICD-10-CM | POA: Diagnosis not present

## 2015-05-04 DIAGNOSIS — I10 Essential (primary) hypertension: Secondary | ICD-10-CM | POA: Diagnosis not present

## 2015-05-04 MED ORDER — LOSARTAN POTASSIUM 50 MG PO TABS: 50.0000 mg | ORAL_TABLET | Freq: Two times a day (BID) | ORAL | Status: DC

## 2015-05-04 MED ORDER — ATORVASTATIN CALCIUM 40 MG PO TABS: 40.0000 mg | ORAL_TABLET | Freq: Every day | ORAL | Status: DC

## 2015-05-04 NOTE — Progress Notes (Signed)
Patient ID: Isaac Johnson, male   DOB: October 06, 1965, 50 y.o.   MRN: [id number]     Clinical Summary [name] is a 50 y.o.male seen today for follow up of the following medical problems.   1. Chronic systolic heart failure  - NICM, cath 05/2013 with patent coronaries. RHC with RA 8, mean PA 26, PCWP 21, CI 3.1  - echo 05/2013 LVEF 25%, diffuse hypokinesis, grade II diastolic dysfunction.  - echo 01/2015 LVEF [id number], grade I diastolic dysfunction  - denies any SOB or DOE. No LE edema - compliant with meds   2. HTN  - does not check at home.  - compliant with meds   3. CVA  - admit 05/2013 with CVA, received tPA and underwent left MCA revasc with clot retrieval by IR and stent placement - has completed rehab - he is on plavix 37.5mg  and ASA 81 per neuro.    Past Medical History  Diagnosis Date  . Hypertension   . Stomach ulcer   . Gait instability   . Proteinuria   . GERD (gastroesophageal reflux disease)   . Insomnia   . Dizziness   . Leukopenia   . Sciatica     left  . Brain lipoma 2010    Dr. Trenton Gammon  . CHF (congestive heart failure) ([place])   . Acute systolic heart failure ([place])   . Cardiomyopathy ([place])   . CVA (cerebral infarction)   . Shortness of breath   . Anxiety   . Stroke [facility])      No Known Allergies   Current Outpatient Prescriptions  Medication Sig Dispense Refill  . aspirin 81 MG EC tablet Take 1 tablet (81 mg total) by mouth daily. 30 tablet 12  . carvedilol (COREG) 25 MG tablet Take 1 tablet by mouth 2 (two) times daily.  0  . citalopram (CELEXA) 10 MG tablet Take 1 tablet (10 mg total) by mouth daily. 30 tablet 1  . clonazePAM (KLONOPIN) 0.5 MG tablet Take 0.5 mg by mouth 3 (three) times daily as needed.  2  . clopidogrel (PLAVIX) 75 MG tablet Take 37.5 mg by mouth daily.  3  . furosemide (LASIX) 40 MG tablet Take 1 tablet (40 mg total) by mouth daily. 30 tablet 1  . gabapentin (NEURONTIN) 300 MG capsule Take 1 capsule (300 mg total) by mouth at  bedtime. 30 capsule 1  . losartan (COZAAR) 50 MG tablet Take 1 tablet (50 mg total) by mouth 2 (two) times daily. 60 tablet 0  . nitroGLYCERIN (NITROSTAT) 0.4 MG SL tablet Place 1 tablet (0.4 mg total) under the tongue every 5 (five) minutes x 3 doses as needed for chest pain. 30 tablet 12  . omeprazole (PRILOSEC) 20 MG capsule Take 1 capsule (20 mg total) by mouth daily. 30 capsule 1  . potassium chloride SA (K-DUR,KLOR-CON) 20 MEQ tablet Take 2 tablets (40 mEq total) by mouth daily. 60 tablet 1  . spironolactone (ALDACTONE) 25 MG tablet Take 12.5 mg by mouth daily.    . sucralfate (CARAFATE) 1 G tablet Take 1 tablet (1 g total) by mouth 2 (two) times daily. 60 tablet 1  . traMADol (ULTRAM) 50 MG tablet Take 50 mg by mouth every 6 (six) hours as needed for moderate pain.      No current facility-administered medications for this visit.     Past Surgical History  Procedure Laterality Date  . Radiology with anesthesia N/A 05/07/2013    Procedure: RADIOLOGY WITH ANESTHESIA;  Surgeon: Rob Hickman, MD;  Location: [place];  Service: Radiology;  Laterality: N/A;  . Cardiac catheterization  05/23/2013  . Mca stent    . Left and right heart catheterization with coronary angiogram Bilateral 05/23/2013    Procedure: LEFT AND RIGHT HEART CATHETERIZATION WITH CORONARY ANGIOGRAM;  Surgeon: Sanda Klein, MD;  Location: [place] CATH LAB;  Service: Cardiovascular;  Laterality: Bilateral;     No Known Allergies    Family History  Problem Relation Age of Onset  . Stroke Mother 12  . Gout Father   . Hypertension Maternal Grandmother   . Stroke Maternal Grandmother   . Diabetes Maternal Grandmother      Social History [name] reports that he has never smoked. He has never used smokeless tobacco. [name] reports that he does not drink alcohol.   Review of Systems CONSTITUTIONAL: No weight loss, fever, chills, weakness or fatigue.  HEENT: Eyes: No visual loss, blurred vision, double vision or  yellow sclerae.No hearing loss, sneezing, congestion, runny nose or sore throat.  SKIN: No rash or itching.  CARDIOVASCULAR: per HPI RESPIRATORY: No shortness of breath, cough or sputum.  GASTROINTESTINAL: No anorexia, nausea, vomiting or diarrhea. No abdominal pain or blood.  GENITOURINARY: No burning on urination, no polyuria NEUROLOGICAL: No headache, dizziness, syncope, paralysis, ataxia, numbness or tingling in the extremities. No change in bowel or bladder control.  MUSCULOSKELETAL: No muscle, back pain, joint pain or stiffness.  LYMPHATICS: No enlarged nodes. No history of splenectomy.  PSYCHIATRIC: No history of depression or anxiety.  ENDOCRINOLOGIC: No reports of sweating, cold or heat intolerance. No polyuria or polydipsia.  [name]   Physical Examination Filed Vitals:   05/04/15 1404  BP: 157/96  Pulse: 67   Filed Vitals:   05/04/15 1404  Height: 6' (1.829 m)  Weight: 204 lb (92.534 kg)    Gen: resting comfortably, no acute distress HEENT: no scleral icterus, pupils equal round and reactive, no palptable cervical adenopathy,  CV: RRR, no m/r/g, no jvd Resp: Clear to auscultation bilaterally GI: abdomen is soft, non-tender, non-distended, normal bowel sounds, no hepatosplenomegaly MSK: extremities are warm, no edema.  Skin: warm, no rash Psych: appropriate affect   Diagnostic Studies  05/2013 Cath  Angiographic Findings:  1. The left main coronary artery is free of significant atherosclerosis and bifurcates in the usual fashion into the left anterior descending artery and left circumflex coronary artery.  2. The left anterior descending artery is a large vessel that reaches the apex and generates one major diagonal Hayle Parisi. There is evidence of no luminal irregularities and no calcification. No hemodynamically meaningful stenoses are seen.  3. The left circumflex coronary artery is a large-size vessel non dominant vessel that generates three major oblique marginal  arteries, the more distal being by far the largest. There is evidence of no luminal irregularities and no calcification. No hemodynamically meaningful stenoses are seen.  4. The right coronary artery is a medium-size dominant vessel that generates only a posterior descending artery. There is evidence of no luminal irregularities and no calcification. No hemodynamically meaningful stenoses are seen.  5. The left ventricle is normal in size. The left ventricle systolic function is severely decreased with an estimated ejection fraction of 25%. Regional wall motion abnormalities are not seen. No left ventricular thrombus is seen. There is moderate mitral insufficiency, but there was probably catheter entanglement in the subvalvular apparatus.. The ascending aorta appears normal. There is no aortic valve stenosis by pullback. The left ventricular end-diastolic  pressure is 18 mm Hg.   Hemodynamic findings:  Aortic pressure 137/79 (mean 102 ) mm Hg  Left ventricle [id number] with end-diastolic pressure of 18 mm Hg  PA wedge pressure a wave 21, v wave 20 (mean 16) mm Hg  Pulmonary artery 40/16 (mean 26) mm Hg  Right ventricle 37/2 with an end-diastolic pressure of 5 mm Hg  Right atrium a wave 8, v wave 7 (mean 5) mm Hg  Cardiac output is 6 L per minute (cardiac index 3.1 L per minute per meter sq)  Oxygen saturation: Aortic 96%, pulmonary artery 73%  SVR 1289 dsc, SVRI 2630  PVR 133, PVRI 258  IMPRESSIONS:  Nonischemic cardiomyopathy, compensated.  Normal coronary arteries.  05/2013 Echo  Study Conclusions  - Left ventricle: The cavity size was mildly dilated. Wall thickness was normal. The estimated ejection fraction was 25%. Diffuse hypokinesis. Features are consistent with a pseudonormal left ventricular filling pattern, with concomitant abnormal relaxation and increased filling pressure (grade 2 diastolic dysfunction). - Aortic valve: There was no stenosis. - Mitral valve: Trivial  regurgitation. - Left atrium: The atrium was moderately dilated. - Right ventricle: The cavity size was normal. Systolic function was mildly reduced. - Right atrium: The atrium was mildly dilated. - Pulmonary arteries: No complete TR doppler jet so unable to estimate PA systolic pressure. - Systemic veins: IVC measured 2.3 cm with < 50% respirophasic variation, suggesting RA pressure 15 mmHg. - Pericardium, extracardiac: A trivial pericardial effusion was identified. Impressions:  - Mildly dilated LV with EF 25%, diffuse hypokinesis. Moderate diastolic dysfunction. Normal RV size with mildly decreased systolic function. No significant valvular abnormalities.                     01/2015 echo Study Conclusions  - Left ventricle: Systolic function was difficult to assess but  appeared mildly reduced at best, estimated EF 45-50%. The cavity  size was normal. Images were inadequate for LV wall motion  assessment. Doppler parameters are consistent with abnormal left  ventricular relaxation (grade 1 diastolic dysfunction). - Aortic valve: Mildly calcified annulus. Trileaflet; mildly  thickened leaflets.  Impressions:  - Consider repeat limited echocardiogram with contrast enhancement  for a more accurate assessment of LVEF.  Assessment and Plan    1. Chronic systolic heart failure  -improved LVEF, now near normal at 45-50% - no current symptoms. Continue current meds  2. HTN  - elevated in clinic. He previously has been well controlled. He will return in one week for bp check, if persistently elevated will need med titration.   3. CVA - continue secondary prevention   F/u 1 month     Arnoldo Lenis, M.D.

## 2015-05-04 NOTE — Patient Instructions (Signed)
Your physician wants you to follow-up in: [place] DR. BRANCH You will receive a reminder letter in the mail two months in advance. If you don't receive a letter, please call our office to schedule the follow-up appointment.  Your physician has recommended you make the following change in your medication:   START LIPITOR 40 MG DAILY  NURSE VISIT BLOOD PRESSURE CHECK IN 1 WEEK  Thank you for choosing [facility]!!

## 2015-05-10 ENCOUNTER — Ambulatory Visit (INDEPENDENT_AMBULATORY_CARE_PROVIDER_SITE_OTHER): Payer: BLUE CROSS/BLUE SHIELD

## 2015-05-10 VITALS — BP 124/80 | HR 89 | Wt 204.0 lb

## 2015-05-10 DIAGNOSIS — Z136 Encounter for screening for cardiovascular disorders: Secondary | ICD-10-CM | POA: Diagnosis not present

## 2015-05-10 DIAGNOSIS — Z0130 Encounter for examination of blood pressure without abnormal findings: Secondary | ICD-10-CM

## 2015-05-10 NOTE — Progress Notes (Signed)
Pt came in for a blood pressure check. He did not have any complaints and stated he has been feeling well. He will keep his 6 months fu.

## 2015-05-10 NOTE — Patient Instructions (Signed)

## 2015-05-28 ENCOUNTER — Telehealth: Payer: Self-pay | Admitting: Cardiovascular Disease

## 2015-05-28 MED ORDER — LOSARTAN POTASSIUM 50 MG PO TABS: 50.0000 mg | ORAL_TABLET | Freq: Two times a day (BID) | ORAL | Status: AC

## 2015-05-28 NOTE — Telephone Encounter (Signed)
Done

## 2015-05-28 NOTE — Telephone Encounter (Signed)
losartan (COZAAR) 50 MG tablet refill needed [facility] in [place]

## 2015-11-24 IMAGING — XA IR VERTEBRAL  NON-SELECT UNILAT  RIGHT(MS)
1 series · 13 of 24 positions shown · IV contrast (IODINE)
Comparison: none

CLINICAL DATA: Left middle cerebral artery occlusion. Status post
endovascular revascularization with mechanical thrombectomy and
placement of an intracranial stent in the left middle cerebral
artery.

[Series 300: neuro · 13 of 111 slices shown]
[im 1/111]
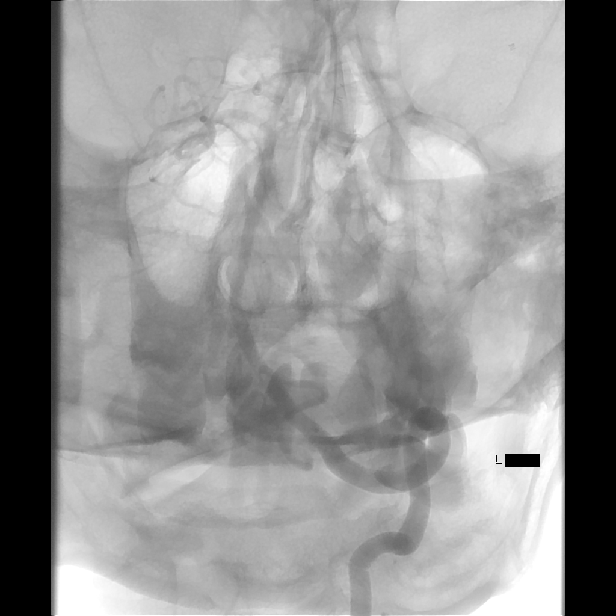
[im 10/111]
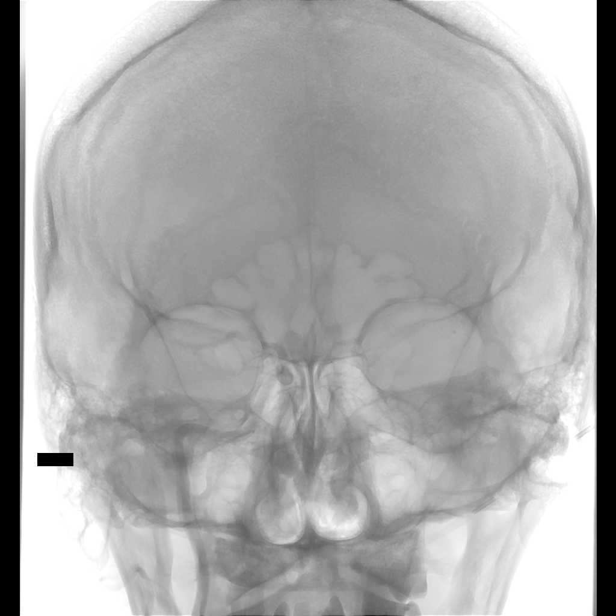
[im 20/111]
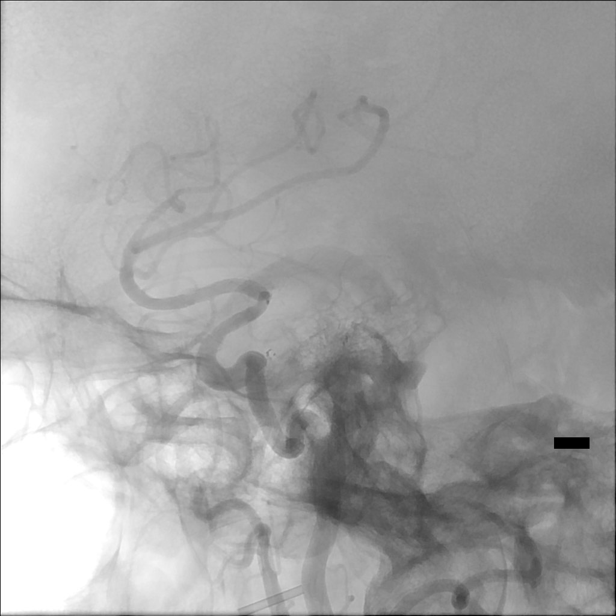
[im 29/111]
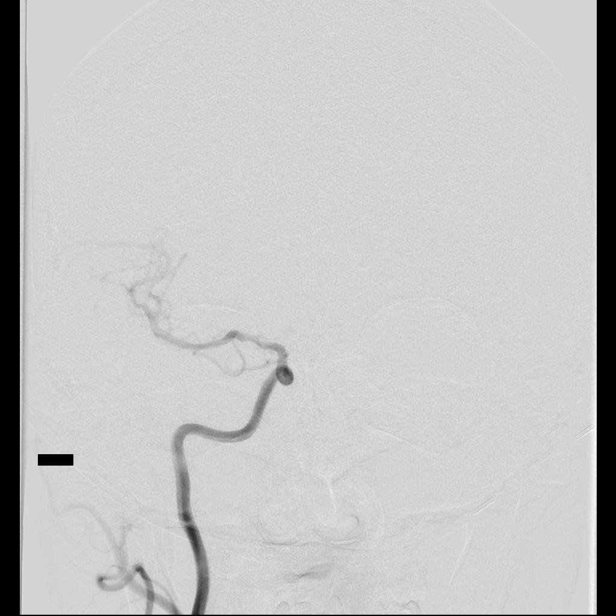
[im 39/111]
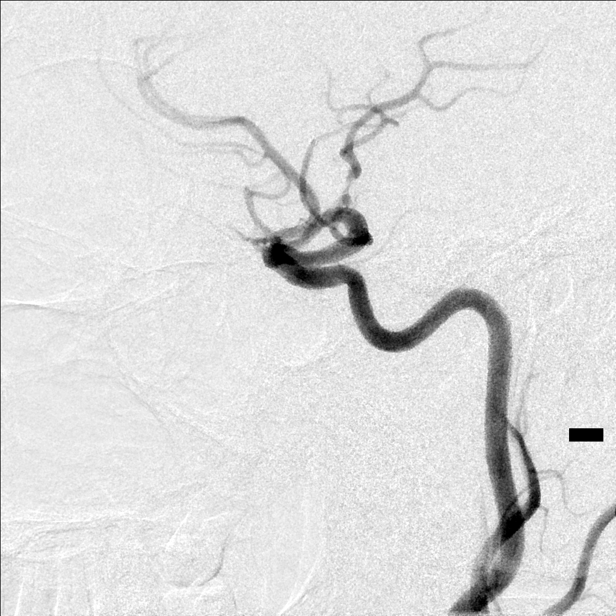
[im 48/111]
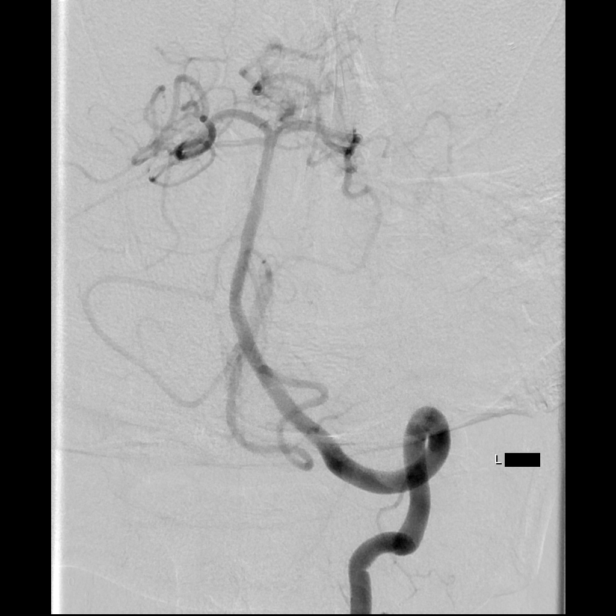
[im 58/111]
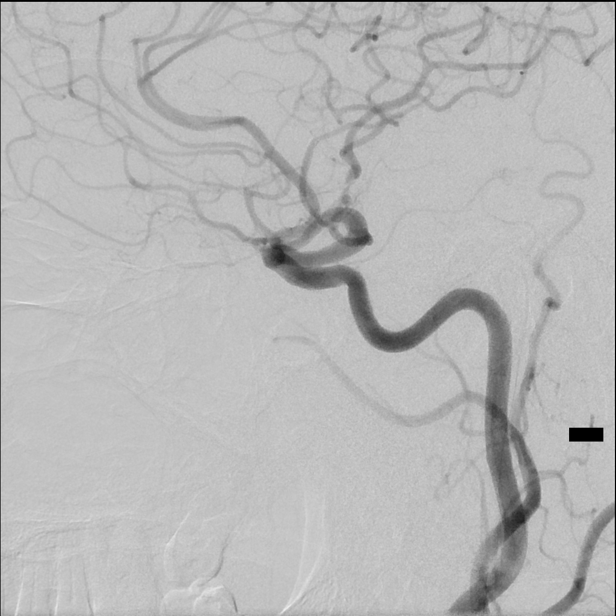
[im 63/111]
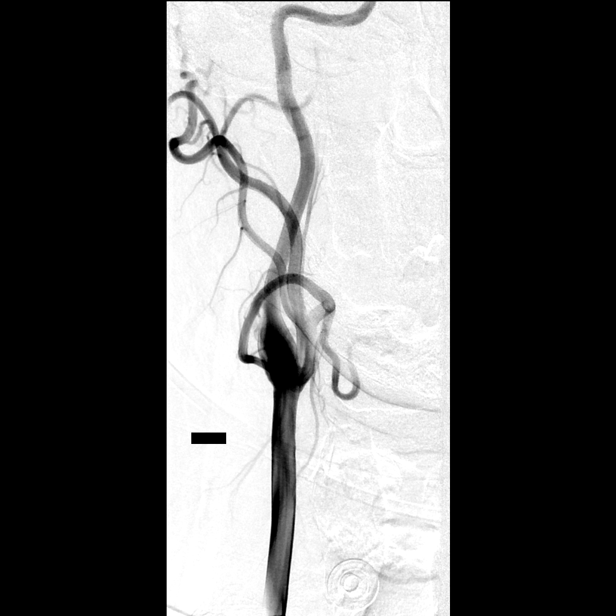
[im 72/111]
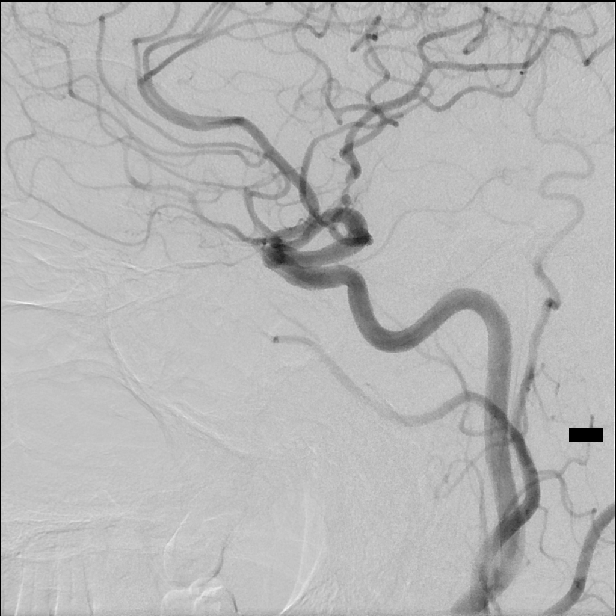
[im 82/111]
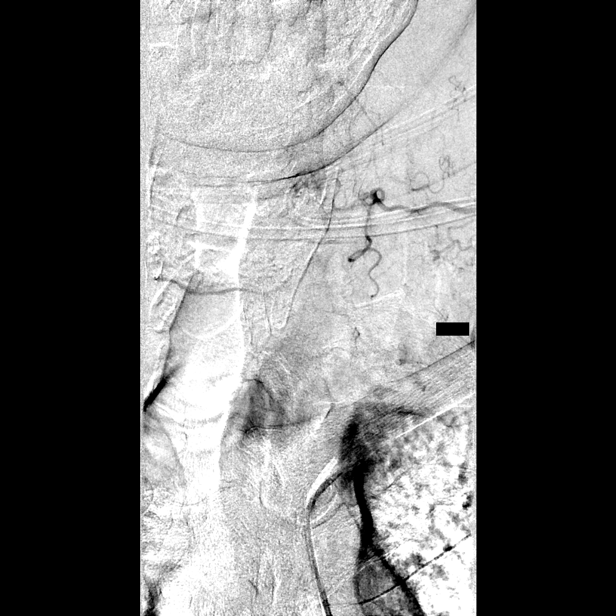
[im 91/111]
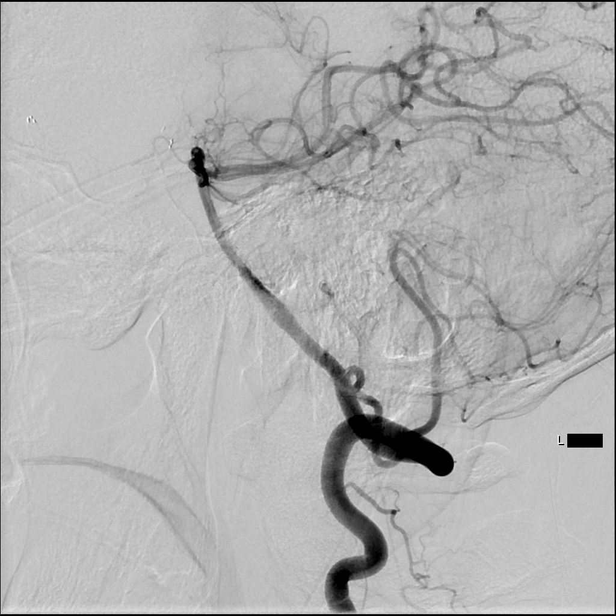
[im 101/111]
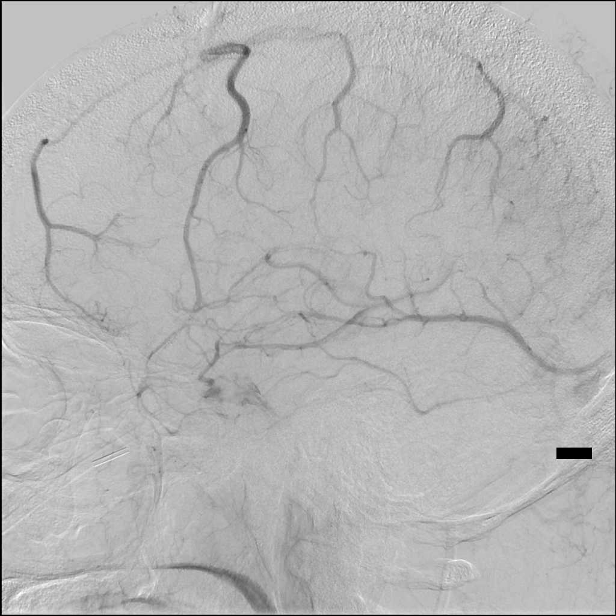
[im 111/111]
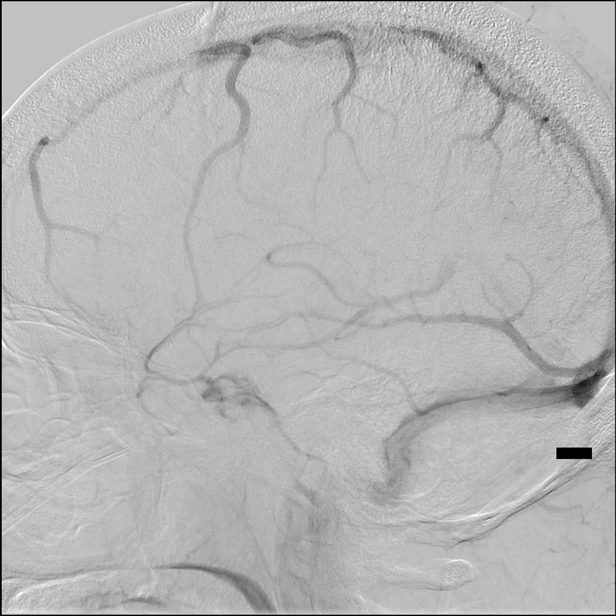

[13 of 24 positions shown; findings below may reference images not displayed]

EXAM:
IR ANGIO VERTEBRAL SEL SUBCLAVIAN INNOMINATE UNI RIGHT MOD SED
BILATERAL COMMON CAROTID ARTERY AND BILATERAL VERTEBRAL ARTERY
ANGIOGRAMS.

ANESTHESIA/SEDATION:
Conscious sedation.

MEDICATIONS:
Versed 1 mg IV.  Fentanyl 25 mcg IV.

CONTRAST:  50 mg lOMNIPAQUE IOHEXOL 300 MG/ML  SOLN

PROCEDURE:
Following a full explanation of the procedure along with the
potential associated complications, an informed witnessed consent
was obtained.

The right groin was prepped and draped in the usual sterile fashion.
Thereafter using modified Seldinger technique, transfemoral access
into the right common femoral artery was obtained without
difficulty. Over a 0.035 inch guidewire a 5 French Pinnacle sheath
was inserted. Through this, and also over a 0.035 inch guidewire a 5
French JB1 catheter was advanced to the aortic arch region and
selectively positioned in the right subclavian artery, the right
common carotid artery, the left common carotid artery and the left
vertebral artery.

There were no acute complications. The patient tolerated the
procedure well.

COMPLICATIONS:
None immediate.
FINDINGS: The right common carotid arteriogram demonstrates the right external
carotid artery and its major branches to be widely patent.

The right internal carotid artery at the bulb to the cranial skull
base opacifies normally.

The petrous, the cavernous and the supraclinoid segments are widely
patent.

Flash filling of the right anterior cerebral artery proximally is
seen.

The right vertebral artery origin is widely patent.

The vessel is seen to opacify to the cranial skull base to opacify
the right posterior inferior cerebellar artery and the right
vertebrobasilar junction.

The opacified portions of the basilar artery, the superior
cerebellar arteries and the posterior cerebral arteries is grossly
normal into the delayed arterial phase.

The left vertebral artery origin is normal.

The vessel is seen to opacify normally to the cranial skull base.
Normal opacification is seen of the left posterior inferior
cerebellar artery and left vertebrobasilar junction.

The basilar artery, the posterior cerebral arteries, the superior
cerebellar arteries and the anterior-inferior cerebellar arteries is
normal into the capillary and venous phases.

The left common carotid arteriogram demonstrates the left external
carotid artery and its major branches to be widely patent.

The left internal carotid artery at the bulb to the cranial skull
base opacifies normally.

The petrous, cavernous segments and the supraclinoid segments are
widely patent.

The left middle cerebral artery is seen to opacify normally into the
capillary and venous phases. Cross filling of the right anterior
cerebral artery A2 segment and distally is seen from the left
internal carotid artery via the anterior communicating artery.

The left middle cerebral artery and its stented portion extending
into the left MCA inferior division is patent. This has a uniform
approximately 50% narrowing within the stented segment. There is now
revascularization of the superior division with approximately 50%
narrowing in its proximal aspect.

Distal runoff into the left MCA distribution is seen.
IMPRESSION: Interval recanalization with vascularization of the left MCA
proximally, in the superior and inferior divisions. Uniform
narrowing within the stented segment of left middle cerebral artery
extending into the inferior division.

Approximately 50% focal stenosis of the proximal aspect of the
superior division of the left middle cerebral artery.

The angiographic findings were reviewed with the patient and the
family.

Given the revascularization of the occluded left middle cerebral
artery, it was felt the patient ought to continue with aspirin and
Plavix for at least 6 months. A follow-up catheter angiogram will be
undertaken in 6 months time. The patient leaves with good
understanding and agreement with the above management plan.

## 2016-04-10 ENCOUNTER — Other Ambulatory Visit: Payer: Self-pay | Admitting: Cardiology

## 2016-04-22 DIAGNOSIS — Z299 Encounter for prophylactic measures, unspecified: Secondary | ICD-10-CM | POA: Diagnosis not present

## 2016-04-22 DIAGNOSIS — Z6821 Body mass index (BMI) 21.0-21.9, adult: Secondary | ICD-10-CM | POA: Diagnosis not present

## 2016-04-22 DIAGNOSIS — I429 Cardiomyopathy, unspecified: Secondary | ICD-10-CM | POA: Diagnosis not present

## 2016-04-22 DIAGNOSIS — F329 Major depressive disorder, single episode, unspecified: Secondary | ICD-10-CM | POA: Diagnosis not present

## 2016-04-22 DIAGNOSIS — I10 Essential (primary) hypertension: Secondary | ICD-10-CM | POA: Diagnosis not present

## 2016-04-22 DIAGNOSIS — Z6829 Body mass index (BMI) 29.0-29.9, adult: Secondary | ICD-10-CM | POA: Diagnosis not present

## 2016-04-22 DIAGNOSIS — M1990 Unspecified osteoarthritis, unspecified site: Secondary | ICD-10-CM | POA: Diagnosis not present

## 2016-04-22 DIAGNOSIS — I69359 Hemiplegia and hemiparesis following cerebral infarction affecting unspecified side: Secondary | ICD-10-CM | POA: Diagnosis not present

## 2016-04-22 DIAGNOSIS — I509 Heart failure, unspecified: Secondary | ICD-10-CM | POA: Diagnosis not present

## 2016-04-22 DIAGNOSIS — K219 Gastro-esophageal reflux disease without esophagitis: Secondary | ICD-10-CM | POA: Diagnosis not present

## 2016-04-22 DIAGNOSIS — F419 Anxiety disorder, unspecified: Secondary | ICD-10-CM | POA: Diagnosis not present

## 2016-04-22 DIAGNOSIS — Z789 Other specified health status: Secondary | ICD-10-CM | POA: Diagnosis not present

## 2016-08-12 DIAGNOSIS — I509 Heart failure, unspecified: Secondary | ICD-10-CM | POA: Diagnosis not present

## 2016-08-12 DIAGNOSIS — I10 Essential (primary) hypertension: Secondary | ICD-10-CM | POA: Diagnosis not present

## 2016-08-12 DIAGNOSIS — F329 Major depressive disorder, single episode, unspecified: Secondary | ICD-10-CM | POA: Diagnosis not present

## 2016-08-12 DIAGNOSIS — Z299 Encounter for prophylactic measures, unspecified: Secondary | ICD-10-CM | POA: Diagnosis not present

## 2016-08-12 DIAGNOSIS — I69359 Hemiplegia and hemiparesis following cerebral infarction affecting unspecified side: Secondary | ICD-10-CM | POA: Diagnosis not present

## 2016-08-12 DIAGNOSIS — I429 Cardiomyopathy, unspecified: Secondary | ICD-10-CM | POA: Diagnosis not present

## 2016-08-12 DIAGNOSIS — I639 Cerebral infarction, unspecified: Secondary | ICD-10-CM | POA: Diagnosis not present

## 2016-08-12 DIAGNOSIS — G8929 Other chronic pain: Secondary | ICD-10-CM | POA: Diagnosis not present

## 2016-08-12 DIAGNOSIS — Z79899 Other long term (current) drug therapy: Secondary | ICD-10-CM | POA: Diagnosis not present

## 2016-08-12 DIAGNOSIS — Z6829 Body mass index (BMI) 29.0-29.9, adult: Secondary | ICD-10-CM | POA: Diagnosis not present

## 2016-09-25 DIAGNOSIS — I429 Cardiomyopathy, unspecified: Secondary | ICD-10-CM | POA: Diagnosis not present

## 2016-09-25 DIAGNOSIS — R7301 Impaired fasting glucose: Secondary | ICD-10-CM | POA: Diagnosis not present

## 2016-09-25 DIAGNOSIS — R7303 Prediabetes: Secondary | ICD-10-CM | POA: Diagnosis not present

## 2016-09-25 DIAGNOSIS — I69359 Hemiplegia and hemiparesis following cerebral infarction affecting unspecified side: Secondary | ICD-10-CM | POA: Diagnosis not present

## 2016-09-25 DIAGNOSIS — Z299 Encounter for prophylactic measures, unspecified: Secondary | ICD-10-CM | POA: Diagnosis not present

## 2016-09-25 DIAGNOSIS — I509 Heart failure, unspecified: Secondary | ICD-10-CM | POA: Diagnosis not present

## 2016-09-25 DIAGNOSIS — I10 Essential (primary) hypertension: Secondary | ICD-10-CM | POA: Diagnosis not present

## 2016-09-25 DIAGNOSIS — Z713 Dietary counseling and surveillance: Secondary | ICD-10-CM | POA: Diagnosis not present

## 2016-09-25 DIAGNOSIS — F329 Major depressive disorder, single episode, unspecified: Secondary | ICD-10-CM | POA: Diagnosis not present

## 2016-09-25 DIAGNOSIS — Z6829 Body mass index (BMI) 29.0-29.9, adult: Secondary | ICD-10-CM | POA: Diagnosis not present
